# Patient Record
Sex: Female | Born: 1995 | Race: White | Hispanic: No | Marital: Single | Smoking: Former smoker
Health system: Southern US, Community
[De-identification: ages and names within clinical notes are randomized; demographics above are authoritative.]

## PROBLEM LIST (undated history)

## (undated) ENCOUNTER — Emergency Department (HOSPITAL_COMMUNITY): Admission: EM | Payer: Self-pay | Source: Home / Self Care

## (undated) DIAGNOSIS — E063 Autoimmune thyroiditis: Secondary | ICD-10-CM

## (undated) DIAGNOSIS — R7303 Prediabetes: Secondary | ICD-10-CM

## (undated) DIAGNOSIS — R1013 Epigastric pain: Secondary | ICD-10-CM

## (undated) DIAGNOSIS — O24419 Gestational diabetes mellitus in pregnancy, unspecified control: Secondary | ICD-10-CM

## (undated) DIAGNOSIS — R232 Flushing: Secondary | ICD-10-CM

## (undated) DIAGNOSIS — E049 Nontoxic goiter, unspecified: Secondary | ICD-10-CM

## (undated) DIAGNOSIS — J45909 Unspecified asthma, uncomplicated: Secondary | ICD-10-CM

## (undated) DIAGNOSIS — E039 Hypothyroidism, unspecified: Secondary | ICD-10-CM

## (undated) DIAGNOSIS — L906 Striae atrophicae: Secondary | ICD-10-CM

## (undated) HISTORY — DX: Gestational diabetes mellitus in pregnancy, unspecified control: O24.419

## (undated) HISTORY — PX: NO PAST SURGERIES: SHX2092

## (undated) HISTORY — DX: Epigastric pain: R10.13

## (undated) HISTORY — DX: Flushing: R23.2

## (undated) HISTORY — DX: Hypothyroidism, unspecified: E03.9

## (undated) HISTORY — DX: Striae atrophicae: L90.6

## (undated) HISTORY — DX: Unspecified asthma, uncomplicated: J45.909

## (undated) HISTORY — DX: Prediabetes: R73.03

## (undated) HISTORY — DX: Autoimmune thyroiditis: E06.3

## (undated) HISTORY — DX: Nontoxic goiter, unspecified: E04.9

---
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
## 2017-06-08 ENCOUNTER — Emergency Department (HOSPITAL_COMMUNITY)
Admission: EM | Admit: 2017-06-08 | Discharge: 2017-06-08 | Disposition: A | Discharge status: Home / Self Care | Payer: Self-pay | Attending: Emergency Medicine | Admitting: Emergency Medicine

## 2017-06-08 ENCOUNTER — Encounter (HOSPITAL_COMMUNITY): Payer: Self-pay

## 2017-06-08 ENCOUNTER — Other Ambulatory Visit: Payer: Self-pay

## 2017-06-08 DIAGNOSIS — F17210 Nicotine dependence, cigarettes, uncomplicated: Secondary | ICD-10-CM | POA: Insufficient documentation

## 2017-06-08 DIAGNOSIS — R69 Illness, unspecified: Secondary | ICD-10-CM

## 2017-06-08 DIAGNOSIS — R110 Nausea: Secondary | ICD-10-CM | POA: Insufficient documentation

## 2017-06-08 DIAGNOSIS — R070 Pain in throat: Secondary | ICD-10-CM | POA: Insufficient documentation

## 2017-06-08 DIAGNOSIS — Z79899 Other long term (current) drug therapy: Secondary | ICD-10-CM | POA: Insufficient documentation

## 2017-06-08 DIAGNOSIS — R509 Fever, unspecified: Secondary | ICD-10-CM | POA: Insufficient documentation

## 2017-06-08 DIAGNOSIS — R0789 Other chest pain: Secondary | ICD-10-CM | POA: Insufficient documentation

## 2017-06-08 DIAGNOSIS — J111 Influenza due to unidentified influenza virus with other respiratory manifestations: Secondary | ICD-10-CM | POA: Insufficient documentation

## 2017-06-08 NOTE — Discharge Instructions (Signed)
Take Tylenol or Advil as directed for temperature higher than 100.4 or for aches.Make sure that you drink at least six 8 ounce glasses of water each day in order to stay well-hydrated.  [facility] tomorrow to get a primary care physician  and to arrange to be seen if not continue to improve or if you still have fever in 2 or 3 days.  Ask your new primary care physician to help you to stop smoking

## 2017-06-08 NOTE — ED Provider Notes (Signed)
[facility] EMERGENCY DEPARTMENT Provider Note   CSN: [id number] Arrival date & time: 06/08/17  1436     History   Chief Complaint Chief Complaint  Patient presents with  . Cough    HPI Gloria Cox is a 22 y.o. female.  HPI Complains of sore throat cough nausea and fever  onset 3 days ago.  She feels improved today over yesterday.  She complains of chest pain when she coughs.  She denies shortness of breath.  She is not nauseated presently.  She  treated herself with  approximately an hour prior to coming here.  Maximum temperature approximately 101 degrees.  No other associated symptoms.  No vomiting.  Chest pain is worse with coughing.  No chest pain when she does not cough. Past Medical History:  Diagnosis Date  . Dyspepsia   . Goiter   . Plethora   . Pre-diabetes   . Striae   . Thyroiditis, autoimmune     Patient Active Problem List   Diagnosis Date Noted  . Transaminitis 12/17/2015  . Cholelithiasis 12/17/2015  . RUQ abdominal pain 12/17/2015  . Cholelithiases 12/17/2015  . 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 Migraine without aura and without status migrainosus, not intractable 02/14/2015  . Tension headache 02/14/2015  . Presence of subdermal contraceptive implant 11/28/2014  . Adjustment disorder with mixed anxiety and depressed mood 07/18/2014  . Juvenile mammary hypertrophy 04/24/2014  . Macromastia 02/20/2014  . Hyperlipemia 05/09/2013  . Dysthymia 08/19/2011  . Morbid obesity (HCC) 04/20/2011  . Pre-diabetes 07/07/2010    Past Surgical History:  Procedure Laterality Date  . CHOLECYSTECTOMY N/A 12/18/2015   Procedure: LAPAROSCOPIC CHOLECYSTECTOMY;  Surgeon: Emelia Loron, MD;  Location: [facility];  Service: General;  Laterality: N/A;  . NO PAST SURGERIES    . WISDOM TOOTH EXTRACTION  2014    OB History    No data available       Home Medications    Prior to Admission medications   Medication Sig Start Date End Date  Taking? Authorizing Provider  etonogestrel (NEXPLANON) 68 MG IMPL implant 1 each by Subdermal route once. Placed 04/2014    [provider]  ibuprofen (ADVIL,MOTRIN) 800 MG tablet Take 1 tablet (800 mg total) by mouth every 8 (eight) hours as needed. 12/19/16   Tilden Fossa, MD  budesonide (PULMICORT) 180 MCG/ACT inhaler Inhale 2 puffs into the lungs as needed.  06/14/11  [provider]  loratadine (CLARITIN) 10 MG tablet Take 10 mg by mouth daily.  06/14/11  [provider]    Family History Family History  Problem Relation Age of Onset  . Obesity Mother   . Obesity Sister   . Obesity Brother   . Obesity Maternal Grandmother   . Heart Problems Maternal Grandfather 24    Social History Social History   Tobacco Use  . Smoking status: Current Some Day Smoker    Packs/day: 0.10    Types: Cigarettes  . Smokeless tobacco: Never Used  Substance Use Topics  . Alcohol use: No  . Drug use: No     Allergies   Patient has no known allergies.   Review of Systems Review of Systems  Constitutional: Positive for fever.  Respiratory: Positive for cough.   Cardiovascular: Positive for chest pain.  Gastrointestinal: Positive for nausea.  Genitourinary:       Menses irregular  Musculoskeletal: Positive for myalgias.  All other systems reviewed and are negative.    Physical Exam  Updated Vital Signs BP 123/68 (BP Location: Right Arm)   Pulse 75   Temp 98.6 F (37 C) (Oral)   Resp 16   Ht 5\' 3"  (1.6 m)   Wt 88.5 kg (195 lb)   LMP 05/18/2017   SpO2 97%   BMI 34.54 kg/m   Physical Exam  Constitutional: She appears well-developed and well-nourished. No distress.  HENT:  Head: Normocephalic and atraumatic.  Right Ear: External ear normal.  Left Ear: External ear normal.  Oropharynx minimally reddened.  Mucous membranes moist  Eyes: Conjunctivae are normal. Pupils are equal, round, and reactive to light.  Neck: Neck supple. No tracheal deviation  present. No thyromegaly present.  Cardiovascular: Normal rate and regular rhythm.  No murmur heard. Pulmonary/Chest: Effort normal and breath sounds normal.  Abdominal: Soft. Bowel sounds are normal. She exhibits no distension. There is no tenderness.  Obese  Musculoskeletal: Normal range of motion. She exhibits no edema or tenderness.  Neurological: She is alert. Coordination normal.  Skin: Skin is warm and dry. No rash noted.  Psychiatric: She has a normal mood and affect.  Nursing note and vitals reviewed.    ED Treatments / Results  Labs (all labs ordered are listed, but only abnormal results are displayed) Labs Reviewed - No data to display  EKG  EKG Interpretation None       Radiology No results found.  Procedures Procedures (including critical care time)  Medications Ordered in ED Medications - No data to display   Initial Impression / Assessment and Plan / ED Course  I have reviewed the triage vital signs and the nursing notes.  Pertinent labs & imaging results that were available during my care of the patient were reviewed by me and considered in my medical decision making (see chart for details).     Patient with influenza-like illness clinically.  Further diagnostic testing not indicated.  Plan Tylenol or Advil for aches or for temperature.  I counseled patient for 5 minutes on smoking cessation.  [facility]erral [facility]  Final Clinical Impressions(s) / ED Diagnoses  Diagnosis #1 influenza-like illness #2 tobacco abuse Final diagnoses:  None    ED Discharge Orders    None       Doug SouJacubowitz, Itai Barbian, MD 06/08/17 [phone]

## 2017-06-08 NOTE — ED Triage Notes (Signed)
Patient c/o cough, fever to 100, nausea x 4days - improving today.  Patient states she vomited yesterday but denies vomiting today.  Patient states "I feel better today but I needed a note for work".

## 2017-08-16 ENCOUNTER — Emergency Department (HOSPITAL_COMMUNITY)
Admission: EM | Admit: 2017-08-16 | Discharge: 2017-08-16 | Disposition: A | Discharge status: Home / Self Care | Payer: Self-pay | Attending: Emergency Medicine | Admitting: Emergency Medicine

## 2017-08-16 ENCOUNTER — Encounter (HOSPITAL_COMMUNITY): Payer: Self-pay

## 2017-08-16 DIAGNOSIS — F17210 Nicotine dependence, cigarettes, uncomplicated: Secondary | ICD-10-CM | POA: Insufficient documentation

## 2017-08-16 DIAGNOSIS — A549 Gonococcal infection, unspecified: Secondary | ICD-10-CM | POA: Insufficient documentation

## 2017-08-16 DIAGNOSIS — Z79899 Other long term (current) drug therapy: Secondary | ICD-10-CM | POA: Insufficient documentation

## 2017-08-16 DIAGNOSIS — N390 Urinary tract infection, site not specified: Secondary | ICD-10-CM | POA: Insufficient documentation

## 2017-08-16 MED ORDER — DOXYCYCLINE HYCLATE 100 MG PO CAPS: 100.0000 mg | ORAL_CAPSULE | Freq: Two times a day (BID) | ORAL | 0 refills | Status: DC

## 2017-08-16 MED ORDER — CEPHALEXIN 500 MG PO CAPS: 500.0000 mg | ORAL_CAPSULE | Freq: Three times a day (TID) | ORAL | 0 refills | Status: DC

## 2017-08-16 NOTE — ED Triage Notes (Signed)
PT states she was seen at [facility] last week and dx with BV, gonorrhea, and received phone call yesterday and was told she also had a bad UTI. IT states she is still having bad back and lower abdomen pain. Denies n/v.

## 2017-08-16 NOTE — Discharge Instructions (Addendum)
Take both antibiotics as prescribed until all gone.  Please follow-up after he finished antibiotics if continue to have pain.

## 2017-08-16 NOTE — ED Provider Notes (Signed)
[facility] EMERGENCY DEPARTMENT Provider Note   CSN: [id number] Arrival date & time: 08/16/17  1404     History   Chief Complaint Chief Complaint  Patient presents with  . Urinary Tract Infection    HPI Gloria Cox is a 22 y.o. female.  HPI Gloria Cox is a 22 y.o. female presents to ED with complaint of pelvic pain and back pain.  Patient states she has had pelvic pain for several weeks.  She went to [facility] urgent care last week, had pelvic exam and urinalysis done which she states that she was called back and told she tested positive for gonorrhea and had a urinary tract infection.  She received a shot of Rocephin, but did not fill her prescription for doxycycline and antibiotic she was given for UTI.  She states she still having suprapubic pain that radiates into the back.  She denies any fever or chills.  No nausea or vomiting.  Denies any urinary symptoms.  Denies any vaginal symptoms.  Denies any other complaints.  Past Medical History:  Diagnosis Date  . Dyspepsia   . Goiter   . Plethora   . Pre-diabetes   . Striae   . Thyroiditis, autoimmune     Patient Active Problem List   Diagnosis Date Noted  . Transaminitis 12/17/2015  . Cholelithiasis 12/17/2015  . RUQ abdominal pain 12/17/2015  . Cholelithiases 12/17/2015  . 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 Migraine without aura and without status migrainosus, not intractable 02/14/2015  . Tension headache 02/14/2015  . Presence of subdermal contraceptive implant 11/28/2014  . Adjustment disorder with mixed anxiety and depressed mood 07/18/2014  . Juvenile mammary hypertrophy 04/24/2014  . Macromastia 02/20/2014  . Hyperlipemia 05/09/2013  . Dysthymia 08/19/2011  . Morbid obesity (HCC) 04/20/2011  . Pre-diabetes 07/07/2010    Past Surgical History:  Procedure Laterality Date  . CHOLECYSTECTOMY N/A 12/18/2015   Procedure: LAPAROSCOPIC CHOLECYSTECTOMY;  Surgeon: Emelia Loron, MD;  Location: [facility]S;  Service: General;  Laterality: N/A;  . NO PAST SURGERIES    . WISDOM TOOTH EXTRACTION  2014     OB History   None      Home Medications    Prior to Admission medications   Medication Sig Start Date End Date Taking? Authorizing Provider  etonogestrel (NEXPLANON) 68 MG IMPL implant 1 each by Subdermal route once. Placed 04/2014    [provider]  ibuprofen (ADVIL,MOTRIN) 800 MG tablet Take 1 tablet (800 mg total) by mouth every 8 (eight) hours as needed. 12/19/16   Tilden Fossa, MD  budesonide (PULMICORT) 180 MCG/ACT inhaler Inhale 2 puffs into the lungs as needed.  06/14/11  [provider]  loratadine (CLARITIN) 10 MG tablet Take 10 mg by mouth daily.  06/14/11  [provider]    Family History Family History  Problem Relation Age of Onset  . Obesity Mother   . Obesity Sister   . Obesity Brother   . Obesity Maternal Grandmother   . Heart Problems Maternal Grandfather 29    Social History Social History   Tobacco Use  . Smoking status: Current Some Day Smoker    Packs/day: 0.10    Types: Cigarettes  . Smokeless tobacco: Never Used  Substance Use Topics  . Alcohol use: No  . Drug use: No     Allergies   Patient has no known allergies.   Review of Systems Review of Systems  Constitutional: Negative for chills  and fever.  Respiratory: Negative for cough, chest tightness and shortness of breath.   Cardiovascular: Negative for chest pain, palpitations and leg swelling.  Gastrointestinal: Positive for abdominal pain. Negative for diarrhea, nausea and vomiting.  Genitourinary: Positive for flank pain. Negative for dysuria, pelvic pain, vaginal bleeding, vaginal discharge and vaginal pain.  Musculoskeletal: Negative for arthralgias, myalgias, neck pain and neck stiffness.  Skin: Negative for rash.  Neurological: Negative for dizziness, weakness and headaches.  All other systems reviewed and are  negative.    Physical Exam Updated Vital Signs BP 123/78 (BP Location: Right Arm)   Pulse 69   Temp 98.6 F (37 C) (Oral)   Resp 16   SpO2 100%   Physical Exam  Constitutional: She appears well-developed and well-nourished. No distress.  Eyes: Conjunctivae are normal.  Neck: Neck supple.  Cardiovascular: Normal rate.  Pulmonary/Chest: Effort normal and breath sounds normal.  Abdominal: She exhibits no distension. There is no tenderness. There is no guarding.  No CVA tenderness  Neurological: She is alert.  Skin: Skin is warm and dry.  Nursing note and vitals reviewed.    ED Treatments / Results  Labs (all labs ordered are listed, but only abnormal results are displayed) Labs Reviewed - No data to display  EKG None  Radiology No results found.  Procedures Procedures (including critical care time)  Medications Ordered in ED Medications - No data to display   Initial Impression / Assessment and Plan / ED Course  I have reviewed the triage vital signs and the nursing notes.  Pertinent labs & imaging results that were available during my care of the patient were reviewed by me and considered in my medical decision making (see chart for details).     Patient continues to have suprapubic pain, recently diagnosed with gonorrhea and UTI.  I reviewed the cultures, grew E. coli, sensitive to all antibiotics.  Also did not take her medication as was prescribed for her gonorrhea.  She is in no acute distress, afebrile, nontoxic-appearing.  Vital signs are normal.  Abdomen is nontender.  No CVA tenderness, I am not concerned about pyelonephritis at this time.  I will prescribe her again doxycycline, and give her a coupon that she can afford, will also prescribe Keflex for her UTI.  We will have her follow-up after she finishes antibiotics.  Return precautions discussed.  Vitals:   08/16/17 1542  BP: 123/78  Pulse: 69  Resp: 16  Temp: 98.6 F (37 C)  TempSrc: Oral   SpO2: 100%    Final Clinical Impressions(s) / ED Diagnoses   Final diagnoses:  Lower urinary tract infectious disease  Gonorrhea    ED Discharge Orders    None       Iona Coach 08/16/17 2037    Marily Memos, MD 08/16/17 2138

## 2017-10-11 ENCOUNTER — Emergency Department (HOSPITAL_COMMUNITY): Payer: Self-pay

## 2017-10-11 ENCOUNTER — Encounter (HOSPITAL_COMMUNITY): Payer: Self-pay | Admitting: Emergency Medicine

## 2017-10-11 ENCOUNTER — Emergency Department (HOSPITAL_COMMUNITY)
Admission: EM | Admit: 2017-10-11 | Discharge: 2017-10-11 | Disposition: A | Discharge status: Home / Self Care | Payer: Self-pay | Attending: Emergency Medicine | Admitting: Emergency Medicine

## 2017-10-11 DIAGNOSIS — R112 Nausea with vomiting, unspecified: Secondary | ICD-10-CM | POA: Insufficient documentation

## 2017-10-11 DIAGNOSIS — F17210 Nicotine dependence, cigarettes, uncomplicated: Secondary | ICD-10-CM | POA: Insufficient documentation

## 2017-10-11 DIAGNOSIS — R197 Diarrhea, unspecified: Secondary | ICD-10-CM | POA: Insufficient documentation

## 2017-10-11 DIAGNOSIS — Z79899 Other long term (current) drug therapy: Secondary | ICD-10-CM | POA: Insufficient documentation

## 2017-10-11 DIAGNOSIS — I880 Nonspecific mesenteric lymphadenitis: Secondary | ICD-10-CM | POA: Insufficient documentation

## 2017-10-11 LAB — URINALYSIS, ROUTINE W REFLEX MICROSCOPIC
BILIRUBIN URINE: NEGATIVE
GLUCOSE, UA: NEGATIVE mg/dL
HGB URINE DIPSTICK: NEGATIVE
KETONES UR: NEGATIVE mg/dL
LEUKOCYTES UA: NEGATIVE
Nitrite: NEGATIVE
PH: 5 (ref 5.0–8.0)
PROTEIN: NEGATIVE mg/dL
Specific Gravity, Urine: 1.024 (ref 1.005–1.030)

## 2017-10-11 LAB — WET PREP, GENITAL
Clue Cells Wet Prep HPF POC: NONE SEEN
Sperm: NONE SEEN
Trich, Wet Prep: NONE SEEN
YEAST WET PREP: NONE SEEN

## 2017-10-11 LAB — I-STAT BETA HCG BLOOD, ED (MC, WL, AP ONLY): I-stat hCG, quantitative: <5 m[IU]/mL (ref <5)

## 2017-10-11 LAB — COMPREHENSIVE METABOLIC PANEL
ALBUMIN: 3.9 g/dL (ref 3.5–5.0)
ALT: 15 U/L (ref 0–44)
AST: 19 U/L (ref 15–41)
Alkaline Phosphatase: 66 U/L (ref 38–126)
Anion gap: 10 (ref 5–15)
BILIRUBIN TOTAL: 0.3 mg/dL (ref 0.3–1.2)
BUN: 8 mg/dL (ref 6–20)
CHLORIDE: 106 mmol/L (ref 98–111)
CO2: 24 mmol/L (ref 22–32)
CREATININE: 0.57 mg/dL (ref 0.44–1.00)
Calcium: 9 mg/dL (ref 8.9–10.3)
GFR calc Af Amer: >60 mL/min (ref >60)
GLUCOSE: 104 mg/dL — AB (ref 70–99)
Potassium: 3.8 mmol/L (ref 3.5–5.1)
Sodium: 140 mmol/L (ref 135–145)
Total Protein: 7.5 g/dL (ref 6.5–8.1)

## 2017-10-11 LAB — CBC
HEMATOCRIT: 41.9 % (ref 36.0–46.0)
Hemoglobin: 14.1 g/dL (ref 12.0–15.0)
MCH: 29.7 pg (ref 26.0–34.0)
MCHC: 33.7 g/dL (ref 30.0–36.0)
MCV: 88.2 fL (ref 78.0–100.0)
PLATELETS: 172 10*3/uL (ref 150–400)
RBC: 4.75 MIL/uL (ref 3.87–5.11)
RDW: 12.3 % (ref 11.5–15.5)
WBC: 6.4 10*3/uL (ref 4.0–10.5)

## 2017-10-11 LAB — LIPASE, BLOOD: LIPASE: 21 U/L (ref 11–51)

## 2017-10-11 LAB — GROUP A STREP BY PCR: GROUP A STREP BY PCR: NOT DETECTED

## 2017-10-11 MED ORDER — FAMOTIDINE IN NACL 20-0.9 MG/50ML-% IV SOLN
20.0000 mg | Freq: Once | INTRAVENOUS | Status: AC
  Administered 2017-10-11: 20 mg via INTRAVENOUS
  Filled 2017-10-11: qty 50

## 2017-10-11 MED ORDER — OXYCODONE-ACETAMINOPHEN 5-325 MG PO TABS: 1.0000 | ORAL_TABLET | Freq: Four times a day (QID) | ORAL | 0 refills | Status: DC | PRN

## 2017-10-11 MED ORDER — MORPHINE SULFATE (PF) 4 MG/ML IV SOLN
4.0000 mg | Freq: Once | INTRAVENOUS | Status: AC
  Administered 2017-10-11: 4 mg via INTRAVENOUS
  Filled 2017-10-11: qty 1

## 2017-10-11 MED ORDER — ONDANSETRON HCL 4 MG/2ML IJ SOLN
4.0000 mg | Freq: Once | INTRAMUSCULAR | Status: AC
  Administered 2017-10-11: 4 mg via INTRAVENOUS
  Filled 2017-10-11: qty 2

## 2017-10-11 MED ORDER — DICYCLOMINE HCL 10 MG PO CAPS
20.0000 mg | ORAL_CAPSULE | Freq: Once | ORAL | Status: AC
  Administered 2017-10-11: 20 mg via ORAL
  Filled 2017-10-11: qty 2

## 2017-10-11 MED ORDER — IOPAMIDOL (ISOVUE-300) INJECTION 61%
INTRAVENOUS | Status: AC
  Filled 2017-10-11: qty 100

## 2017-10-11 MED ORDER — SODIUM CHLORIDE 0.9 % IV BOLUS
1000.0000 mL | Freq: Once | INTRAVENOUS | Status: AC
  Administered 2017-10-11: 1000 mL via INTRAVENOUS

## 2017-10-11 MED ORDER — ONDANSETRON 4 MG PO TBDP: 4.0000 mg | ORAL_TABLET | Freq: Three times a day (TID) | ORAL | 0 refills | Status: DC | PRN

## 2017-10-11 MED ORDER — NAPROXEN 500 MG PO TABS
500.0000 mg | ORAL_TABLET | Freq: Two times a day (BID) | ORAL | 0 refills | Status: DC
Start: 2017-10-11 — End: 2018-03-09

## 2017-10-11 MED ORDER — IOPAMIDOL (ISOVUE-300) INJECTION 61%
100.0000 mL | Freq: Once | INTRAVENOUS | Status: AC | PRN
  Administered 2017-10-11: 100 mL via INTRAVENOUS

## 2017-10-11 MED ORDER — GI COCKTAIL ~~LOC~~
30.0000 mL | Freq: Once | ORAL | Status: AC
  Administered 2017-10-11: 30 mL via ORAL
  Filled 2017-10-11: qty 30

## 2017-10-11 NOTE — ED Notes (Signed)
Urine culture sent to lab with UA sample 

## 2017-10-11 NOTE — Discharge Instructions (Signed)
You are seen in the emergency department today for abdominal pain and found to have mesenteric adenitis, please see the attached handout regarding this diagnosis, we suspect this is from the viral GI illness.  We are Sending you home with supportive care.  Please take naproxen every 12 hours for pain. Naproxen is a nonsteroidal anti-inflammatory medication that will help with pain and swelling. Be sure to take this medication as prescribed with food, 1 pill every 12 hours,  It should be taken with food, as it can cause stomach upset, and more seriously, stomach bleeding. Do not take other nonsteroidal anti-inflammatory medications with this such as Advil, Motrin, or Aleve.   We also send you home with a few tablets of Percocet, please only utilize these every 6 hours as needed for severe pain.  The redness medicine has Tylenol in it, therefore you will need to avoid exceeding the maximum dose of Tylenol should you decide to supplement with this.  Do not drive or operate heavy machinery when taking this medicine.  Do not mix this medicine with alcohol.  Keep this out of reach of small children.  We are also sending you home with a prescription for Zofran, take this every 8 hours as needed for nausea and vomiting.  We have prescribed you new medication(s) today. Discuss the medications prescribed today with your pharmacist as they can have adverse effects and interactions with your other medicines including over the counter and prescribed medications. Seek medical evaluation if you start to experience new or abnormal symptoms after taking one of these medicines, seek care immediately if you start to experience difficulty breathing, feeling of your throat closing, facial swelling, or rash as these could be indications of a more serious allergic reaction   Follow-up with your primary care provider within 3 days for reevaluation.  Return to the ER anytime for new or worsening symptoms such as worsening pain,  inability to keep fluids down, fever, blood in your stool, blood in vomit, or any other concerns.

## 2017-10-11 NOTE — ED Provider Notes (Signed)
[facility]-EMERGENCY DEPT Provider Note   CSN: [id number] Arrival date & time: 10/11/17  1219     History   Chief Complaint Chief Complaint  Patient presents with  . Emesis  . Diarrhea  . Abdominal Pain    HPI Gloria Cox is a 22 y.o. female with a hx of tobacco abuse, autoimmune thyroiditis, hyperlipidemia, obesity, and cholecystectomy who presents to the ED with complaints of N/V/D and abdominal pain for the past 4 days. Patient states she initially developed nausea then subsequently vomiting, diarrhea, and abdominal pain. Emesis and diarrhea are each non bloody, non mucousy. Reports approximately 4-5 episodes of vomiting daily and too numerous to count episodes of diarrhea daily. Her abdominal pain is described as generalized, crampy, worse with emesis and diarrhea, no specific alleviating factors. She has not been able to keep down food/fluids. She has also had some congestion, R ear pain, and sore throat as well. 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  She states she has also had some odorous vaginal discharge for about 1 week- she states she was treated for gonorrhea about 1 month ago with improvement in sxs, but now having some odorous discharge again, other than this tx no other recent abx. Reports she has irregular periods with nexplanon in place, currently having vaginal bleeding. She is sexually active. Denies chest pain, dyspnea, or dysuria. No recent tic bites or rashes.   HPI  Past Medical History:  Diagnosis Date  . Dyspepsia   . Goiter   . Plethora   . Pre-diabetes   . Striae   . Thyroiditis, autoimmune     Patient Active Problem List   Diagnosis Date Noted  . Transaminitis 12/17/2015  . Cholelithiasis 12/17/2015  . RUQ abdominal pain 12/17/2015  . Cholelithiases 12/17/2015  . 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 Migraine without aura and  without status migrainosus, not intractable 02/14/2015  . Tension headache 02/14/2015  . Presence of subdermal contraceptive implant 11/28/2014  . Adjustment disorder with mixed anxiety and depressed mood 07/18/2014  . Juvenile mammary hypertrophy 04/24/2014  . Macromastia 02/20/2014  . Hyperlipemia 05/09/2013  . Dysthymia 08/19/2011  . Morbid obesity (HCC) 04/20/2011  . Pre-diabetes 07/07/2010    Past Surgical History:  Procedure Laterality Date  . CHOLECYSTECTOMY N/A 12/18/2015   Procedure: LAPAROSCOPIC CHOLECYSTECTOMY;  Surgeon: Emelia Loron, MD;  Location: [facility];  Service: General;  Laterality: N/A;  . NO PAST SURGERIES    . WISDOM TOOTH EXTRACTION  2014     OB History   None      Home Medications    Prior to Admission medications   Medication Sig Start Date End Date Taking? Authorizing Provider  cephALEXin (KEFLEX) 500 MG capsule Take 1 capsule (500 mg total) by mouth 3 (three) times daily. 08/16/17   Kirichenko, Lemont Fillers, PA-C  doxycycline (VIBRAMYCIN) 100 MG capsule Take 1 capsule (100 mg total) by mouth 2 (two) times daily. 08/16/17   Kirichenko, Tatyana, PA-C  etonogestrel (NEXPLANON) 68 MG IMPL implant 1 each by Subdermal route once. Placed 04/2014    [provider]  ibuprofen (ADVIL,MOTRIN) 800 MG tablet Take 1 tablet (800 mg total) by mouth every 8 (eight) hours as needed. 12/19/16   Tilden Fossa, MD  budesonide (PULMICORT) 180 MCG/ACT inhaler Inhale 2 puffs into the lungs as needed.  06/14/11  [provider]  loratadine (CLARITIN) 10 MG tablet  Take 10 mg by mouth daily.  06/14/11  [provider]    Family History Family History  Problem Relation Age of Onset  . Obesity Mother   . Obesity Sister   . Obesity Brother   . Obesity Maternal Grandmother   . Heart Problems Maternal Grandfather 99    Social History Social History   Tobacco Use  . Smoking status: Current Some Day Smoker    Packs/day: 0.10    Types: Cigarettes    . Smokeless tobacco: Never Used  Substance Use Topics  . Alcohol use: No  . Drug use: No     Allergies   Patient has no known allergies.   Review of Systems Review of Systems  Constitutional: Positive for chills and fever.  HENT: Positive for congestion, ear pain and sore throat.   Respiratory: Negative for shortness of breath.   Cardiovascular: Negative for chest pain.  Gastrointestinal: Positive for abdominal pain, diarrhea, nausea and vomiting. Negative for blood in stool.  Genitourinary: Positive for vaginal bleeding and vaginal discharge. Negative for dysuria and flank pain.  Skin: Negative for rash.  All other systems reviewed and are negative.    Physical Exam Updated Vital Signs BP 123/77 (BP Location: Left Arm)   Pulse 82   Temp 98.2 F (36.8 C) (Oral)   Resp 16   Ht 5\' 3"  (1.6 m)   Wt 88.5 kg (195 lb)   LMP 09/26/2017   SpO2 100%   BMI 34.54 kg/m   Physical Exam  Constitutional: She appears well-developed and well-nourished.  Non-toxic appearance. No distress.  HENT:  Head: Normocephalic and atraumatic.  Right Ear: Tympanic membrane and ear canal normal. Tympanic membrane is not perforated, not erythematous, not retracted and not bulging.  Left Ear: Tympanic membrane and ear canal normal. Tympanic membrane is not perforated, not erythematous, not retracted and not bulging.  Nose: Mucosal edema (congestion) present.  Mouth/Throat: Uvula is midline. Posterior oropharyngeal erythema present. No oropharyngeal exudate.  Patient is tolerating her own secretions without difficulty. No trismus. No drooling. No hot potato voice. Submandibular compartment is soft.   Eyes: Pupils are equal, round, and reactive to light. Conjunctivae are normal. Right eye exhibits no discharge. Left eye exhibits no discharge.  Neck: Normal range of motion. Neck supple.  Cardiovascular: Normal rate and regular rhythm.  No murmur heard. Pulmonary/Chest: Effort normal and breath sounds  normal. No respiratory distress. She has no wheezes. She has no rales.  Abdominal: Soft. Bowel sounds are normal. She exhibits no distension. There is generalized tenderness (mild, non focal). There is no rigidity, no rebound, no guarding and no CVA tenderness.  Genitourinary: Pelvic exam was performed with patient supine. There is no lesion on the right labia. There is no lesion on the left labia. Cervix exhibits discharge (Mild white). Cervix exhibits no motion tenderness and no friability. Right adnexum displays no mass, no tenderness and no fullness. Left adnexum displays no mass, no tenderness and no fullness. There is bleeding (mild amount from cervical os) in the vagina.  Genitourinary Comments: EDT Fleet Contras present as chaperone.   Lymphadenopathy:    She has cervical adenopathy (mild anterior).  Neurological: She is alert.  Clear speech.   Skin: Skin is warm and dry. No rash noted.  Psychiatric: She has a normal mood and affect. Her behavior is normal.  Nursing note and vitals reviewed.   ED Treatments / Results  Labs Results for orders placed or performed during the hospital encounter of 10/11/17  Wet prep, genital  Result Value Ref Range   Yeast Wet Prep HPF POC NONE SEEN NONE SEEN   Trich, Wet Prep NONE SEEN NONE SEEN   Clue Cells Wet Prep HPF POC NONE SEEN NONE SEEN   WBC, Wet Prep HPF POC FEW (A) NONE SEEN   Sperm NONE SEEN   Group A Strep by PCR  Result Value Ref Range   Group A Strep by PCR NOT DETECTED NOT DETECTED  Lipase, blood  Result Value Ref Range   Lipase 21 11 - 51 U/L  Comprehensive metabolic panel  Result Value Ref Range   Sodium 140 135 - 145 mmol/L   Potassium 3.8 3.5 - 5.1 mmol/L   Chloride 106 98 - 111 mmol/L   CO2 24 22 - 32 mmol/L   Glucose, Bld 104 (H) 70 - 99 mg/dL   BUN 8 6 - 20 mg/dL   Creatinine, Ser 1.61 0.44 - 1.00 mg/dL   Calcium 9.0 8.9 - 09.6 mg/dL   Total Protein 7.5 6.5 - 8.1 g/dL   Albumin 3.9 3.5 - 5.0 g/dL   AST 19 15 - 41 U/L    ALT 15 0 - 44 U/L   Alkaline Phosphatase 66 38 - 126 U/L   Total Bilirubin 0.3 0.3 - 1.2 mg/dL   GFR calc non Af Amer >60 >60 mL/min   GFR calc Af Amer >60 >60 mL/min   Anion gap 10 5 - 15  CBC  Result Value Ref Range   WBC 6.4 4.0 - 10.5 K/uL   RBC 4.75 3.87 - 5.11 MIL/uL   Hemoglobin 14.1 12.0 - 15.0 g/dL   HCT 04.5 40.9 - 81.1 %   MCV 88.2 78.0 - 100.0 fL   MCH 29.7 26.0 - 34.0 pg   MCHC 33.7 30.0 - 36.0 g/dL   RDW 91.4 78.2 - 95.6 %   Platelets 172 150 - 400 K/uL  Urinalysis, Routine w reflex microscopic  Result Value Ref Range   Color, Urine YELLOW YELLOW   APPearance CLEAR CLEAR   Specific Gravity, Urine 1.024 1.005 - 1.030   pH 5.0 5.0 - 8.0   Glucose, UA NEGATIVE NEGATIVE mg/dL   Hgb urine dipstick NEGATIVE NEGATIVE   Bilirubin Urine NEGATIVE NEGATIVE   Ketones, ur NEGATIVE NEGATIVE mg/dL   Protein, ur NEGATIVE NEGATIVE mg/dL   Nitrite NEGATIVE NEGATIVE   Leukocytes, UA NEGATIVE NEGATIVE  I-Stat beta hCG blood, ED  Result Value Ref Range   I-stat hCG, quantitative <5.0 <5 mIU/mL   Comment 3            EKG None  Radiology Ct Abdomen Pelvis W Contrast  Result Date: 10/11/2017 CLINICAL DATA:  Abdominal pain with nausea and vomiting starting on Wednesday. EXAM: CT ABDOMEN AND PELVIS WITH CONTRAST TECHNIQUE: Multidetector CT imaging of the abdomen and pelvis was performed using the standard protocol following bolus administration of intravenous contrast. CONTRAST:  ISOVUE-300 IOPAMIDOL (ISOVUE-300) INJECTION 61% COMPARISON:  Multiple exams, including 12/17/2015 MRI FINDINGS: Lower chest: Unremarkable Hepatobiliary: Cholecystectomy. Common bile duct 5 mm in diameter, previously 4 mm diameter, slight prominence likely a physiologic response to cholecystectomy. Pancreas: Unremarkable Spleen: Unremarkable Adrenals/Urinary Tract: Unremarkable Stomach/Bowel: Unremarkable.  Normal appendix. Vascular/Lymphatic: Mildly prominent central mesenteric lymph nodes including a 9  mm mesenteric node on image 41/2. No overt pathologic adenopathy. Reproductive: Unremarkable Other: No supplemental non-categorized findings. Musculoskeletal: Unremarkable IMPRESSION: 1. Mild prominence of central mesenteric lymph nodes raise the possibility of mesenteric adenitis or reactive lymph nodes.  The appendix appears normal. Electronically Signed   By: Gaylyn RongWalter  Liebkemann M.D.   On: 10/11/2017 20:07    Procedures Procedures (including critical care time)  Medications Ordered in ED Medications  iopamidol (ISOVUE-300) 61 % injection (has no administration in time range)  sodium chloride 0.9 % bolus 1,000 mL (0 mLs Intravenous Stopped 10/11/17 1810)  ondansetron (ZOFRAN) injection 4 mg (4 mg Intravenous Given 10/11/17 1702)  famotidine (PEPCID) IVPB 20 mg premix (0 mg Intravenous Stopped 10/11/17 1720)  dicyclomine (BENTYL) capsule 20 mg (20 mg Oral Given 10/11/17 1703)  gi cocktail (Maalox,Lidocaine,Donnatal) (30 mLs Oral Given 10/11/17 1803)  morphine 4 MG/ML injection 4 mg (4 mg Intravenous Given 10/11/17 1850)  iopamidol (ISOVUE-300) 61 % injection 100 mL (100 mLs Intravenous Contrast Given 10/11/17 1942)     Initial Impression / Assessment and Plan / ED Course  I have reviewed the triage vital signs and the nursing notes.  Pertinent labs & imaging results that were available during my care of the patient were reviewed by me and considered in my medical decision making (see chart for details).   Patient presents to the emergency department with complaints of nausea, vomiting, and diarrhea with associated abdominal pain.  Patient nontoxic-appearing, no apparent distress, initial tachycardia in triage normalized on my exam, vitals otherwise WNL.  Patient has mild diffuse abdominal tenderness to palpation, nonfocal, no peritoneal signs. Pelvic exam performed- non tender, minimal discharge, doubt PID or TBO abscess.  Will initiate work-up with labs and administer IV fluids, Zofran, Pepcid, and  Bentyl.   Patient's labs have been reviewed and are grossly unremarkable.  No leukocytosis.  No anemia.  No significant electrolyte abnormalities.  Lipase, renal function, and LFTs are all WNL.  Urine is without infection.  Pregnancy test negative.  17:45: RE-EVAL: Patient's nausea has improved, minimal change in her abdominal discomfort, will trial GI cocktail.  18:45:RE-EVAL: Patient tearful, remains uncomfortable, given lack of improvement with multiple medications will obtain CT abdomen/pelvis for further evaluation.  Morphine ordered.   CT abdomen pelvis reveals mild prominence of central mesenteric lymph nodes raise the possibility of mesenteric adenitis or reactive lymph nodes. The appendix appears normal.  No evidence of bowel obstruction or perforation.  On reevaluation patient is resting comfortably, no peritoneal signs.  Given CT with possible mesenteric adenitis, suspect possibly reactive from viral GI illness, will treat supportively with zofran, naproxen, and percocet for severe pain. [place] Controlled Substance reporting System queried. Diet recommendations provided. I discussed results, treatment plan, need for PCP follow-up, and return precautions with the patient and her mother at bedside. Provided opportunity for questions, patient confirmed understanding and is in agreement with plan.   Findings and plan of care discussed with supervising physician Dr. Erma HeritageIsaacs, in agreement.   Vitals:   10/11/17 2030 10/11/17 2115  BP: 110/69 111/70  Pulse: 66 66  Resp:  16  Temp:  98.3 F (36.8 C)  SpO2: 99% 100%   Final Clinical Impressions(s) / ED Diagnoses   Final diagnoses:  Mesenteric adenitis  Nausea vomiting and diarrhea    ED Discharge Orders        Ordered    oxyCODONE-acetaminophen (PERCOCET/ROXICET) 5-325 MG tablet  Every 6 hours PRN     10/11/17 2055    ondansetron (ZOFRAN ODT) 4 MG disintegrating tablet  Every 8 hours PRN     10/11/17 2055    naproxen  (NAPROSYN) 500 MG tablet  2 times daily     10/11/17 2055  Desmond Lope 10/11/17 2309    Shaune Pollack, MD 10/12/17 [phone]

## 2017-10-11 NOTE — ED Triage Notes (Signed)
Pt c/o abd pain with n/v/d since last Wed-Thursday.

## 2017-10-12 LAB — RPR: RPR: NONREACTIVE

## 2017-10-12 LAB — GC/CHLAMYDIA PROBE AMP (~~LOC~~) NOT AT ARMC
CHLAMYDIA, DNA PROBE: NEGATIVE
NEISSERIA GONORRHEA: NEGATIVE

## 2017-10-12 LAB — HIV ANTIBODY (ROUTINE TESTING W REFLEX): HIV Screen 4th Generation wRfx: NONREACTIVE

## 2018-03-09 ENCOUNTER — Encounter (HOSPITAL_COMMUNITY): Payer: Self-pay

## 2018-03-09 ENCOUNTER — Emergency Department (HOSPITAL_COMMUNITY)
Admission: EM | Admit: 2018-03-09 | Discharge: 2018-03-09 | Disposition: A | Discharge status: Home / Self Care | Payer: Self-pay | Attending: Emergency Medicine | Admitting: Emergency Medicine

## 2018-03-09 ENCOUNTER — Other Ambulatory Visit: Payer: Self-pay

## 2018-03-09 DIAGNOSIS — Z9049 Acquired absence of other specified parts of digestive tract: Secondary | ICD-10-CM | POA: Insufficient documentation

## 2018-03-09 DIAGNOSIS — F4323 Adjustment disorder with mixed anxiety and depressed mood: Secondary | ICD-10-CM | POA: Insufficient documentation

## 2018-03-09 DIAGNOSIS — Z87891 Personal history of nicotine dependence: Secondary | ICD-10-CM | POA: Insufficient documentation

## 2018-03-09 DIAGNOSIS — R102 Pelvic and perineal pain: Secondary | ICD-10-CM | POA: Insufficient documentation

## 2018-03-09 DIAGNOSIS — Z79899 Other long term (current) drug therapy: Secondary | ICD-10-CM | POA: Insufficient documentation

## 2018-03-09 LAB — COMPREHENSIVE METABOLIC PANEL
ALT: 13 U/L (ref 0–44)
ANION GAP: 8 (ref 5–15)
AST: 15 U/L (ref 15–41)
Albumin: 4.3 g/dL (ref 3.5–5.0)
Alkaline Phosphatase: 67 U/L (ref 38–126)
BILIRUBIN TOTAL: 0.6 mg/dL (ref 0.3–1.2)
BUN: 11 mg/dL (ref 6–20)
CALCIUM: 9.1 mg/dL (ref 8.9–10.3)
CHLORIDE: 108 mmol/L (ref 98–111)
CO2: 24 mmol/L (ref 22–32)
Creatinine, Ser: 0.54 mg/dL (ref 0.44–1.00)
GFR calc non Af Amer: >60 mL/min (ref >60)
Glucose, Bld: 102 mg/dL — ABNORMAL HIGH (ref 70–99)
POTASSIUM: 4.3 mmol/L (ref 3.5–5.1)
Sodium: 140 mmol/L (ref 135–145)
TOTAL PROTEIN: 7.5 g/dL (ref 6.5–8.1)

## 2018-03-09 LAB — CBC
HEMATOCRIT: 42 % (ref 36.0–46.0)
Hemoglobin: 13.8 g/dL (ref 12.0–15.0)
MCH: 29.9 pg (ref 26.0–34.0)
MCHC: 32.9 g/dL (ref 30.0–36.0)
MCV: 91.1 fL (ref 80.0–100.0)
Platelets: 197 10*3/uL (ref 150–400)
RBC: 4.61 MIL/uL (ref 3.87–5.11)
RDW: 12.3 % (ref 11.5–15.5)
WBC: 8.2 10*3/uL (ref 4.0–10.5)
nRBC: 0 % (ref 0.0–0.2)

## 2018-03-09 LAB — URINALYSIS, ROUTINE W REFLEX MICROSCOPIC
Bacteria, UA: NONE SEEN
Bilirubin Urine: NEGATIVE
GLUCOSE, UA: NEGATIVE mg/dL
Ketones, ur: NEGATIVE mg/dL
Leukocytes, UA: NEGATIVE
Nitrite: NEGATIVE
PH: 7 (ref 5.0–8.0)
Protein, ur: NEGATIVE mg/dL
Specific Gravity, Urine: 1.016 (ref 1.005–1.030)

## 2018-03-09 LAB — WET PREP, GENITAL
CLUE CELLS WET PREP: NONE SEEN
Sperm: NONE SEEN
Trich, Wet Prep: NONE SEEN
WBC, Wet Prep HPF POC: NONE SEEN
Yeast Wet Prep HPF POC: NONE SEEN

## 2018-03-09 LAB — LIPASE, BLOOD: LIPASE: 24 U/L (ref 11–51)

## 2018-03-09 LAB — PREGNANCY, URINE: Preg Test, Ur: NEGATIVE

## 2018-03-09 MED ORDER — IBUPROFEN 600 MG PO TABS: 600.0000 mg | ORAL_TABLET | Freq: Four times a day (QID) | ORAL | 0 refills | Status: DC | PRN

## 2018-03-09 NOTE — ED Provider Notes (Signed)
[facility]-EMERGENCY DEPT Provider Note   CSN: [id number] Arrival date & time: 03/09/18  7829     History   Chief Complaint Chief Complaint  Patient presents with  . Abdominal Pain    HPI Gloria Cox is a 22 y.o. female.  The history is provided by the patient. No language interpreter was used.  Abdominal Pain       22 year old female with history of prediabetes, dyspepsia, autoimmune thyroiditis presenting for evaluation of abdominal pain.  Patient report for the past 3 to 4 days she has had pain to her lower abdomen.  She described pain as a throbbing achy pain, waxing waning without any significant fever, chills, nausea vomiting diarrhea dysuria vaginal.  Vaginal discharge.  She does notice some vaginal spotting and states that she is late on her menstruation.  She does have a Nexplanon.  She reports several days prior she did have some pain to her right lower quadrant after sexual intercourse.  Pain has since subsided.  Past Medical History:  Diagnosis Date  . Dyspepsia   . Goiter   . Plethora   . Pre-diabetes   . Striae   . Thyroiditis, autoimmune     Patient Active Problem List   Diagnosis Date Noted  . Transaminitis 12/17/2015  . Cholelithiasis 12/17/2015  . RUQ abdominal pain 12/17/2015  . Cholelithiases 12/17/2015  . 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 Migraine without aura and without status migrainosus, not intractable 02/14/2015  . Tension headache 02/14/2015  . Presence of subdermal contraceptive implant 11/28/2014  . Adjustment disorder with mixed anxiety and depressed mood 07/18/2014  . Juvenile mammary hypertrophy 04/24/2014  . Macromastia 02/20/2014  . Hyperlipemia 05/09/2013  . Dysthymia 08/19/2011  . Morbid obesity (HCC) 04/20/2011  . Pre-diabetes 07/07/2010    Past Surgical History:  Procedure Laterality Date  . CHOLECYSTECTOMY N/A 12/18/2015   Procedure: LAPAROSCOPIC CHOLECYSTECTOMY;  Surgeon:  Emelia Loron, MD;  Location: [facility];  Service: General;  Laterality: N/A;  . NO PAST SURGERIES    . WISDOM TOOTH EXTRACTION  2014     OB History   None      Home Medications    Prior to Admission medications   Medication Sig Start Date End Date Taking? Authorizing Provider  acetaminophen (TYLENOL) 500 MG tablet Take 500 mg by mouth daily as needed (pain).    [provider]  etonogestrel (NEXPLANON) 68 MG IMPL implant 1 each by Subdermal route once. Placed 04/2014    [provider]  naproxen (NAPROSYN) 500 MG tablet Take 1 tablet (500 mg total) by mouth 2 (two) times daily. 10/11/17   Petrucelli, Samantha R, PA-C  ondansetron (ZOFRAN ODT) 4 MG disintegrating tablet Take 1 tablet (4 mg total) by mouth every 8 (eight) hours as needed for nausea or vomiting. 10/11/17   Petrucelli, Samantha R, PA-C  oxyCODONE-acetaminophen (PERCOCET/ROXICET) 5-325 MG tablet Take 1 tablet by mouth every 6 (six) hours as needed for severe pain. 10/11/17   Petrucelli, Samantha R, PA-C  budesonide (PULMICORT) 180 MCG/ACT inhaler Inhale 2 puffs into the lungs as needed.  06/14/11  [provider]  loratadine (CLARITIN) 10 MG tablet Take 10 mg by mouth daily.  06/14/11  [provider]    Family History Family History  Problem Relation Age of Onset  . Obesity Mother   . Obesity Sister   . Obesity Brother   . Obesity Maternal Grandmother   . Heart Problems Maternal Grandfather 18  Social History Social History   Tobacco Use  . Smoking status: Former Smoker    Packs/day: 0.10    Types: Cigarettes    Last attempt to quit: 03/09/2018  . Smokeless tobacco: Never Used  Substance Use Topics  . Alcohol use: Yes    Comment: socailly  . Drug use: No     Allergies   Patient has no known allergies.   Review of Systems Review of Systems  Gastrointestinal: Positive for abdominal pain.  All other systems reviewed and are negative.    Physical Exam Updated  Vital Signs BP 131/87 (BP Location: Left Arm)   Pulse (!) 112   Temp 97.8 F (36.6 C) (Oral)   Resp 16   Ht 5\' 3"  (1.6 m)   Wt 95.3 kg   LMP 01/24/2018   SpO2 100%   BMI 37.20 kg/m   Physical Exam  Constitutional: She appears well-developed and well-nourished. No distress.  HENT:  Head: Atraumatic.  Eyes: Conjunctivae are normal.  Neck: Neck supple.  Cardiovascular: Normal rate and regular rhythm.  Pulmonary/Chest: Effort normal and breath sounds normal.  Abdominal: Bowel sounds are normal. She exhibits no distension. There is tenderness (Tenderness to right lower quadrant without guarding or rebound tenderness.).  Genitourinary:  Genitourinary Comments: Chaperone present during exam.  No inguinal adenopathy or inguinal hernia noted.  Normal external genitalia.  No pain with speculum insertion.  Small amount of blood noted in vaginal vault.  Closed cervical os free of lesion or rash.  On bimanual examination, no adnexal tenderness or cervical motion tenderness.  Neurological: She is alert.  Skin: No rash noted.  Psychiatric: She has a normal mood and affect.  Nursing note and vitals reviewed.    ED Treatments / Results  Labs (all labs ordered are listed, but only abnormal results are displayed) Labs Reviewed  COMPREHENSIVE METABOLIC PANEL - Abnormal; Notable for the following components:      Result Value   Glucose, Bld 102 (*)    All other components within normal limits  URINALYSIS, ROUTINE W REFLEX MICROSCOPIC - Abnormal; Notable for the following components:   Hgb urine dipstick LARGE (*)    All other components within normal limits  WET PREP, GENITAL  LIPASE, BLOOD  CBC  PREGNANCY, URINE  RPR  HIV ANTIBODY (ROUTINE TESTING W REFLEX)  GC/CHLAMYDIA PROBE AMP ([place]) NOT AT [facility]    EKG None  Radiology No results found.  Procedures Procedures (including critical care time)  Medications Ordered in ED Medications - No data to display   Initial  Impression / Assessment and Plan / ED Course  I have reviewed the triage vital signs and the nursing notes.  Pertinent labs & imaging results that were available during my care of the patient were reviewed by me and considered in my medical decision making (see chart for details).     BP 117/76   Pulse 65   Temp 97.8 F (36.6 C) (Oral)   Resp 18   Ht 5\' 3"  (1.6 m)   Wt 95.3 kg   LMP 01/24/2018   SpO2 99%   BMI 37.20 kg/m    Final Clinical Impressions(s) / ED Diagnoses   Final diagnoses:  Pelvic pain in female    ED Discharge Orders         Ordered    ibuprofen (ADVIL,MOTRIN) 600 MG tablet  Every 6 hours PRN     03/09/18 1345         1:39 PM Wet  prep without any concerning finding.  Urinalysis with large hemoglobin and urine dipsticks but no evidence of infection.  This is likely a contaminant from her vaginal bleeding.  Her pregnancy test is negative.  Labs otherwise reassuring, normal WBC, normal H&H, normal electrolytes panel.  At this time I have low suspicion for acute medical pathology such as appendicitis, ovarian torsion, tubo-ovarian abscess, PID.  At this time, pt is stable for discharge.  NSAIDs as needed for pain.  Suspect ovarian cyst causing sxs but pt understand to return if condition worsen.    Fayrene Helperran, Karlo Goeden, PA-C 03/09/18 1346    Tilden Fossaees, Elizabeth, MD 03/10/18 [phone]

## 2018-03-09 NOTE — ED Triage Notes (Addendum)
Pt states that she has been having right sided abdominal pain to her back. Pt states that she has had N/V/D. Pt states her last cycle was 10/28. Pt also admits to unprotected sex.

## 2018-03-10 LAB — GC/CHLAMYDIA PROBE AMP (~~LOC~~) NOT AT ARMC
Chlamydia: NEGATIVE
Neisseria Gonorrhea: NEGATIVE

## 2018-03-10 LAB — RPR: RPR Ser Ql: NONREACTIVE

## 2018-03-10 LAB — HIV ANTIBODY (ROUTINE TESTING W REFLEX): HIV Screen 4th Generation wRfx: NONREACTIVE

## 2018-04-19 ENCOUNTER — Encounter (HOSPITAL_COMMUNITY): Payer: Self-pay

## 2018-04-19 ENCOUNTER — Other Ambulatory Visit: Payer: Self-pay

## 2018-04-19 ENCOUNTER — Emergency Department (HOSPITAL_COMMUNITY)
Admission: EM | Admit: 2018-04-19 | Discharge: 2018-04-19 | Disposition: A | Discharge status: Home / Self Care | Payer: Self-pay | Attending: Emergency Medicine | Admitting: Emergency Medicine

## 2018-04-19 DIAGNOSIS — Z87891 Personal history of nicotine dependence: Secondary | ICD-10-CM | POA: Insufficient documentation

## 2018-04-19 DIAGNOSIS — Z79899 Other long term (current) drug therapy: Secondary | ICD-10-CM | POA: Insufficient documentation

## 2018-04-19 DIAGNOSIS — H6691 Otitis media, unspecified, right ear: Secondary | ICD-10-CM | POA: Insufficient documentation

## 2018-04-19 MED ORDER — AMOXICILLIN 500 MG PO CAPS: 500.0000 mg | ORAL_CAPSULE | Freq: Three times a day (TID) | ORAL | 0 refills | Status: DC

## 2018-04-19 MED ORDER — AMOXICILLIN 500 MG PO CAPS
500.0000 mg | ORAL_CAPSULE | Freq: Once | ORAL | Status: AC
  Administered 2018-04-19: 500 mg via ORAL
  Filled 2018-04-19: qty 1

## 2018-04-19 NOTE — ED Provider Notes (Signed)
[facility]-EMERGENCY DEPT Provider Note   CSN: [id number] Arrival date & time: 04/19/18  [id number]     History   Chief Complaint Chief Complaint  Patient presents with  . Otalgia    R    HPI Gloria Cox is a 23 y.o. female.  The history is provided by the patient and medical records.  Otalgia     23 y.o. F with hx of pre-diabetes, thyroiditis, presenting to the ED for right ear pain.  States last week she had a cold with a lot of nasal congestion but that seems better now.  She woke up last evening around 11pm after only 2 hours of sleep with severe right ear pain.  Has been unable to fall back asleep since then.  Reports dull, throbbing pain in her right ear and a "whooshing" sound like the ocean in her ear as well.  States whenever she swallows or yawns her ears will pop.  She denies hearing loss.  No fever/chills.  No sore throat or other URI symptoms currently.  Past Medical History:  Diagnosis Date  . Dyspepsia   . Goiter   . Plethora   . Pre-diabetes   . Striae   . Thyroiditis, autoimmune     Patient Active Problem List   Diagnosis Date Noted  . Transaminitis 12/17/2015  . Cholelithiasis 12/17/2015  . RUQ abdominal pain 12/17/2015  . Cholelithiases 12/17/2015  . 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 Migraine without aura and without status migrainosus, not intractable 02/14/2015  . Tension headache 02/14/2015  . Presence of subdermal contraceptive implant 11/28/2014  . Adjustment disorder with mixed anxiety and depressed mood 07/18/2014  . Juvenile mammary hypertrophy 04/24/2014  . Macromastia 02/20/2014  . Hyperlipemia 05/09/2013  . Dysthymia 08/19/2011  . Morbid obesity (HCC) 04/20/2011  . Pre-diabetes 07/07/2010    Past Surgical History:  Procedure Laterality Date  . CHOLECYSTECTOMY N/A 12/18/2015   Procedure: LAPAROSCOPIC CHOLECYSTECTOMY;  Surgeon: Emelia LoronMatthew Wakefield, MD;  Location: [facility];  Service: General;   Laterality: N/A;  . NO PAST SURGERIES    . WISDOM TOOTH EXTRACTION  2014     OB History   No obstetric history on file.      Home Medications    Prior to Admission medications   Medication Sig Start Date End Date Taking? Authorizing Provider  etonogestrel (NEXPLANON) 68 MG IMPL implant 1 each by Subdermal route once. Placed 04/2014    [provider]  ibuprofen (ADVIL,MOTRIN) 600 MG tablet Take 1 tablet (600 mg total) by mouth every 6 (six) hours as needed. 03/09/18   Fayrene Helperran, [place], PA-C  budesonide (PULMICORT) 180 MCG/ACT inhaler Inhale 2 puffs into the lungs as needed.  06/14/11  [provider]  loratadine (CLARITIN) 10 MG tablet Take 10 mg by mouth daily.  06/14/11  [provider]    Family History Family History  Problem Relation Age of Onset  . Obesity Mother   . Obesity Sister   . Obesity Brother   . Obesity Maternal Grandmother   . Heart Problems Maternal Grandfather [id number]    Social History Social History   Tobacco Use  . Smoking status: Former Smoker    Packs/day: 0.10    Types: Cigarettes    Last attempt to quit: 03/09/2018    Years since quitting: 0.1  . Smokeless tobacco: Never Used  Substance Use Topics  . Alcohol use: Yes    Comment: socailly  . Drug use: No  Allergies   Patient has no known allergies.   Review of Systems Review of Systems  HENT: Positive for ear pain.   All other systems reviewed and are negative.    Physical Exam Updated Vital Signs BP (!) 143/106   Pulse 73   Temp 97.7 F (36.5 C) (Oral)   Resp 15   SpO2 98%   Physical Exam Vitals signs and nursing note reviewed.  Constitutional:      Appearance: She is well-developed.  HENT:     Head: Normocephalic and atraumatic.     Right Ear: Tenderness present. A middle ear effusion is present. Tympanic membrane is erythematous.     Left Ear: Tympanic membrane and ear canal normal.     Ears:     Comments: Right EAC and TM erythematous, there is  an effusion noted; left ear normal Eyes:     Conjunctiva/sclera: Conjunctivae normal.     Pupils: Pupils are equal, round, and reactive to light.  Neck:     Musculoskeletal: Normal range of motion.  Cardiovascular:     Rate and Rhythm: Normal rate and regular rhythm.     Heart sounds: Normal heart sounds.  Pulmonary:     Effort: Pulmonary effort is normal.     Breath sounds: Normal breath sounds.  Abdominal:     General: Bowel sounds are normal.     Palpations: Abdomen is soft.  Musculoskeletal: Normal range of motion.  Skin:    General: Skin is warm and dry.  Neurological:     Mental Status: She is alert and oriented to person, place, and time.      ED Treatments / Results  Labs (all labs ordered are listed, but only abnormal results are displayed) Labs Reviewed - No data to display  EKG None  Radiology No results found.  Procedures Procedures (including critical care time)  Medications Ordered in ED Medications  amoxicillin (AMOXIL) capsule 500 mg (has no administration in time range)     Initial Impression / Assessment and Plan / ED Course  I have reviewed the triage vital signs and the nursing notes.  Pertinent labs & imaging results that were available during my care of the patient were reviewed by me and considered in my medical decision making (see chart for details).  23 year old female here with right ear pain since around 11 PM last evening.  Had cold symptoms last week but that seems to have improved.  She is afebrile and nontoxic.  On exam her right EAC and TM are erythematous with middle ear effusion noted.  Left ear normal.  Will star ton course of amoxicillin for OM.  Can try over the counter decongestant as well to help.  Close follow-up with PCP.  Return here for any new/acute changes.  Final Clinical Impressions(s) / ED Diagnoses   Final diagnoses:  Acute ear infection, right    ED Discharge Orders         Ordered    amoxicillin (AMOXIL)  500 MG capsule  3 times daily     04/19/18 0515           Garlon HatchetSanders, Joshlyn Beadle M, PA-C 04/19/18 0517    Ward, Layla MawKristen N, DO 04/19/18 [id number]

## 2018-04-19 NOTE — ED Triage Notes (Signed)
Pt reports R side otalgia since 11p last night. Pt has taken naproxen and benadryl without relief. States that she heard a "pop" on the way over here.

## 2018-04-19 NOTE — Discharge Instructions (Signed)
Take the prescribed medication as directed.  Can try over the counter decongestant to try and help as well. Follow-up with your primary care doctor. Return to the ED for new or worsening symptoms.

## 2018-11-02 ENCOUNTER — Other Ambulatory Visit: Payer: Self-pay

## 2018-11-02 DIAGNOSIS — Z20822 Contact with and (suspected) exposure to covid-19: Secondary | ICD-10-CM

## 2018-11-04 ENCOUNTER — Telehealth: Payer: Self-pay | Admitting: General Practice

## 2018-11-04 LAB — NOVEL CORONAVIRUS, NAA: SARS-CoV-2, NAA: NOT DETECTED

## 2018-11-04 NOTE — Telephone Encounter (Signed)
Pt aware covid lab test negative, not detected °

## 2019-01-10 ENCOUNTER — Other Ambulatory Visit: Payer: Self-pay

## 2019-01-10 DIAGNOSIS — Z20822 Contact with and (suspected) exposure to covid-19: Secondary | ICD-10-CM

## 2019-01-12 LAB — NOVEL CORONAVIRUS, NAA: SARS-CoV-2, NAA: NOT DETECTED

## 2019-08-10 ENCOUNTER — Encounter (HOSPITAL_COMMUNITY): Payer: Self-pay | Admitting: Emergency Medicine

## 2019-08-10 ENCOUNTER — Other Ambulatory Visit: Payer: Self-pay

## 2019-08-10 ENCOUNTER — Emergency Department (HOSPITAL_COMMUNITY)
Admission: EM | Admit: 2019-08-10 | Discharge: 2019-08-10 | Disposition: A | Discharge status: Home / Self Care | Payer: Self-pay | Attending: Emergency Medicine | Admitting: Emergency Medicine

## 2019-08-10 DIAGNOSIS — R509 Fever, unspecified: Secondary | ICD-10-CM | POA: Insufficient documentation

## 2019-08-10 DIAGNOSIS — H9201 Otalgia, right ear: Secondary | ICD-10-CM | POA: Insufficient documentation

## 2019-08-10 DIAGNOSIS — J029 Acute pharyngitis, unspecified: Secondary | ICD-10-CM | POA: Insufficient documentation

## 2019-08-10 LAB — GROUP A STREP BY PCR: Group A Strep by PCR: NOT DETECTED

## 2019-08-10 MED ORDER — IBUPROFEN 600 MG PO TABS: 600.0000 mg | ORAL_TABLET | Freq: Four times a day (QID) | ORAL | 0 refills | Status: DC | PRN

## 2019-08-10 MED ORDER — CHLORHEXIDINE GLUCONATE 0.12 % MT SOLN: 10.0000 mL | Freq: Four times a day (QID) | OROMUCOSAL | 0 refills | Status: DC

## 2019-08-10 NOTE — ED Triage Notes (Signed)
Pt c/o right ear pain for couple days. Sore throat as well. reports now has white patches in back of throat and painful to talk and swallow.

## 2019-08-10 NOTE — ED Notes (Signed)
Pt c/o of right side otalgia, upon assessment minor erythemas of the right ear canal is noted at base of right ear drum. Pt is also c/o throat soreness and difficulty swallowing. Upon assessment oral mucosa is pink and moist with good dentition the pharynx appears mildly erythematous with bilateral tonsillar swelling and moderate exudate.

## 2019-08-10 NOTE — Discharge Instructions (Addendum)
You have been diagnosed with viral pharyngitis, no strep infection. Use supportive therapies, including using a cool-mist vaporizer/humidifer/steam from hot showers, limit talking, OTC throat lozenges and mouthwashes qd, gargling w/ warm saltwater, advancement of fluids as tolerated, nasal saline sprays, rest, OTC acetaminophen or ibuprofen as needed for pain control, frequent handwashing, and boiling/disposing of contaminated toothbrushes. Return if you have any concerns.

## 2019-08-10 NOTE — ED Provider Notes (Signed)
[facility]-EMERGENCY DEPT Provider Note   CSN: [id number] Arrival date & time: 08/10/19  1421     History Chief Complaint  Patient presents with  . Sore Throat  . Otalgia    Gloria Cox is a 24 y.o. female.  The history is provided by the patient. No language interpreter was used.     24 year old female with history of prediabetes, autoimmune thyroiditis, presenting for evaluation of sore throat.  Patient report for the past 2 days she has had subjective fever, extreme sore throat, difficulty swallowing, right ear pain, and occasional cough with congestion.  Sore throat is moderate to severe, worse with talking and with swallowing.  She is able to swallow her saliva.  She denies any hearing changes, shortness of breath, productive cough, loss of taste or smell, or recent sick exposure.  She has been using over-the-counter medication including allergy medication without relief.  She has not had a Covid test in the past 14 days.  She has not had any Covid vaccine.  Past Medical History:  Diagnosis Date  . Dyspepsia   . Goiter   . Plethora   . Pre-diabetes   . Striae   . Thyroiditis, autoimmune     Patient Active Problem List   Diagnosis Date Noted  . Transaminitis 12/17/2015  . Cholelithiasis 12/17/2015  . RUQ abdominal pain 12/17/2015  . Cholelithiases 12/17/2015  . 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 Migraine without aura and without status migrainosus, not intractable 02/14/2015  . Tension headache 02/14/2015  . Presence of subdermal contraceptive implant 11/28/2014  . Adjustment disorder with mixed anxiety and depressed mood 07/18/2014  . Juvenile mammary hypertrophy 04/24/2014  . Macromastia 02/20/2014  . Hyperlipemia 05/09/2013  . Dysthymia 08/19/2011  . Morbid obesity (HCC) 04/20/2011  . Pre-diabetes 07/07/2010    Past Surgical History:  Procedure Laterality Date  . CHOLECYSTECTOMY N/A 12/18/2015   Procedure:  LAPAROSCOPIC CHOLECYSTECTOMY;  Surgeon: Emelia Loron, MD;  Location: [facility];  Service: General;  Laterality: N/A;  . NO PAST SURGERIES    . WISDOM TOOTH EXTRACTION  2014     OB History   No obstetric history on file.     Family History  Problem Relation Age of Onset  . Obesity Mother   . Obesity Sister   . Obesity Brother   . Obesity Maternal Grandmother   . Heart Problems Maternal Grandfather 64    Social History   Tobacco Use  . Smoking status: Former Smoker    Packs/day: 0.10    Types: Cigarettes    Quit date: 03/09/2018    Years since quitting: 1.4  . Smokeless tobacco: Never Used  Substance Use Topics  . Alcohol use: Yes    Comment: socailly  . Drug use: No    Home Medications Prior to Admission medications   Medication Sig Start Date End Date Taking? Authorizing Provider  naproxen sodium (ALEVE) 220 MG tablet Take 440 mg by mouth 2 (two) times daily as needed (headache/pain).   Yes [provider]  amoxicillin (AMOXIL) 500 MG capsule Take 1 capsule (500 mg total) by mouth 3 (three) times daily. Patient not taking: Reported on 08/10/2019 04/19/18   Garlon Hatchet, PA-C  ibuprofen (ADVIL,MOTRIN) 600 MG tablet Take 1 tablet (600 mg total) by mouth every 6 (six) hours as needed. Patient not taking: Reported on 08/10/2019 03/09/18   Fayrene Helper, PA-C  budesonide (PULMICORT) 180 MCG/ACT inhaler Inhale 2 puffs into the lungs as  needed.  06/14/11  [provider]  loratadine (CLARITIN) 10 MG tablet Take 10 mg by mouth daily.  06/14/11  [provider]    Allergies    Patient has no known allergies.  Review of Systems   Review of Systems  All other systems reviewed and are negative.   Physical Exam Updated Vital Signs BP 126/74   Pulse 100   Temp 98.4 F (36.9 C) (Oral)   Resp 18   LMP 07/18/2019   SpO2 99%   Physical Exam Vitals and nursing note reviewed.  Constitutional:      General: She is not in acute distress.     Appearance: She is well-developed.  HENT:     Head: Atraumatic.     Right Ear: Ear canal normal. Tympanic membrane is erythematous.     Left Ear: Ear canal normal. Tympanic membrane is not erythematous.     Nose: No congestion.     Mouth/Throat:     Mouth: Mucous membranes are moist.     Pharynx: Uvula midline. Oropharyngeal exudate and posterior oropharyngeal erythema present. No uvula swelling.     Tonsils: Tonsillar exudate present. No tonsillar abscesses. 2+ on the right. 2+ on the left.  Eyes:     Conjunctiva/sclera: Conjunctivae normal.  Cardiovascular:     Rate and Rhythm: Normal rate and regular rhythm.     Heart sounds: Normal heart sounds.  Pulmonary:     Effort: Pulmonary effort is normal.     Breath sounds: No wheezing.  Abdominal:     Palpations: Abdomen is soft.     Tenderness: There is no abdominal tenderness.  Musculoskeletal:     Cervical back: Neck supple.  Skin:    General: Skin is warm.     Findings: No rash.  Neurological:     Mental Status: She is alert and oriented to person, place, and time.  Psychiatric:        Mood and Affect: Mood normal.     ED Results / Procedures / Treatments   Labs (all labs ordered are listed, but only abnormal results are displayed) Labs Reviewed  GROUP A STREP BY PCR    EKG None  Radiology No results found.  Procedures Procedures (including critical care time)  Medications Ordered in ED Medications - No data to display  ED Course  I have reviewed the triage vital signs and the nursing notes.  Pertinent labs & imaging results that were available during my care of the patient were reviewed by me and considered in my medical decision making (see chart for details).    MDM Rules/Calculators/A&P                      BP 126/74   Pulse 100   Temp 98.4 F (36.9 C) (Oral)   Resp 18   LMP 07/18/2019   SpO2 99%   Final Clinical Impression(s) / ED Diagnoses Final diagnoses:  Acute viral pharyngitis    Rx  / DC Orders ED Discharge Orders         Ordered    ibuprofen (ADVIL) 600 MG tablet  Every 6 hours PRN     08/10/19 1722    chlorhexidine (PERIDEX) 0.12 % solution  4 times daily     08/10/19 1722         Patient presenting with sore throat consistent with viral pharyngitis.  2 out of 4 Centor criteria.  The patient did not have trismus, hot potato  voice, uvula deviation, unilateral tonsillar swelling, toxic appearance, drooling or pain with movement of the trachea to suggest peritonsillar abscess or epiglottitis.  No evidence of bacterial infections including peritonsillar abscess, retropharyngeal abscess, epiglottitis.  Rapid strep was obtained and was negative decreasing the likelihood of bacterial pharyngitis (A rapid strep test has a 95% sensitivity and a specificity of 98%). Patient advised to continue ibuprofen and Tylenol at home.  Impression: Viral Pharyngitis Sore Throat     Domenic Moras, PA-C 08/10/19 1724    Charlesetta Shanks, MD 08/10/19 2252

## 2020-04-05 ENCOUNTER — Emergency Department (HOSPITAL_COMMUNITY)
Admission: EM | Admit: 2020-04-05 | Discharge: 2020-04-05 | Disposition: A | Discharge status: Home / Self Care | Payer: Self-pay | Attending: Emergency Medicine | Admitting: Emergency Medicine

## 2020-04-05 ENCOUNTER — Encounter (HOSPITAL_COMMUNITY): Payer: Self-pay

## 2020-04-05 ENCOUNTER — Emergency Department (HOSPITAL_COMMUNITY): Payer: Self-pay

## 2020-04-05 ENCOUNTER — Other Ambulatory Visit: Payer: Self-pay

## 2020-04-05 DIAGNOSIS — Z5321 Procedure and treatment not carried out due to patient leaving prior to being seen by health care provider: Secondary | ICD-10-CM | POA: Insufficient documentation

## 2020-04-05 DIAGNOSIS — W540XXA Bitten by dog, initial encounter: Secondary | ICD-10-CM | POA: Insufficient documentation

## 2020-04-05 DIAGNOSIS — S51052A Open bite, left elbow, initial encounter: Secondary | ICD-10-CM | POA: Insufficient documentation

## 2020-04-05 DIAGNOSIS — Z23 Encounter for immunization: Secondary | ICD-10-CM | POA: Insufficient documentation

## 2020-04-05 DIAGNOSIS — Z0489 Encounter for examination and observation for other specified reasons: Secondary | ICD-10-CM | POA: Insufficient documentation

## 2020-04-05 DIAGNOSIS — Z87891 Personal history of nicotine dependence: Secondary | ICD-10-CM | POA: Insufficient documentation

## 2020-04-05 MED ORDER — KETOROLAC TROMETHAMINE 60 MG/2ML IM SOLN
60.0000 mg | Freq: Once | INTRAMUSCULAR | Status: AC
  Administered 2020-04-05: 60 mg via INTRAMUSCULAR
  Filled 2020-04-05: qty 2

## 2020-04-05 MED ORDER — TETANUS-DIPHTH-ACELL PERTUSSIS 5-2.5-18.5 LF-MCG/0.5 IM SUSY
0.5000 mL | PREFILLED_SYRINGE | Freq: Once | INTRAMUSCULAR | Status: AC
  Administered 2020-04-05: 0.5 mL via INTRAMUSCULAR
  Filled 2020-04-05: qty 0.5

## 2020-04-05 MED ORDER — AMOXICILLIN-POT CLAVULANATE 875-125 MG PO TABS: 1.0000 | ORAL_TABLET | Freq: Two times a day (BID) | ORAL | 0 refills | Status: AC

## 2020-04-05 MED ORDER — LIDOCAINE-EPINEPHRINE 2 %-1:100000 IJ SOLN
20.0000 mL | Freq: Once | INTRAMUSCULAR | Status: AC
  Administered 2020-04-05: 20 mL via INTRADERMAL
  Filled 2020-04-05: qty 1

## 2020-04-05 MED ORDER — IBUPROFEN 800 MG PO TABS: 800.0000 mg | ORAL_TABLET | Freq: Three times a day (TID) | ORAL | 0 refills | Status: DC
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
## 2020-04-05 NOTE — ED Notes (Signed)
Unable to locate in lobby. Called x5

## 2020-04-05 NOTE — ED Notes (Signed)
Called pt x2 for vitals, no response. °

## 2020-04-05 NOTE — Discharge Instructions (Addendum)
Keep area clean by washing with soap and water daily. Do not submerge in water or scrub stitches Apply a bandage at least once daily, change more often if it is dirty Watch for signs of infection (redness, drainage, worsening pain inability to move elbow, fevers), return to the ER for the symptoms Take Tylenol or Ibuprofen for pain as needed Have stitches removed in 7-10 days

## 2020-04-13 ENCOUNTER — Encounter (HOSPITAL_COMMUNITY): Payer: Self-pay | Admitting: Emergency Medicine

## 2020-04-13 ENCOUNTER — Other Ambulatory Visit: Payer: Self-pay

## 2020-04-13 ENCOUNTER — Emergency Department (HOSPITAL_COMMUNITY)
Admission: EM | Admit: 2020-04-13 | Discharge: 2020-04-13 | Disposition: A | Discharge status: Home / Self Care | Payer: Self-pay | Attending: Emergency Medicine | Admitting: Emergency Medicine

## 2020-04-13 DIAGNOSIS — S51012D Laceration without foreign body of left elbow, subsequent encounter: Secondary | ICD-10-CM | POA: Insufficient documentation

## 2020-04-13 DIAGNOSIS — Z87891 Personal history of nicotine dependence: Secondary | ICD-10-CM | POA: Insufficient documentation

## 2020-04-13 DIAGNOSIS — W540XXA Bitten by dog, initial encounter: Secondary | ICD-10-CM | POA: Insufficient documentation

## 2020-04-13 DIAGNOSIS — Z4802 Encounter for removal of sutures: Secondary | ICD-10-CM | POA: Insufficient documentation

## 2020-04-13 NOTE — Discharge Instructions (Addendum)
-  Finish your antibiotic   -Watch for signs of infection which include fever, chills, surrounding redness or red streaking around the wound, pus draining from the wound.  If you experience any other symptoms she return to the emergency department for evaluation.

## 2020-04-13 NOTE — ED Provider Notes (Addendum)
[facility]-EMERGENCY DEPT Provider Note   CSN: [id number] Arrival date & time: 04/13/20  1045     History Chief Complaint  Patient presents with  . Suture / Staple Removal    Gloria Cox is a 25 y.o. female with past medical history significant for prediabetes, autoimmune thyroiditis.   HPI Patient presents to emergency room today with chief complaint of suture removal.  Patient had a gaping laceration to left elbow x8 days ago after a dog bite that was closed with sutures. She was discharged home with prescription for Augmentin which she has been compliant with.  She has been washing the wound daily with soap and water.  She denies fever, chills, purulent drainage, decreased sensation or weakness. Denies any associated pain.    Past Medical History:  Diagnosis Date  . Dyspepsia   . Goiter   . Plethora   . Pre-diabetes   . Striae   . Thyroiditis, autoimmune     Patient Active Problem List   Diagnosis Date Noted  . Transaminitis 12/17/2015  . Cholelithiasis 12/17/2015  . RUQ abdominal pain 12/17/2015  . Cholelithiases 12/17/2015  . 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 Migraine without aura and without status migrainosus, not intractable 02/14/2015  . Tension headache 02/14/2015  . Presence of subdermal contraceptive implant 11/28/2014  . Adjustment disorder with mixed anxiety and depressed mood 07/18/2014  . Juvenile mammary hypertrophy 04/24/2014  . Macromastia 02/20/2014  . Hyperlipemia 05/09/2013  . Dysthymia 08/19/2011  . Morbid obesity ([facility]) 04/20/2011  . Pre-diabetes 07/07/2010    Past Surgical History:  Procedure Laterality Date  . CHOLECYSTECTOMY N/A 12/18/2015   Procedure: LAPAROSCOPIC CHOLECYSTECTOMY;  Surgeon: Emelia Loron, MD;  Location: [facility] OR[facility];  Service: General;  Laterality: N/A;  . NO PAST SURGERIES    . WISDOM TOOTH EXTRACTION  2014     OB History   No obstetric history on file.     Family  History  Problem Relation Age of Onset  . Obesity Mother   . Obesity Sister   . Obesity Brother   . Obesity Maternal Grandmother   . Heart Problems Maternal Grandfather 64    Social History   Tobacco Use  . Smoking status: Former Smoker    Packs/day: 0.10    Types: Cigarettes    Quit date: 03/09/2018    Years since quitting: 2.0  . Smokeless tobacco: Never Used  Vaping Use  . Vaping Use: Every day  . Substances: Nicotine  Substance Use Topics  . Alcohol use: Yes    Comment: socailly  . Drug use: No    Home Medications Prior to Admission medications   Medication Sig Start Date End Date Taking? Authorizing Provider  chlorhexidine (PERIDEX) 0.12 % solution Use as directed 10 mLs in the mouth or throat 4 (four) times daily. 08/10/19   Fayrene Helper, PA-C  ibuprofen (ADVIL) 800 MG tablet Take 1 tablet (800 mg total) by mouth 3 (three) times daily. 04/05/20   Mare Ferrari, PA-C  naproxen sodium (ALEVE) 220 MG tablet Take 440 mg by mouth 2 (two) times daily as needed (headache/pain).    [provider]  budesonide (PULMICORT) 180 MCG/ACT inhaler Inhale 2 puffs into the lungs as needed.  06/14/11  [provider]  loratadine (CLARITIN) 10 MG tablet Take 10 mg by mouth daily.  06/14/11  [provider]    Allergies    Patient has no known allergies.  Review of Systems  Review of Systems All other systems are reviewed and are negative for acute change except as noted in the HPI.  Physical Exam Updated Vital Signs BP 130/84 (BP Location: Left Arm)   Pulse 90   Temp 97.9 F (36.6 C) (Oral)   Resp 16   LMP 04/01/2020   SpO2 97%   Physical Exam Vitals and nursing note reviewed.  Constitutional:      Appearance: She is well-developed. She is not ill-appearing or toxic-appearing.  HENT:     Head: Normocephalic and atraumatic.     Nose: Nose normal.  Eyes:     General: No scleral icterus.       Right eye: No discharge.        Left eye: No  discharge.     Conjunctiva/sclera: Conjunctivae normal.  Neck:     Vascular: No JVD.  Cardiovascular:     Rate and Rhythm: Normal rate and regular rhythm.     Pulses: Normal pulses.          Dorsalis pedis pulses are 2+ on the right side and 2+ on the left side.     Heart sounds: Normal heart sounds.  Pulmonary:     Effort: Pulmonary effort is normal.     Breath sounds: Normal breath sounds.  Abdominal:     General: There is no distension.  Musculoskeletal:        General: Normal range of motion.     Cervical back: Normal range of motion.     Comments: Full range of motion of left upper extremity.  Compartments are soft.  Left upper extremity is neurovascularly intact.  Skin:    General: Skin is warm and dry.     Comments: 4 cm L shaped healing incision on left elbow between lateral epicondyle and olecranon process.  No signs of infection.  Neurological:     Mental Status: She is oriented to person, place, and time.     GCS: GCS eye subscore is 4. GCS verbal subscore is 5. GCS motor subscore is 6.     Comments: Sensation intact to left upper extremity.  Strong and equal grip strength in bilateral upper extremities.  Psychiatric:        Behavior: Behavior normal.     ED Results / Procedures / Treatments   Labs (all labs ordered are listed, but only abnormal results are displayed) Labs Reviewed - No data to display  EKG None  Radiology No results found.  Procedures .Suture Removal  Date/Time: 04/13/2020 12:31 PM Performed by: Shanon Ace, PA-C Authorized by: Shanon Ace, PA-C   Consent:    Consent obtained:  Verbal   Consent given by:  Patient   Risks, benefits, and alternatives were discussed: yes     Risks discussed:  Bleeding, pain and wound separation   Alternatives discussed:  No treatment Universal protocol:    Patient identity confirmed:  Verbally with patient and arm band Location:    Location:  Upper extremity   Upper extremity  location:  Elbow   Elbow location:  L elbow Procedure details:    Wound appearance:  No signs of infection   Number of sutures removed:  5 Post-procedure details:    Post-removal:  Dressing applied   Procedure completion:  Tolerated well, no immediate complications   (including critical care time)  Medications Ordered in ED Medications - No data to display  ED Course  I have reviewed the triage vital signs and the nursing notes.  Pertinent labs & imaging results that were available during my care of the patient were reviewed by me and considered in my medical decision making (see chart for details).    MDM Rules/Calculators/A&P                          History provided by patient with additional history obtained from chart review.    Pt to ER for suture removal and wound check as above. Procedure tolerated well. Vitals normal, no signs of infection. Scar minimization & return precautions given at dc.    Portions of this note were generated with Scientist, clinical (histocompatibility and immunogenetics). Dictation errors may occur despite best attempts at proofreading.   Final Clinical Impression(s) / ED Diagnoses Final diagnoses:  Visit for suture removal    Rx / DC Orders ED Discharge Orders    None       Shanon Ace, PA-C 04/13/20 1215    Shanon Ace, PA-C 04/13/20 1232    Milagros Loll, MD 04/14/20 [phone]

## 2020-04-13 NOTE — ED Triage Notes (Signed)
Patient reports sutures placed to left elbow on 1/7. Here for suture removal.

## 2021-01-08 ENCOUNTER — Encounter (HOSPITAL_COMMUNITY): Payer: Self-pay

## 2021-01-08 ENCOUNTER — Ambulatory Visit (HOSPITAL_COMMUNITY)
Admission: EM | Admit: 2021-01-08 | Discharge: 2021-01-08 | Disposition: A | Discharge status: Home / Self Care | Payer: Medicaid Other | Attending: Internal Medicine | Admitting: Internal Medicine

## 2021-01-08 ENCOUNTER — Other Ambulatory Visit: Payer: Self-pay

## 2021-01-08 DIAGNOSIS — N898 Other specified noninflammatory disorders of vagina: Secondary | ICD-10-CM | POA: Insufficient documentation

## 2021-01-08 DIAGNOSIS — Z3201 Encounter for pregnancy test, result positive: Secondary | ICD-10-CM

## 2021-01-08 DIAGNOSIS — Z3490 Encounter for supervision of normal pregnancy, unspecified, unspecified trimester: Secondary | ICD-10-CM | POA: Insufficient documentation

## 2021-01-08 LAB — POCT URINALYSIS DIPSTICK, ED / UC
Bilirubin Urine: NEGATIVE
Glucose, UA: NEGATIVE mg/dL
Hgb urine dipstick: NEGATIVE
Ketones, ur: NEGATIVE mg/dL
Leukocytes,Ua: NEGATIVE
Nitrite: NEGATIVE
Protein, ur: NEGATIVE mg/dL
Specific Gravity, Urine: 1.015 (ref 1.005–1.030)
Urobilinogen, UA: 0.2 mg/dL (ref 0.0–1.0)
pH: 7 (ref 5.0–8.0)

## 2021-01-08 LAB — POC URINE PREG, ED: Preg Test, Ur: POSITIVE — AB

## 2021-01-08 NOTE — ED Triage Notes (Signed)
Pt presents for pregnancy test. States she had 3 positive pregnancy test at home.

## 2021-01-08 NOTE — ED Provider Notes (Signed)
[facility]    CSN: [id number] Arrival date & time: 01/08/21  1158      History   Chief Complaint Chief Complaint  Patient presents with   Possible Pregnancy    HPI Gloria Cox is a 25 y.o. female.   Amenorrhea No LMP recorded (within months). Thinks her last period was in July, but not exact Reports miscarriage at beginning of 2022 and nexplanon removal at end of 2021 Had three positive tests at home in the last 5 days No pelvic pain or vaginal bleeding Has had some burning in the vaginal area and with urination No back pain Has an OB, hasn't started any prenatals   Past Medical History:  Diagnosis Date   Dyspepsia    Goiter    [place]    Pre-diabetes    Pre-diabetes    Striae    Thyroiditis, autoimmune     Patient Active Problem List   Diagnosis Date Noted   Transaminitis 12/17/2015   Cholelithiasis 12/17/2015   RUQ abdominal pain 12/17/2015   Cholelithiases 12/17/2015   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   Migraine without aura and without status migrainosus, not intractable 02/14/2015   Tension headache 02/14/2015   Presence of subdermal contraceptive implant 11/28/2014   Adjustment disorder with mixed anxiety and depressed mood 07/18/2014   Juvenile mammary hypertrophy 04/24/2014   Macromastia 02/20/2014   Hyperlipemia 05/09/2013   Dysthymia 08/19/2011   Morbid obesity (HCC) 04/20/2011   Pre-diabetes 07/07/2010    Past Surgical History:  Procedure Laterality Date   CHOLECYSTECTOMY N/A 12/18/2015   Procedure: LAPAROSCOPIC CHOLECYSTECTOMY;  Surgeon: Emelia Loron, MD;  Location: [facility];  Service: General;  Laterality: N/A;   NO PAST SURGERIES     WISDOM TOOTH EXTRACTION  2014    OB History   No obstetric history on file.      Home Medications    Prior to Admission medications   Medication Sig Start Date End Date Taking? Authorizing Provider  chlorhexidine (PERIDEX) 0.12 % solution Use as directed 10  mLs in the mouth or throat 4 (four) times daily. 08/10/19   Fayrene Helper, PA-C  ibuprofen (ADVIL) 800 MG tablet Take 1 tablet (800 mg total) by mouth 3 (three) times daily. 04/05/20   Mare Ferrari, PA-C  naproxen sodium (ALEVE) 220 MG tablet Take 440 mg by mouth 2 (two) times daily as needed (headache/pain).    [provider]  budesonide (PULMICORT) 180 MCG/ACT inhaler Inhale 2 puffs into the lungs as needed.  06/14/11  [provider]  loratadine (CLARITIN) 10 MG tablet Take 10 mg by mouth daily.  06/14/11  [provider]    Family History Family History  Problem Relation Age of Onset   Obesity Mother    Obesity Sister    Obesity Brother    Obesity Maternal Grandmother    Heart Problems Maternal Grandfather 62    Social History Social History   Tobacco Use   Smoking status: Former    Packs/day: 0.10    Types: Cigarettes    Quit date: 03/09/2018    Years since quitting: 2.8   Smokeless tobacco: Never  Vaping Use   Vaping Use: Every day   Substances: Nicotine  Substance Use Topics   Alcohol use: Not Currently    Comment: socailly   Drug use: No     Allergies   Patient has no known allergies.   Review of Systems Review of Systems  All other systems reviewed  and are negative. Per HPI  Physical Exam Triage Vital Signs ED Triage Vitals  Enc Vitals Group     BP 01/08/21 1322 (!) 145/78     Pulse Rate 01/08/21 1322 (!) 53     Resp 01/08/21 1322 16     Temp 01/08/21 1322 98 F (36.7 C)     Temp Source 01/08/21 1322 Oral     SpO2 01/08/21 1322 98 %     Weight --      Height --      Head Circumference --      Peak Flow --      Pain Score 01/08/21 1325 0     Pain Loc --      Pain Edu? --      Excl. in GC? --    No data found.  Updated Vital Signs BP (!) 145/78 (BP Location: Right Arm)   Pulse (!) 53   Temp 98 F (36.7 C) (Oral)   Resp 16   LMP  (Within Months) Comment: 1-2 months  SpO2 98%   Visual Acuity Right Eye  Distance:   Left Eye Distance:   Bilateral Distance:    Right Eye Near:   Left Eye Near:    Bilateral Near:     Physical Exam Constitutional:      General: She is not in acute distress.    Appearance: Normal appearance. She is not ill-appearing or toxic-appearing.  Cardiovascular:     Rate and Rhythm: Normal rate.  Pulmonary:     Effort: Pulmonary effort is normal. No respiratory distress.  Abdominal:     Palpations: Abdomen is soft.     Tenderness: There is no abdominal tenderness. There is no right CVA tenderness, left CVA tenderness or guarding.  Skin:    General: Skin is warm and dry.  Neurological:     Mental Status: She is alert and oriented to person, place, and time.     UC Treatments / Results  Labs (all labs ordered are listed, but only abnormal results are displayed) Labs Reviewed  POC URINE PREG, ED - Abnormal; Notable for the following components:      Result Value   Preg Test, Ur POSITIVE (*)    All other components within normal limits  URINE CULTURE  POCT URINALYSIS DIPSTICK, ED / UC  CERVICOVAGINAL ANCILLARY ONLY    EKG   Radiology No results found.  Procedures Procedures (including critical care time)  Medications Ordered in UC Medications - No data to display  Initial Impression / Assessment and Plan / UC Course  I have reviewed the triage vital signs and the nursing notes.  Pertinent labs & imaging results that were available during my care of the patient were reviewed by me and considered in my medical decision making (see chart for details).     Patient is a 25 year old who presents with amenorrhea and 3 positive pregnancy test at home, pregnancy test positive today.  Unknown dating, recommend follow-up with OB.  UA negative.  Urine culture and vaginal swab also obtained given her complaints of vaginal discomfort.  Advised to obtain a prenatal and follow-up with an OB provider.  Advised that until she has a confirmed intrauterine  pregnancy that if she has pelvic pain or vaginal bleeding she should present to the emergency room right away.  Advised to avoid NSAIDs.   Final Clinical Impressions(s) / UC Diagnoses   Final diagnoses:  Pregnancy, unspecified gestational age  Vaginal irritation  Discharge Instructions      Your pregnancy test was positive, congratulations.  You should follow-up with your OB for further care.  Make sure you obtain a prenatal vitamin.  Do not take ibuprofen, only Tylenol as needed for pain.  Go to the emergency room right away if you have pelvic pain and vaginal bleeding.  We will contact you tomorrow about your vaginal swab if it shows something that needs to be treated.     ED Prescriptions   None    PDMP not reviewed this encounter.   Unknown Jim, DO 01/08/21 1347

## 2021-01-08 NOTE — Discharge Instructions (Addendum)
Your pregnancy test was positive, congratulations.  You should follow-up with your OB for further care.  Make sure you obtain a prenatal vitamin.  Do not take ibuprofen, only Tylenol as needed for pain.  Go to the emergency room right away if you have pelvic pain and vaginal bleeding.  We will contact you tomorrow about your vaginal swab if it shows something that needs to be treated.

## 2021-01-09 ENCOUNTER — Telehealth (HOSPITAL_COMMUNITY): Payer: Self-pay | Admitting: Emergency Medicine

## 2021-01-09 LAB — CERVICOVAGINAL ANCILLARY ONLY
Bacterial Vaginitis (gardnerella): POSITIVE — AB
Candida Glabrata: NEGATIVE
Candida Vaginitis: POSITIVE — AB
Chlamydia: NEGATIVE
Comment: NEGATIVE
Comment: NEGATIVE
Comment: NEGATIVE
Comment: NEGATIVE
Comment: NEGATIVE
Comment: NORMAL
Neisseria Gonorrhea: NEGATIVE
Trichomonas: NEGATIVE

## 2021-01-09 MED ORDER — CLOTRIMAZOLE 1 % VA CREA: 1.0000 | TOPICAL_CREAM | Freq: Every day | VAGINAL | 0 refills | Status: DC

## 2021-01-09 MED ORDER — METRONIDAZOLE 0.75 % VA GEL: 1.0000 | Freq: Every day | VAGINAL | 0 refills | Status: AC

## 2021-01-10 LAB — URINE CULTURE: Culture: >=100000 — AB

## 2021-01-12 ENCOUNTER — Emergency Department (HOSPITAL_COMMUNITY)
Admission: EM | Admit: 2021-01-12 | Discharge: 2021-01-13 | Disposition: A | Discharge status: Home / Self Care | Payer: Medicaid Other | Attending: Emergency Medicine | Admitting: Emergency Medicine

## 2021-01-12 ENCOUNTER — Other Ambulatory Visit: Payer: Self-pay

## 2021-01-12 ENCOUNTER — Encounter (HOSPITAL_COMMUNITY): Payer: Self-pay

## 2021-01-12 DIAGNOSIS — R21 Rash and other nonspecific skin eruption: Secondary | ICD-10-CM | POA: Diagnosis present

## 2021-01-12 DIAGNOSIS — Z87891 Personal history of nicotine dependence: Secondary | ICD-10-CM | POA: Insufficient documentation

## 2021-01-12 DIAGNOSIS — L509 Urticaria, unspecified: Secondary | ICD-10-CM | POA: Diagnosis not present

## 2021-01-12 NOTE — ED Triage Notes (Signed)
Pt states she is pregnant, unsure how far along. Pt states she ate [employer] 2 days ago and then started having hives on right leg. Pt now has hives on bilateral arms, chest, legs. Pt states it is very irritating and hot. Benadryl gave relief. Pt c/o "very minor" abd pain.

## 2021-01-12 NOTE — ED Provider Notes (Signed)
Emergency Medicine Provider Triage Evaluation Note  Gloria Cox , a 25 y.o. female  was evaluated in triage.  Pt complains of urticaria to extremities.  This has been going on for multiple days.  It started after patient ate some [employer].  Has eaten [employer] in the past.  No allergies noted.  Patient found out she was pregnant recently however does not know how far along.  Review of Systems  Positive: Itching Negative: Chest pain or shortness of breath  Physical Exam  BP 122/73 (BP Location: Left Arm)   Pulse 69   Temp 98.5 F (36.9 C) (Oral)   Resp 18   Ht 5\' 2"  (1.575 m)   Wt 76.2 kg   SpO2 97%   BMI 30.73 kg/m  Gen:   Awake, no distress   Resp:  Normal effort  MSK:   Moves extremities without difficulty  Other:  Hives noted all throughout patient's upper extremities  Medical Decision Making  Medically screening exam initiated at 6:38 PM.  Appropriate orders placed.  Gloria Cox was informed that the remainder of the evaluation will be completed by another provider, this initial triage assessment does not replace that evaluation, and the importance of remaining in the ED until their evaluation is complete.     [name] 01/12/21 1840    01/14/21, MD 01/12/21 2229

## 2021-01-13 MED ORDER — DIPHENHYDRAMINE HCL 25 MG PO CAPS
50.0000 mg | ORAL_CAPSULE | Freq: Once | ORAL | Status: AC
  Administered 2021-01-13: 50 mg via ORAL
  Filled 2021-01-13: qty 2

## 2021-01-13 MED ORDER — DIPHENHYDRAMINE HCL 25 MG PO TABS: 25.0000 mg | ORAL_TABLET | Freq: Four times a day (QID) | ORAL | 0 refills | Status: DC

## 2021-01-13 NOTE — ED Notes (Signed)
Unable to obtain signature at time of discharge due to no signature pad available in hall bed.

## 2021-01-13 NOTE — ED Provider Notes (Signed)
[facility]-EMERGENCY DEPT Provider Note   CSN: [id number] Arrival date & time: 01/12/21  1751     History Chief Complaint  Patient presents with   Urticaria    Gloria Cox is a 25 y.o. female.  25 year old female who presents emerged from today with a diffuse pruritic rash.  Patient states that started little bit after eating [employer] days ago.  He states he gets better with Benadryl but then it comes back.  No loss of consciousness, diarrhea, dyspnea, lightheadedness or other associated symptoms.  States she does have emesis but this is been ongoing with her pregnancy and has not changed with this new issue.  Has not changed any detergents recently.  No new sheets or any other obvious inciting factors.   Urticaria      Past Medical History:  Diagnosis Date   Dyspepsia    Goiter    [place]    Pre-diabetes    Pre-diabetes    Striae    Thyroiditis, autoimmune     Patient Active Problem List   Diagnosis Date Noted   Transaminitis 12/17/2015   Cholelithiasis 12/17/2015   RUQ abdominal pain 12/17/2015   Cholelithiases 12/17/2015   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   Migraine without aura and without status migrainosus, not intractable 02/14/2015   Tension headache 02/14/2015   Presence of subdermal contraceptive implant 11/28/2014   Adjustment disorder with mixed anxiety and depressed mood 07/18/2014   Juvenile mammary hypertrophy 04/24/2014   Macromastia 02/20/2014   Hyperlipemia 05/09/2013   Dysthymia 08/19/2011   Morbid obesity (HCC) 04/20/2011   Pre-diabetes 07/07/2010    Past Surgical History:  Procedure Laterality Date   CHOLECYSTECTOMY N/A 12/18/2015   Procedure: LAPAROSCOPIC CHOLECYSTECTOMY;  Surgeon: Emelia Loron, MD;  Location: [facility];  Service: General;  Laterality: N/A;   NO PAST SURGERIES     WISDOM TOOTH EXTRACTION  2014     OB History     Gravida  1   Para      Term      Preterm      AB       Living         SAB      IAB      Ectopic      Multiple      Live Births              Family History  Problem Relation Age of Onset   Obesity Mother    Obesity Sister    Obesity Brother    Obesity Maternal Grandmother    Heart Problems Maternal Grandfather 29    Social History   Tobacco Use   Smoking status: Former    Packs/day: 0.10    Types: Cigarettes    Quit date: 03/09/2018    Years since quitting: 2.8   Smokeless tobacco: Never  Vaping Use   Vaping Use: Every day   Substances: Nicotine  Substance Use Topics   Alcohol use: Not Currently    Comment: socailly   Drug use: No    Home Medications Prior to Admission medications   Medication Sig Start Date End Date Taking? Authorizing Provider  diphenhydrAMINE (BENADRYL) 25 MG tablet Take 1 tablet (25 mg total) by mouth every 6 (six) hours for 5 days. 01/13/21 01/18/21 Yes Conna Terada, Barbara Cower, MD  chlorhexidine (PERIDEX) 0.12 % solution Use as directed 10 mLs in the mouth or throat 4 (four) times daily. 08/10/19   Fayrene Helper, PA-C  clotrimazole (GYNE-LOTRIMIN) 1 % vaginal cream Place 1 Applicatorful vaginally at bedtime. 01/09/21   Merrilee Jansky, MD  ibuprofen (ADVIL) 800 MG tablet Take 1 tablet (800 mg total) by mouth 3 (three) times daily. 04/05/20   Mare Ferrari, PA-C  metroNIDAZOLE (METROGEL VAGINAL) 0.75 % vaginal gel Place 1 Applicatorful vaginally at bedtime for 5 days. 01/09/21 01/14/21  Merrilee Jansky, MD  naproxen sodium (ALEVE) 220 MG tablet Take 440 mg by mouth 2 (two) times daily as needed (headache/pain).    [provider]  budesonide (PULMICORT) 180 MCG/ACT inhaler Inhale 2 puffs into the lungs as needed.  06/14/11  [provider]  loratadine (CLARITIN) 10 MG tablet Take 10 mg by mouth daily.  06/14/11  [provider]    Allergies    Patient has no known allergies.  Review of Systems   Review of Systems  All other systems reviewed and are  negative.  Physical Exam Updated Vital Signs BP 121/64   Pulse 64   Temp 98 F (36.7 C) (Oral)   Resp 16   Ht 5\' 2"  (1.575 m)   Wt 76.2 kg   LMP 10/12/2020   SpO2 99%   BMI 30.73 kg/m   Physical Exam Vitals and nursing note reviewed.  Constitutional:      Appearance: She is well-developed.  HENT:     Head: Normocephalic and atraumatic.     Nose: Nose normal. No congestion or rhinorrhea.  Eyes:     Pupils: Pupils are equal, round, and reactive to light.  Cardiovascular:     Rate and Rhythm: Normal rate and regular rhythm.  Pulmonary:     Effort: No respiratory distress.     Breath sounds: No stridor.  Abdominal:     General: There is no distension.  Musculoskeletal:     Cervical back: Normal range of motion.  Skin:    General: Skin is warm and dry.     Findings: Rash (urticaria to bilateral upper and lower extremities) present.  Neurological:     General: No focal deficit present.     Mental Status: She is alert.    ED Results / Procedures / Treatments   Labs (all labs ordered are listed, but only abnormal results are displayed) Labs Reviewed - No data to display  EKG None  Radiology No results found.  Procedures Procedures   Medications Ordered in ED Medications  diphenhydrAMINE (BENADRYL) capsule 50 mg (has no administration in time range)    ED Course  I have reviewed the triage vital signs and the nursing notes.  Pertinent labs & imaging results that were available during my care of the patient were reviewed by me and considered in my medical decision making (see chart for details).    MDM Rules/Calculators/A&P                         Rash c/w likely urticaria. Will treat for same. Hold on prednisone with pregnancy. Has Ob appointment coming soon.   Final Clinical Impression(s) / ED Diagnoses Final diagnoses:  Urticaria    Rx / DC Orders ED Discharge Orders          Ordered    diphenhydrAMINE (BENADRYL) 25 MG tablet  Every 6 hours         01/13/21 0010             Mandel Seiden, 01/15/21, MD 01/13/21 01/15/21

## 2021-02-12 ENCOUNTER — Other Ambulatory Visit (HOSPITAL_COMMUNITY)
Admission: RE | Admit: 2021-02-12 | Discharge: 2021-02-12 | Disposition: A | Discharge status: Home / Self Care | Payer: Medicaid Other | Source: Ambulatory Visit | Attending: Women's Health | Admitting: Women's Health

## 2021-02-12 ENCOUNTER — Ambulatory Visit (INDEPENDENT_AMBULATORY_CARE_PROVIDER_SITE_OTHER): Payer: Medicaid Other | Admitting: Women's Health

## 2021-02-12 ENCOUNTER — Other Ambulatory Visit: Payer: Self-pay

## 2021-02-12 ENCOUNTER — Encounter: Payer: Self-pay | Admitting: Women's Health

## 2021-02-12 DIAGNOSIS — O99331 Smoking (tobacco) complicating pregnancy, first trimester: Secondary | ICD-10-CM

## 2021-02-12 DIAGNOSIS — Z3A17 17 weeks gestation of pregnancy: Secondary | ICD-10-CM | POA: Diagnosis not present

## 2021-02-12 DIAGNOSIS — Z3401 Encounter for supervision of normal first pregnancy, first trimester: Secondary | ICD-10-CM | POA: Insufficient documentation

## 2021-02-12 DIAGNOSIS — Z3403 Encounter for supervision of normal first pregnancy, third trimester: Secondary | ICD-10-CM | POA: Insufficient documentation

## 2021-02-12 DIAGNOSIS — Z8709 Personal history of other diseases of the respiratory system: Secondary | ICD-10-CM

## 2021-02-12 DIAGNOSIS — O99311 Alcohol use complicating pregnancy, first trimester: Secondary | ICD-10-CM

## 2021-02-12 DIAGNOSIS — R7303 Prediabetes: Secondary | ICD-10-CM

## 2021-02-12 DIAGNOSIS — O99210 Obesity complicating pregnancy, unspecified trimester: Secondary | ICD-10-CM

## 2021-02-12 DIAGNOSIS — J45909 Unspecified asthma, uncomplicated: Secondary | ICD-10-CM

## 2021-02-12 MED ORDER — ALBUTEROL SULFATE HFA 108 (90 BASE) MCG/ACT IN AERS: 2.0000 | INHALATION_SPRAY | Freq: Four times a day (QID) | RESPIRATORY_TRACT | 2 refills | Status: DC | PRN

## 2021-02-12 NOTE — Progress Notes (Signed)
History:   Gloria Cox is a 25 y.o. G1P0 at 27w4dby LMP being seen today for her first obstetrical visit.  Her obstetrical history is significant for obesity and asthma, alcohol and tobacco use in pregnancy until finding out she was pregnant . Patient is considering breast feeding. Pregnancy history fully reviewed.  Allergies: NKDA Current Medications: PNV PMH: hx pre-diabetes, migraines, asthma as child, no exacerbations since childhood, no history of hospitalizations. No HTN, DM. PSH: gallbladder removed 2019, wisdom teeth removed 2015 OB Hx: first pregnancy Social Hx: pt does not smoke, drink, or use drugs. Pt reports drinking and vaping until finding out she was pregnant at unknown number of weeks. Family Hx: none Pt declines flu vaccine.  Patient reports no complaints.      HISTORY: OB History  Gravida Para Term Preterm AB Living  1 0 0 0 0 0  SAB IAB Ectopic Multiple Live Births  0 0 0 0 0    # Outcome Date GA Lbr Len/2nd Weight Sex Delivery Anes PTL Lv  1 Current             Last pap smear was done age [age]  Past Medical History:  Diagnosis Date   Dyspepsia    Goiter    P[place]th[place]    Pre-diabetes    Pre-diabetes    Striae    Thyroiditis, autoimmune    Past Surgical History:  Procedure Laterality Date   CHOLECYSTECTOMY N/A 12/18/2015   Procedure: LAPAROSCOPIC CHOLECYSTECTOMY;  Surgeon: MRolm Bookbinder MD;  Location: [facility];  Service: General;  Laterality: N/A;   NO PAST SURGERIES     WISDOM TOOTH EXTRACTION  2014   Family History  Problem Relation Age of Onset   Obesity Mother    Obesity Sister    Obesity Brother    Obesity Maternal Grandmother    Heart Problems Maternal Grandfather 69   Social History   Tobacco Use   Smoking status: Former    Packs/day: 0.10    Types: Cigarettes    Quit date: 03/09/2018    Years since quitting: 2.9   Smokeless tobacco: Never  Vaping Use   Vaping Use: Every day   Substances: Nicotine  Substance Use  Topics   Alcohol use: Not Currently    Comment: socailly   Drug use: No   No Known Allergies Current Outpatient Medications on File Prior to Visit  Medication Sig Dispense Refill   chlorhexidine (PERIDEX) 0.12 % solution Use as directed 10 mLs in the mouth or throat 4 (four) times daily. (Patient not taking: Reported on 02/12/2021) 120 mL 0   clotrimazole (GYNE-LOTRIMIN) 1 % vaginal cream Place 1 Applicatorful vaginally at bedtime. (Patient not taking: Reported on 02/12/2021) 45 g 0   diphenhydrAMINE (BENADRYL) 25 MG tablet Take 1 tablet (25 mg total) by mouth every 6 (six) hours for 5 days. 20 tablet 0   ibuprofen (ADVIL) 800 MG tablet Take 1 tablet (800 mg total) by mouth 3 (three) times daily. (Patient not taking: Reported on 02/12/2021) 21 tablet 0   naproxen sodium (ALEVE) 220 MG tablet Take 440 mg by mouth 2 (two) times daily as needed (headache/pain). (Patient not taking: Reported on 02/12/2021)     [DISCONTINUED] budesonide (PULMICORT) 180 MCG/ACT inhaler Inhale 2 puffs into the lungs as needed.     [DISCONTINUED] loratadine (CLARITIN) 10 MG tablet Take 10 mg by mouth daily.     No current facility-administered medications on file prior to visit.    Review  of Systems Pertinent items noted in HPI and remainder of comprehensive ROS otherwise negative.  Physical Exam:   Vitals:   02/12/21 1537  BP: 126/78  Pulse: 81  Weight: 182 lb (82.6 kg)   Fetal Heart Rate (bpm): 156  Uterine size:   not c/w dates, bedside US shows approximately 10w fetus, will schedule dating [place].  General: well-developed, well-nourished female in no acute distress  Breasts:  normal appearance, no masses or tenderness bilaterally, exam done in the presence of a chaperone.   Skin: normal coloration and turgor, no rashes  Neurologic: oriented, normal, negative, normal mood  Extremities: normal strength, tone, and muscle mass, ROM of all joints is normal  HEENT PERRLA, extraocular movement intact and sclera  clear, anicteric  Neck supple and no masses  Cardiovascular: regular rate and rhythm  Respiratory:  no respiratory distress, normal breath sounds  Abdomen: soft, non-tender; bowel sounds normal; no masses,  no organomegaly  Pelvic: normal external genitalia, no lesions, normal vaginal mucosa, normal vaginal discharge, normal cervix, pap smear done. Exam done in the presence of a chaperone.     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 Encounter for supervision of normal first pregnancy in first trimester - Cervicovaginal ancillary only( [facility]) - Culture, OB Urine - Cytology - PAP( [facility]) - [place] MFM OB DETAIL +14 WK; Future  2. Morbid obesity (HCC) - HgB A1c - Comp Met ([facility]) - Protein / creatinine ratio, urine  3. Pre-diabetes - HgB A1c  4. [redacted] weeks gestation of pregnancy -likely 10 weeks, 17 weeks by unsure LMP  5. Uncomplicated asthma, unspecified asthma severity, unspecified whether persistent - albuterol (VENTOLIN HFA) 108 (90 Base) MCG/ACT inhaler; Inhale 2 puffs into the lungs every 6 (six) hours as needed for wheezing or shortness of breath.  Dispense: 8 g; Refill: 2  6. Obesity in pregnancy -start ASA after confirming dates  7. History of asthma -no exacerbations since childhood -RX for  albuterol sent in event of exacerbation  8. Alcohol use affecting pregnancy in first trimester -stopped when she found out she was pregnant  68. Tobacco use affecting pregnancy in first trimester, antepartum -stopped when she found out she was pregnant  Initial labs drawn. Continue prenatal vitamins. Problem list reviewed and updated. Genetic Screening discussed, NIPS: ordered. Ultrasound discussed; fetal anatomic survey: ordered. Anticipatory guidance about prenatal visits given including labs, ultrasounds, and testing. Discussed usage of Babyscripts and virtual visits as additional source of managing and completing prenatal visits in midst of coronavirus and pandemic.   Encouraged to complete MyChart Registration for her ability to review results, send requests, and have questions addressed.  The nature of [facility] with multiple MDs and Advanced Practice Providers was explained to patient; also emphasized that residents, students are part of our team. Discussed  with patient that this is a teaching facility and that during their pregnancy there may be female physicians and other healthcare providers involved in their care. This includes, but is not limited to, prenatal visits and ultrasound examinations, as well as, the labor and delivery process and postpartum care. Also discussed with patient that they do have the right to transfer their care to another practice in the event that they do not agree or wish to see female providers under any situation. Informed patient that we will make every attempt to have a female provider care for them, though this cannot be guaranteed, such as in an emergent situation. Also, reminded patient that when they are scheduling their appointments, to request a female provider each time and we will try to accommodate their request. Routine obstetric precautions reviewed. Encouraged to seek out care at office or emergency  room [facility] preferred) for urgent and/or emergent concerns. Return in about 4 weeks (around 03/12/2021) for in-preson LOB/APP OK, needs dating scan with [facility] ASAP.     Clarisa Fling, NP  4:08 PM 02/16/2021

## 2021-02-12 NOTE — Patient Instructions (Signed)
Maternity Assessment Unit (MAU)  The Maternity Assessment Unit (MAU) is located at the [facility] at [facility]. The address is: [address] [place], [place], [place] [address]. Please see map below for additional directions.    The Maternity Assessment Unit is designed to help you during your pregnancy, and for up to 6 weeks after delivery, with any pregnancy- or postpartum-related emergencies, if you think you are in labor, or if your water has broken. For example, if you experience nausea and vomiting, vaginal bleeding, severe abdominal or pelvic pain, elevated blood pressure or other problems related to your pregnancy or postpartum time, please come to the Maternity Assessment Unit for assistance.                        Safe Medications in Pregnancy    Acne: Benzoyl Peroxide Salicylic Acid  Backache/Headache: Tylenol: 2 regular strength every 4 hours OR              2 Extra strength every 6 hours  Colds/Coughs/Allergies: Benadryl (alcohol free) 25 mg every 6 hours as needed Breath right strips Claritin Cepacol throat lozenges Chloraseptic throat spray Cold-Eeze- up to three times per day Cough drops, alcohol free Flonase (by prescription only) Guaifenesin Mucinex Robitussin DM (plain only, alcohol free) Saline nasal spray/drops Sudafed (pseudoephedrine) & Actifed ** use only after [redacted] weeks gestation and if you do not have high blood pressure Tylenol Vicks Vaporub Zinc lozenges Zyrtec   Constipation: Colace Ducolax suppositories Fleet enema Glycerin suppositories Metamucil Milk of magnesia Miralax Senokot Smooth move tea  Diarrhea: Kaopectate Imodium A-D  *NO pepto Bismol  Hemorrhoids: Anusol Anusol HC Preparation H Tucks  Indigestion: Tums Maalox Mylanta Zantac  Pepcid  Insomnia: Benadryl (alcohol free) 25mg  every 6 hours as needed Tylenol PM Unisom, no Gelcaps  Leg  Cramps: Tums MagGel  Nausea/Vomiting:  Bonine Dramamine Emetrol Ginger extract Sea bands Meclizine  Nausea medication to take during pregnancy:  Unisom (doxylamine succinate 25 mg tablets) Take one tablet daily at bedtime. If symptoms are not adequately controlled, the dose can be increased to a maximum recommended dose of two tablets daily (1/2 tablet in the morning, 1/2 tablet mid-afternoon and one at bedtime). Vitamin B6 100mg  tablets. Take one tablet twice a day (up to 200 mg per day).  Skin Rashes: Aveeno products Benadryl cream or 25mg  every 6 hours as needed Calamine Lotion 1% cortisone cream  Yeast infection: Gyne-lotrimin 7 Monistat 7   **If taking multiple medications, please check labels to avoid duplicating the same active ingredients **take medication as directed on the label ** Do not exceed 4000 mg of tylenol in 24 hours **Do not take medications that contain aspirin or ibuprofen         For prevention of migraines in pregnancy: -Magnesium, 400mg  by mouth, once daily -Vitamin B2, 400mg  by mouth, once daily  For treatment of migraines in pregnancy: -start with 1000mg  Tylenol (do not exceed 4000mg  of Tylenol in 24hrs), with some caffeine

## 2021-02-13 LAB — PROTEIN / CREATININE RATIO, URINE
Creatinine, Urine: 43.7 mg/dL
Protein, Ur: 4 mg/dL
Protein/Creat Ratio: 92 mg/g creat (ref 0–200)

## 2021-02-14 LAB — COMPREHENSIVE METABOLIC PANEL
ALT: 21 IU/L (ref 0–32)
AST: 17 IU/L (ref 0–40)
Albumin/Globulin Ratio: 1.7 (ref 1.2–2.2)
Albumin: 4.6 g/dL (ref 3.9–5.0)
Alkaline Phosphatase: 63 IU/L (ref 44–121)
BUN/Creatinine Ratio: 11 (ref 9–23)
BUN: 5 mg/dL — ABNORMAL LOW (ref 6–20)
Bilirubin Total: 0.3 mg/dL (ref 0.0–1.2)
CO2: 20 mmol/L (ref 20–29)
Calcium: 9.4 mg/dL (ref 8.7–10.2)
Chloride: 101 mmol/L (ref 96–106)
Creatinine, Ser: 0.45 mg/dL — ABNORMAL LOW (ref 0.57–1.00)
Globulin, Total: 2.7 g/dL (ref 1.5–4.5)
Glucose: 82 mg/dL (ref 70–99)
Potassium: 3.7 mmol/L (ref 3.5–5.2)
Sodium: 137 mmol/L (ref 134–144)
Total Protein: 7.3 g/dL (ref 6.0–8.5)
eGFR: 139 mL/min/{1.73_m2} (ref >59)

## 2021-02-14 LAB — URINE CULTURE, OB REFLEX: Organism ID, Bacteria: NO GROWTH

## 2021-02-14 LAB — CERVICOVAGINAL ANCILLARY ONLY
Bacterial Vaginitis (gardnerella): NEGATIVE
Candida Glabrata: NEGATIVE
Candida Vaginitis: NEGATIVE
Chlamydia: NEGATIVE
Comment: NEGATIVE
Comment: NEGATIVE
Comment: NEGATIVE
Comment: NEGATIVE
Comment: NEGATIVE
Comment: NORMAL
Neisseria Gonorrhea: NEGATIVE
Trichomonas: NEGATIVE

## 2021-02-14 LAB — HEMOGLOBIN A1C
Est. average glucose Bld gHb Est-mCnc: 100 mg/dL
Hgb A1c MFr Bld: 5.1 % (ref 4.8–5.6)

## 2021-02-14 LAB — CULTURE, OB URINE

## 2021-02-16 DIAGNOSIS — O99311 Alcohol use complicating pregnancy, first trimester: Secondary | ICD-10-CM | POA: Insufficient documentation

## 2021-02-16 DIAGNOSIS — O99331 Smoking (tobacco) complicating pregnancy, first trimester: Secondary | ICD-10-CM | POA: Insufficient documentation

## 2021-02-16 DIAGNOSIS — Z8709 Personal history of other diseases of the respiratory system: Secondary | ICD-10-CM | POA: Insufficient documentation

## 2021-02-16 DIAGNOSIS — O99210 Obesity complicating pregnancy, unspecified trimester: Secondary | ICD-10-CM | POA: Insufficient documentation

## 2021-02-16 HISTORY — DX: Personal history of other diseases of the respiratory system: Z87.09

## 2021-02-18 LAB — CYTOLOGY - PAP: Diagnosis: NEGATIVE

## 2021-02-19 ENCOUNTER — Other Ambulatory Visit: Payer: Self-pay

## 2021-02-19 ENCOUNTER — Other Ambulatory Visit: Payer: Self-pay | Admitting: *Deleted

## 2021-02-19 ENCOUNTER — Ambulatory Visit (INDEPENDENT_AMBULATORY_CARE_PROVIDER_SITE_OTHER): Payer: Medicaid Other

## 2021-02-19 VITALS — BP 115/70 | HR 77 | Ht 63.0 in | Wt 188.5 lb

## 2021-02-19 DIAGNOSIS — Z3401 Encounter for supervision of normal first pregnancy, first trimester: Secondary | ICD-10-CM

## 2021-02-19 DIAGNOSIS — Z3A12 12 weeks gestation of pregnancy: Secondary | ICD-10-CM

## 2021-02-19 DIAGNOSIS — Z3481 Encounter for supervision of other normal pregnancy, first trimester: Secondary | ICD-10-CM | POA: Diagnosis not present

## 2021-02-19 DIAGNOSIS — Z3491 Encounter for supervision of normal pregnancy, unspecified, first trimester: Secondary | ICD-10-CM

## 2021-02-19 DIAGNOSIS — O99311 Alcohol use complicating pregnancy, first trimester: Secondary | ICD-10-CM

## 2021-02-19 DIAGNOSIS — O99210 Obesity complicating pregnancy, unspecified trimester: Secondary | ICD-10-CM

## 2021-02-19 DIAGNOSIS — O99331 Smoking (tobacco) complicating pregnancy, first trimester: Secondary | ICD-10-CM

## 2021-02-19 NOTE — Progress Notes (Signed)
Patient was assessed and managed by nursing staff during this encounter. I have reviewed the chart and agree with the documentation and plan. I have also made any necessary editorial changes.  Catalina Antigua, MD 02/19/2021 10:39 AM

## 2021-02-19 NOTE — Progress Notes (Signed)
DATING AND VIABILITY SONOGRAM   Gloria Cox is a 25 y.o. year old G1P0 with EDD 09/03/21 which would correlate to [redacted]w[redacted]d weeks gestation.  She has irregular menstrual cycles.   She is here today for a confirmatory initial sonogram.    GESTATION: SINGLETON pregnancy  FETAL ACTIVITY:          Heart rate         172          The fetus is active.  GESTATIONAL AGE AND  BIOMETRICS:  Gestational criteria: Estimated Date of Delivery: 09/03/21 by early ultrasound now at [redacted]w[redacted]d.  Previous Scans:1  GESTATIONAL SAC                    CROWN RUMP LENGTH           56.1 mm         12 weeks                                                                               AVERAGE EGA(BY THIS SCAN):  12 weeks  WORKING EDD( early ultrasound ):  09/03/21     TECHNICIAN COMMENTS:  Single live Iup at [redacted]w[redacted]d by CRL. FHR 172   A copy of this report including all images has been saved and backed up to a second source for retrieval if needed. All measures and details of the anatomical scan, placentation, fluid volume and pelvic anatomy are contained in that report.  Gloria Cox J [name] 02/19/2021 9:40 AM

## 2021-02-19 NOTE — Progress Notes (Signed)
Change in u/s order per MFM. Pt does not need detail, was changed to complete.

## 2021-02-19 NOTE — Addendum Note (Signed)
Addended by: Hamilton Capri on: 02/19/2021 09:55 AM   Modules accepted: Orders

## 2021-03-04 ENCOUNTER — Telehealth: Payer: Self-pay

## 2021-03-04 NOTE — Telephone Encounter (Signed)
Pt called and advised she will need panorama redrawn. Pt agrees to complete this at her next visit.

## 2021-03-07 ENCOUNTER — Other Ambulatory Visit: Payer: Medicaid Other

## 2021-03-07 ENCOUNTER — Ambulatory Visit: Payer: Medicaid Other

## 2021-03-10 ENCOUNTER — Encounter: Payer: Self-pay | Admitting: Obstetrics and Gynecology

## 2021-03-12 ENCOUNTER — Other Ambulatory Visit: Payer: Self-pay

## 2021-03-12 ENCOUNTER — Ambulatory Visit (INDEPENDENT_AMBULATORY_CARE_PROVIDER_SITE_OTHER): Payer: Medicaid Other | Admitting: Obstetrics and Gynecology

## 2021-03-12 VITALS — BP 112/70 | HR 59 | Wt 191.4 lb

## 2021-03-12 DIAGNOSIS — Z3401 Encounter for supervision of normal first pregnancy, first trimester: Secondary | ICD-10-CM

## 2021-03-12 DIAGNOSIS — O99210 Obesity complicating pregnancy, unspecified trimester: Secondary | ICD-10-CM

## 2021-03-12 DIAGNOSIS — Z23 Encounter for immunization: Secondary | ICD-10-CM | POA: Diagnosis not present

## 2021-03-12 MED ORDER — ASPIRIN EC 81 MG PO TBEC: 81.0000 mg | DELAYED_RELEASE_TABLET | Freq: Every day | ORAL | 11 refills | Status: DC

## 2021-03-12 NOTE — Addendum Note (Signed)
Addended by: Leola Brazil on: 03/12/2021 04:50 PM   Modules accepted: Orders

## 2021-03-12 NOTE — Progress Notes (Signed)
° °  PRENATAL VISIT NOTE  Subjective:  Gloria Cox is a 25 y.o. G1P0 at [redacted]w[redacted]d being seen today for ongoing prenatal care.  She is currently monitored for the following issues for this low-risk pregnancy and has Dysthymia; Hyperlipemia; Macromastia; Adjustment disorder with mixed anxiety and depressed mood; Migraine without aura and without status migrainosus, not intractable; Juvenile mammary hypertrophy; HSV-1 (herpes simplex virus 1) infection; Encounter for supervision of normal first pregnancy in first trimester; Obesity in pregnancy; History of asthma; Alcohol use affecting pregnancy in first trimester; and Tobacco use affecting pregnancy in first trimester, antepartum on their problem list.  Patient reports  hip pressure, but not pain .  Contractions: Not present. Vag. Bleeding: None.   . Denies leaking of fluid.   The following portions of the patient's history were reviewed and updated as appropriate: allergies, current medications, past family history, past medical history, past social history, past surgical history and problem list.   Objective:   Vitals:   03/12/21 1544  BP: 112/70  Pulse: (!) 59  Weight: 191 lb 6.4 oz (86.8 kg)    Fetal Status: Fetal Heart Rate (bpm): 154         General:  Alert, oriented and cooperative. Patient is in no acute distress.  Skin: Skin is warm and dry. No rash noted.   Cardiovascular: Normal heart rate noted  Respiratory: Normal respiratory effort, no problems with respiration noted  Abdomen: Soft, gravid, appropriate for gestational age.  Pain/Pressure: Absent     Pelvic: Cervical exam deferred        Extremities: Normal range of motion.  Edema: None  Mental Status: Normal mood and affect. Normal behavior. Normal judgment and thought content.   Assessment and Plan:  Pregnancy: G1P0 at [redacted]w[redacted]d 1. Encounter for supervision of normal first pregnancy in first trimester - Offered and recommended flu shot - pt accepts - Discussed birth control  - pt thinks condoms but may eventually want something hormonal. - Genetic Screening - Prenatal labs drawn as well - MSAFP next time - Anatomy scan scheduled for 1/11.   2. Obesity in pregnancy - and prime, started ldASA  Preterm labor symptoms and general obstetric precautions including but not limited to vaginal bleeding, contractions, leaking of fluid and fetal movement were reviewed in detail with the patient. Please refer to After Visit Summary for other counseling recommendations.   Return for OB VISIT, MD or APP.  Future Appointments  Date Time Provider Department Center  04/09/2021  2:15 PM [facility] US2 [facility]    Milas Hock, MD

## 2021-03-13 ENCOUNTER — Encounter: Payer: Self-pay | Admitting: Obstetrics and Gynecology

## 2021-03-13 LAB — OBSTETRIC PANEL, INCLUDING HIV
Antibody Screen: NEGATIVE
Basophils Absolute: 0 10*3/uL (ref 0.0–0.2)
Basos: 0 %
EOS (ABSOLUTE): 0.3 10*3/uL (ref 0.0–0.4)
Eos: 3 %
HIV Screen 4th Generation wRfx: NONREACTIVE
Hematocrit: 38.7 % (ref 34.0–46.6)
Hemoglobin: 13.1 g/dL (ref 11.1–15.9)
Hepatitis B Surface Ag: NEGATIVE
Immature Grans (Abs): 0.1 10*3/uL (ref 0.0–0.1)
Immature Granulocytes: 1 %
Lymphocytes Absolute: 3.5 10*3/uL — ABNORMAL HIGH (ref 0.7–3.1)
Lymphs: 34 %
MCH: 31.6 pg (ref 26.6–33.0)
MCHC: 33.9 g/dL (ref 31.5–35.7)
MCV: 94 fL (ref 79–97)
Monocytes Absolute: 0.7 10*3/uL (ref 0.1–0.9)
Monocytes: 6 %
Neutrophils Absolute: 5.8 10*3/uL (ref 1.4–7.0)
Neutrophils: 56 %
Platelets: 203 10*3/uL (ref 150–450)
RBC: 4.14 x10E6/uL (ref 3.77–5.28)
RDW: 12.1 % (ref 11.7–15.4)
RPR Ser Ql: NONREACTIVE
Rh Factor: POSITIVE
Rubella Antibodies, IGG: 4.5 index (ref >0.99)
WBC: 10.4 10*3/uL (ref 3.4–10.8)

## 2021-03-13 LAB — HEPATITIS C ANTIBODY: Hep C Virus Ab: <0.1 s/co ratio (ref 0.0–0.9)

## 2021-03-14 ENCOUNTER — Other Ambulatory Visit: Payer: Self-pay

## 2021-03-14 DIAGNOSIS — Z3401 Encounter for supervision of normal first pregnancy, first trimester: Secondary | ICD-10-CM

## 2021-03-14 MED ORDER — BLOOD PRESSURE KIT DEVI: 1.0000 | 0 refills | Status: DC | PRN

## 2021-04-03 ENCOUNTER — Other Ambulatory Visit: Payer: Self-pay | Admitting: *Deleted

## 2021-04-03 DIAGNOSIS — Z3480 Encounter for supervision of other normal pregnancy, unspecified trimester: Secondary | ICD-10-CM

## 2021-04-03 NOTE — Progress Notes (Signed)
U/s order changed per MFM

## 2021-04-09 ENCOUNTER — Other Ambulatory Visit: Payer: Self-pay

## 2021-04-09 ENCOUNTER — Ambulatory Visit: Payer: Medicaid Other | Admitting: *Deleted

## 2021-04-09 ENCOUNTER — Ambulatory Visit: Payer: Medicaid Other | Attending: Obstetrics and Gynecology

## 2021-04-09 VITALS — BP 107/58 | HR 68

## 2021-04-09 DIAGNOSIS — Z363 Encounter for antenatal screening for malformations: Secondary | ICD-10-CM | POA: Insufficient documentation

## 2021-04-09 DIAGNOSIS — O99311 Alcohol use complicating pregnancy, first trimester: Secondary | ICD-10-CM | POA: Insufficient documentation

## 2021-04-09 DIAGNOSIS — O99212 Obesity complicating pregnancy, second trimester: Secondary | ICD-10-CM | POA: Diagnosis not present

## 2021-04-09 DIAGNOSIS — O99331 Smoking (tobacco) complicating pregnancy, first trimester: Secondary | ICD-10-CM | POA: Insufficient documentation

## 2021-04-09 DIAGNOSIS — O99210 Obesity complicating pregnancy, unspecified trimester: Secondary | ICD-10-CM | POA: Insufficient documentation

## 2021-04-09 DIAGNOSIS — Z3A19 19 weeks gestation of pregnancy: Secondary | ICD-10-CM | POA: Insufficient documentation

## 2021-04-09 DIAGNOSIS — Z3480 Encounter for supervision of other normal pregnancy, unspecified trimester: Secondary | ICD-10-CM

## 2021-04-09 DIAGNOSIS — O99332 Smoking (tobacco) complicating pregnancy, second trimester: Secondary | ICD-10-CM | POA: Insufficient documentation

## 2021-04-10 ENCOUNTER — Ambulatory Visit (INDEPENDENT_AMBULATORY_CARE_PROVIDER_SITE_OTHER): Payer: Medicaid Other

## 2021-04-10 ENCOUNTER — Other Ambulatory Visit: Payer: Self-pay | Admitting: *Deleted

## 2021-04-10 VITALS — BP 114/73 | HR 74 | Wt 198.6 lb

## 2021-04-10 DIAGNOSIS — O99212 Obesity complicating pregnancy, second trimester: Secondary | ICD-10-CM

## 2021-04-10 DIAGNOSIS — Z362 Encounter for other antenatal screening follow-up: Secondary | ICD-10-CM

## 2021-04-10 DIAGNOSIS — Z3402 Encounter for supervision of normal first pregnancy, second trimester: Secondary | ICD-10-CM

## 2021-04-10 DIAGNOSIS — Z3A19 19 weeks gestation of pregnancy: Secondary | ICD-10-CM

## 2021-04-10 LAB — POCT URINALYSIS DIPSTICK
Bilirubin, UA: NEGATIVE
Blood, UA: NEGATIVE
Glucose, UA: NEGATIVE
Ketones, UA: NEGATIVE
Leukocytes, UA: NEGATIVE
Nitrite, UA: NEGATIVE
Protein, UA: NEGATIVE
Spec Grav, UA: 1.01 (ref 1.010–1.025)
Urobilinogen, UA: 0.2 E.U./dL
pH, UA: 6 (ref 5.0–8.0)

## 2021-04-10 NOTE — Progress Notes (Signed)
° °  PRENATAL VISIT NOTE  Subjective:  Gloria Cox is a 26 y.o. G1P0 at [redacted]w[redacted]d being seen today for ongoing prenatal care.  She is currently monitored for the following issues for this low-risk pregnancy and has Dysthymia; Hyperlipemia; Macromastia; Adjustment disorder with mixed anxiety and depressed mood; Migraine without aura and without status migrainosus, not intractable; Juvenile mammary hypertrophy; HSV-1 (herpes simplex virus 1) infection; Encounter for supervision of normal first pregnancy in first trimester; Obesity in pregnancy; History of asthma; Alcohol use affecting pregnancy in first trimester; and Tobacco use affecting pregnancy in first trimester, antepartum on their problem list.  Patient reports possible UTI. Denies burning or pain with urination, but just "feels different". No frequency or urgency.  Contractions: Not present. Vag. Bleeding: None.  Movement: Present. Denies leaking of fluid.   The following portions of the patient's history were reviewed and updated as appropriate: allergies, current medications, past family history, past medical history, past social history, past surgical history and problem list.   Objective:   Vitals:   04/10/21 1000  BP: 114/73  Pulse: 74  Weight: 198 lb 9.6 oz (90.1 kg)    Fetal Status: Fetal Heart Rate (bpm): 145   Movement: Present     General:  Alert, oriented and cooperative. Patient is in no acute distress.  Skin: Skin is warm and dry. No rash noted.   Cardiovascular: Normal heart rate noted  Respiratory: Normal respiratory effort, no problems with respiration noted  Abdomen: Soft, gravid, appropriate for gestational age.  Pain/Pressure: Absent     Pelvic: Cervical exam deferred        Extremities: Normal range of motion.     Mental Status: Normal mood and affect. Normal behavior. Normal judgment and thought content.   Assessment and Plan:  Pregnancy: G1P0 at 102w1d 1. Encounter for supervision of normal first pregnancy  in second trimester - Routine OB. Doing well. - Reviewed ultrasound with patient as she has concerns about yesterday's [place]. Reassurance provided given LR NIPS and intracardiac echogenic focus noted on [place]. Has f/u US in 4 weeks - Encouraged more water intake and less soda. Will send urine for culture - Anticipatory guidance for upcoming appointments provided  - POCT urinalysis dipstick - Culture, OB Urine  2. [redacted] weeks gestation of pregnancy   3. Obesity affecting pregnancy in second trimester - bASA  Preterm labor symptoms and general obstetric precautions including but not limited to vaginal bleeding, contractions, leaking of fluid and fetal movement were reviewed in detail with the patient. Please refer to After Visit Summary for other counseling recommendations.   Return in about 4 weeks (around 05/08/2021).  Future Appointments  Date Time Provider Department Center  05/08/2021  9:55 AM Milas Hock, MD [facility] None  06/03/2021  2:45 PM [facility] NURSE [facility]  06/03/2021  3:00 PM [facility] US1 [facility]     Brand Males, CNM 04/10/21 10:59 AM

## 2021-04-10 NOTE — Progress Notes (Signed)
Pt feels she may UTI x 1 week - will leave sample today.

## 2021-04-12 LAB — URINE CULTURE, OB REFLEX

## 2021-04-12 LAB — CULTURE, OB URINE

## 2021-04-16 ENCOUNTER — Encounter: Payer: Self-pay | Admitting: Obstetrics and Gynecology

## 2021-04-17 ENCOUNTER — Other Ambulatory Visit: Payer: Self-pay

## 2021-04-17 ENCOUNTER — Encounter (HOSPITAL_COMMUNITY): Payer: Self-pay

## 2021-04-17 ENCOUNTER — Ambulatory Visit (HOSPITAL_COMMUNITY)
Admission: EM | Admit: 2021-04-17 | Discharge: 2021-04-17 | Disposition: A | Discharge status: Home / Self Care | Payer: Medicaid Other | Attending: Family Medicine | Admitting: Family Medicine

## 2021-04-17 DIAGNOSIS — O219 Vomiting of pregnancy, unspecified: Secondary | ICD-10-CM

## 2021-04-17 DIAGNOSIS — O99512 Diseases of the respiratory system complicating pregnancy, second trimester: Secondary | ICD-10-CM

## 2021-04-17 DIAGNOSIS — Z3A20 20 weeks gestation of pregnancy: Secondary | ICD-10-CM

## 2021-04-17 DIAGNOSIS — U071 COVID-19: Secondary | ICD-10-CM | POA: Diagnosis not present

## 2021-04-17 MED ORDER — VITAMIN B-6 25 MG PO TABS: ORAL_TABLET | ORAL | 0 refills | Status: DC

## 2021-04-17 NOTE — ED Triage Notes (Signed)
Pt presents with c/o nasuea after eating and cold sweats. States she feels weak and feels her ears are full.   States she took 3 at home covid tests that came back positive.

## 2021-04-17 NOTE — ED Provider Notes (Signed)
[facility]   [id number] 04/17/21 Arrival Time: [place] PLAN:  1. COVID-19 virus infection   2. Nausea and vomiting during pregnancy    No signs of dehydration. Does not wish to take anti-virals for COVID. Discussed typical duration of viral illnesses. Begin as needed: Discharge Medication List as of 04/17/2021  4:52 PM     START taking these medications   Details  vitamin B-6 (PYRIDOXINE) 25 MG tablet Take one table 3 to 4 times per day for nausea., Normal         Follow-up Information     [facility] at [facility].   Specialty: Urgent Care Why: As needed. Contact information: [place] SSN-[id number] [phone]                Reviewed expectations re: course of current medical issues. Questions answered. Outlined signs and symptoms indicating need for more acute intervention. Understanding verbalized. After Visit Summary given.   SUBJECTIVE: History from: patient. Gloria Cox is a 26 y.o. female who reports: COVID testing + x 3. Fatigue, nasal cong, chills; x 2-3 d. Denies: difficulty breathing. Normal PO intake without n/v/d. 20 w pregnant. Reports no complications.  OBJECTIVE:  Vitals:   04/17/21 1602  BP: 115/75  Pulse: 71  Resp: 19  Temp: 98.3 F (36.8 C)  TempSrc: Oral    General appearance: alert; no distress Eyes: PERRLA; EOMI; conjunctiva normal HENT: [place]; AT; with nasal congestion Neck: supple  Lungs: speaks full sentences without difficulty; unlabored; clear Extremities: no edema Skin: warm and dry Neurologic: normal gait Psychological: alert and cooperative; normal mood and affect   No Known Allergies  Past Medical History:  Diagnosis Date   Asthma    Dyspepsia    Goiter    Hypothyroidism    [place]    Pre-diabetes    Pre-diabetes    [place]    Thyroiditis, autoimmune    Social History   Socioeconomic History   Marital status: Single    Spouse  name: Not on file   Number of children: Not on file   Years of education: Not on file   Highest education level: Not on file  Occupational History   Not on file  Tobacco Use   Smoking status: Former    Packs/day: 0.10    Types: Cigarettes    Quit date: 03/09/2018    Years since quitting: 3.1   Smokeless tobacco: Never  Vaping Use   Vaping Use: Former   Start date: 01/07/2021   Substances: Nicotine  Substance and Sexual Activity   Alcohol use: Not Currently    Comment: socailly, not while preg   Drug use: Not Currently    Types: Marijuana    Comment: not in past 7 years   Sexual activity: Yes  Other Topics Concern   Not on file  Social History Narrative   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   Jada enjoys make-up, doing hair, and being on her phone.      Lives with her great grandparents. She has siblings outside of the home.   Social Determinants of Health   Financial Resource Strain: Not on file  Food Insecurity: Not on file  Transportation Needs: Not on file  Physical Activity: Not on file  Stress: Not on file  Social Connections: Not on file  Intimate Partner Violence: Not on file   Family History  Problem Relation Age of Onset   Obesity Mother    Hypertension Father    Stroke Father    Obesity Sister    Obesity Brother    Diabetes Maternal Grandmother    Hypertension Maternal Grandmother    Obesity Maternal Grandmother    Diabetes Maternal Grandfather    Hypertension Maternal Grandfather    Heart Problems Maternal Grandfather 108   Diabetes Paternal Grandmother    Hypertension Paternal Grandmother    Diabetes Paternal Grandfather    Hypertension Paternal Grandfather    Past Surgical History:  Procedure Laterality Date   CHOLECYSTECTOMY N/A 12/18/2015   Procedure: LAPAROSCOPIC CHOLECYSTECTOMY;  Surgeon: Rolm Bookbinder, MD;  Location: [facility];  Service: General;  Laterality: N/A;   WISDOM TOOTH  EXTRACTION  03/30/2012     Vanessa Kick, MD 04/17/21 1730

## 2021-05-04 NOTE — Progress Notes (Signed)
° °  PRENATAL VISIT NOTE  Subjective:  Gloria Cox is a 26 y.o. G1P0 at [redacted]w[redacted]d being seen today for ongoing prenatal care.  She is currently monitored for the following issues for this low-risk pregnancy and has Dysthymia; Hyperlipemia; Macromastia; Migraine without aura and without status migrainosus, not intractable; HSV-1 (herpes simplex virus 1) infection; Encounter for supervision of normal first pregnancy in first trimester; Obesity in pregnancy; History of asthma; Alcohol use affecting pregnancy in first trimester; and Tobacco use affecting pregnancy in first trimester, antepartum on their problem list.  Patient reports no complaints.  Contractions: Not present. Vag. Bleeding: None.  Movement: Present. Denies leaking of fluid.   The following portions of the patient's history were reviewed and updated as appropriate: allergies, current medications, past family history, past medical history, past social history, past surgical history and problem list.   Objective:   Vitals:   05/08/21 0948  BP: 113/81  Pulse: 79  Weight: 199 lb 4.8 oz (90.4 kg)    Fetal Status: Fetal Heart Rate (bpm): 150   Movement: Present     General:  Alert, oriented and cooperative. Patient is in no acute distress.  Skin: Skin is warm and dry. No rash noted.   Cardiovascular: Normal heart rate noted  Respiratory: Normal respiratory effort, no problems with respiration noted  Abdomen: Soft, gravid, appropriate for gestational age.  Pain/Pressure: Present     Pelvic: Cervical exam deferred        Extremities: Normal range of motion.  Edema: Trace  Mental Status: Normal mood and affect. Normal behavior. Normal judgment and thought content.   Assessment and Plan:  Pregnancy: G1P0 at [redacted]w[redacted]d 1. Encounter for supervision of normal first pregnancy in first trimester - Anatomy incomplete and EICF seen. NIPS was LR. She declined amnio. Has f/u on 3/7.  - 28w labs discussed for next time.  - Reviewed postpartum  contraception - she condoms until PP visit and then she will consider. She has done Nexplanon in the past and knows she doesn't want that.   2. Obesity in pregnancy  3. HSV-1 (herpes simplex virus 1) infection - Clarified if genital vs oral - she reports oral.   Preterm labor symptoms and general obstetric precautions including but not limited to vaginal bleeding, contractions, leaking of fluid and fetal movement were reviewed in detail with the patient. Please refer to After Visit Summary for other counseling recommendations.   No follow-ups on file.  Future Appointments  Date Time Provider Department Center  06/03/2021  2:45 PM [facility] NURSE [facility]  06/03/2021  3:00 PM [facility] US1 [facility]    Milas Hock, MD

## 2021-05-08 ENCOUNTER — Other Ambulatory Visit: Payer: Self-pay | Admitting: Obstetrics and Gynecology

## 2021-05-08 ENCOUNTER — Encounter: Payer: Self-pay | Admitting: Obstetrics and Gynecology

## 2021-05-08 ENCOUNTER — Other Ambulatory Visit: Payer: Self-pay

## 2021-05-08 ENCOUNTER — Ambulatory Visit (INDEPENDENT_AMBULATORY_CARE_PROVIDER_SITE_OTHER): Payer: Medicaid Other | Admitting: Obstetrics and Gynecology

## 2021-05-08 VITALS — BP 113/81 | HR 79 | Wt 199.3 lb

## 2021-05-08 DIAGNOSIS — O99210 Obesity complicating pregnancy, unspecified trimester: Secondary | ICD-10-CM

## 2021-05-08 DIAGNOSIS — O99311 Alcohol use complicating pregnancy, first trimester: Secondary | ICD-10-CM

## 2021-05-08 DIAGNOSIS — B009 Herpesviral infection, unspecified: Secondary | ICD-10-CM

## 2021-05-08 DIAGNOSIS — O99331 Smoking (tobacco) complicating pregnancy, first trimester: Secondary | ICD-10-CM

## 2021-05-08 DIAGNOSIS — Z3401 Encounter for supervision of normal first pregnancy, first trimester: Secondary | ICD-10-CM

## 2021-05-08 MED ORDER — ASPIRIN EC 81 MG PO TBEC: 81.0000 mg | DELAYED_RELEASE_TABLET | Freq: Every day | ORAL | 3 refills | Status: DC

## 2021-06-03 ENCOUNTER — Other Ambulatory Visit: Payer: Self-pay

## 2021-06-03 ENCOUNTER — Ambulatory Visit: Payer: Medicaid Other | Admitting: *Deleted

## 2021-06-03 ENCOUNTER — Ambulatory Visit: Payer: Medicaid Other | Attending: Obstetrics

## 2021-06-03 VITALS — BP 118/70 | HR 92

## 2021-06-03 DIAGNOSIS — Z362 Encounter for other antenatal screening follow-up: Secondary | ICD-10-CM | POA: Diagnosis not present

## 2021-06-03 DIAGNOSIS — O99311 Alcohol use complicating pregnancy, first trimester: Secondary | ICD-10-CM | POA: Insufficient documentation

## 2021-06-03 DIAGNOSIS — O99210 Obesity complicating pregnancy, unspecified trimester: Secondary | ICD-10-CM | POA: Insufficient documentation

## 2021-06-03 DIAGNOSIS — O99332 Smoking (tobacco) complicating pregnancy, second trimester: Secondary | ICD-10-CM

## 2021-06-03 DIAGNOSIS — F1099 Alcohol use, unspecified with unspecified alcohol-induced disorder: Secondary | ICD-10-CM

## 2021-06-03 DIAGNOSIS — Z3A26 26 weeks gestation of pregnancy: Secondary | ICD-10-CM

## 2021-06-03 DIAGNOSIS — F17200 Nicotine dependence, unspecified, uncomplicated: Secondary | ICD-10-CM

## 2021-06-03 DIAGNOSIS — O99331 Smoking (tobacco) complicating pregnancy, first trimester: Secondary | ICD-10-CM | POA: Insufficient documentation

## 2021-06-03 DIAGNOSIS — O99312 Alcohol use complicating pregnancy, second trimester: Secondary | ICD-10-CM | POA: Diagnosis not present

## 2021-06-04 ENCOUNTER — Other Ambulatory Visit: Payer: Self-pay | Admitting: *Deleted

## 2021-06-04 DIAGNOSIS — O283 Abnormal ultrasonic finding on antenatal screening of mother: Secondary | ICD-10-CM

## 2021-06-04 DIAGNOSIS — R638 Other symptoms and signs concerning food and fluid intake: Secondary | ICD-10-CM

## 2021-06-05 ENCOUNTER — Ambulatory Visit (INDEPENDENT_AMBULATORY_CARE_PROVIDER_SITE_OTHER): Payer: Medicaid Other | Admitting: Family Medicine

## 2021-06-05 ENCOUNTER — Other Ambulatory Visit: Payer: Self-pay

## 2021-06-05 ENCOUNTER — Other Ambulatory Visit: Payer: Medicaid Other

## 2021-06-05 ENCOUNTER — Encounter: Payer: Self-pay | Admitting: Family Medicine

## 2021-06-05 VITALS — BP 112/72 | HR 73 | Wt 206.2 lb

## 2021-06-05 DIAGNOSIS — O99210 Obesity complicating pregnancy, unspecified trimester: Secondary | ICD-10-CM

## 2021-06-05 DIAGNOSIS — Z3A27 27 weeks gestation of pregnancy: Secondary | ICD-10-CM

## 2021-06-05 DIAGNOSIS — Z23 Encounter for immunization: Secondary | ICD-10-CM

## 2021-06-05 DIAGNOSIS — Z3401 Encounter for supervision of normal first pregnancy, first trimester: Secondary | ICD-10-CM

## 2021-06-05 NOTE — Progress Notes (Signed)
? ? ?  Subjective:  ?Gloria Cox is a 26 y.o. G1P0 at [redacted]w[redacted]d being seen today for ongoing prenatal care.  She is currently monitored for the following issues for this low-risk pregnancy and has Dysthymia; Hyperlipemia; Macromastia; Migraine without aura and without status migrainosus, not intractable; HSV-1 (herpes simplex virus 1) infection; Encounter for supervision of normal first pregnancy in first trimester; Obesity in pregnancy; History of asthma; Alcohol use affecting pregnancy in first trimester; and Tobacco use affecting pregnancy in first trimester, antepartum on their problem list. ? ?Patient reports no complaints with exception of round ligament pain and discomfort when fetal positioning is.  Contractions: Not present. Vag. Bleeding: None.  Movement: Present. Denies leaking of fluid.  ? ?The following portions of the patient's history were reviewed and updated as appropriate: allergies, current medications, past family history, past medical history, past social history, past surgical history and problem list.  ? ?Objective:  ? ?Vitals:  ? 06/05/21 0959  ?BP: 112/72  ?Pulse: 73  ?Weight: 206 lb 3.2 oz (93.5 kg)  ? ? ?Fetal Status: Fetal Heart Rate (bpm): 142 Fundal Height: 28 cm Movement: Present    ? ?General:  Alert, oriented and cooperative. Patient is in no acute distress.  ?Skin: Skin is warm and dry. No rash noted.   ?Cardiovascular: Normal heart rate noted  ?Respiratory: Normal respiratory effort, no problems with respiration noted  ?Abdomen: Soft, gravid, appropriate for gestational age. Pain/Pressure: Present     ?Pelvic:  Cervical exam deferred        ?Extremities: Normal range of motion.  Edema: Trace  ?Mental Status: Normal mood and affect. Normal behavior. Normal judgment and thought content.  ? ? ?Assessment and Plan:  ?Pregnancy: G1P0 at [redacted]w[redacted]d ? ?1. Encounter for supervision of normal first pregnancy in first trimester ?Doing well with normal fetal movement. Encouraged belly  band/pregnancy tape.  ? ?2. [redacted] weeks gestation of pregnancy ?- Glucose Tolerance, 2 Hours w/1 Hour ?- RPR ?- HIV Antibody (routine testing w rflx) ?- CBC ?- Tdap vaccine greater than or equal to 7yo IM ? ?3. Obesity in pregnancy ?Recent appropriate fetal growth on 3/7, f/u US in 6 weeks per MFM.  ? ?Preterm labor symptoms and general obstetric precautions including but not limited to vaginal bleeding, contractions, leaking of fluid and fetal movement were reviewed in detail with the patient. ?Please refer to After Visit Summary for other counseling recommendations.  ? ?Return in about 3 weeks (around 06/26/2021) for LROB. ? ? ?Allayne Stack, DO ?

## 2021-06-05 NOTE — Progress Notes (Signed)
Pt presents for ROB and 2 gtt labs. ?Tdap given LD without difficulty. ? GAD7=5 ?PHQ9= 5 ?

## 2021-06-06 LAB — RPR: RPR Ser Ql: NONREACTIVE

## 2021-06-06 LAB — CBC
Hematocrit: 38.1 % (ref 34.0–46.6)
Hemoglobin: 12.9 g/dL (ref 11.1–15.9)
MCH: 31.5 pg (ref 26.6–33.0)
MCHC: 33.9 g/dL (ref 31.5–35.7)
MCV: 93 fL (ref 79–97)
Platelets: 199 10*3/uL (ref 150–450)
RBC: 4.1 x10E6/uL (ref 3.77–5.28)
RDW: 11.9 % (ref 11.7–15.4)
WBC: 11 10*3/uL — ABNORMAL HIGH (ref 3.4–10.8)

## 2021-06-06 LAB — HIV ANTIBODY (ROUTINE TESTING W REFLEX): HIV Screen 4th Generation wRfx: NONREACTIVE

## 2021-06-06 LAB — GLUCOSE TOLERANCE, 2 HOURS W/ 1HR
Glucose, 1 hour: 148 mg/dL (ref 70–179)
Glucose, 2 hour: 56 mg/dL — ABNORMAL LOW (ref 70–152)
Glucose, Fasting: 87 mg/dL (ref 70–91)

## 2021-06-26 ENCOUNTER — Ambulatory Visit (INDEPENDENT_AMBULATORY_CARE_PROVIDER_SITE_OTHER): Payer: Medicaid Other | Admitting: Obstetrics

## 2021-06-26 ENCOUNTER — Encounter: Payer: Self-pay | Admitting: Obstetrics

## 2021-06-26 VITALS — BP 130/81 | HR 79 | Wt 216.9 lb

## 2021-06-26 DIAGNOSIS — O99311 Alcohol use complicating pregnancy, first trimester: Secondary | ICD-10-CM

## 2021-06-26 DIAGNOSIS — O99210 Obesity complicating pregnancy, unspecified trimester: Secondary | ICD-10-CM

## 2021-06-26 DIAGNOSIS — Z3400 Encounter for supervision of normal first pregnancy, unspecified trimester: Secondary | ICD-10-CM

## 2021-06-26 DIAGNOSIS — J301 Allergic rhinitis due to pollen: Secondary | ICD-10-CM

## 2021-06-26 DIAGNOSIS — O99331 Smoking (tobacco) complicating pregnancy, first trimester: Secondary | ICD-10-CM

## 2021-06-26 MED ORDER — LORATADINE 10 MG PO TABS: 10.0000 mg | ORAL_TABLET | Freq: Every day | ORAL | 11 refills | Status: DC

## 2021-06-26 NOTE — Progress Notes (Signed)
Pt in office for ROB visit. She does not have any concerns today.  ?

## 2021-06-26 NOTE — Progress Notes (Signed)
Subjective:  ?Gloria Cox is a 26 y.o. G1P0 at [redacted]w[redacted]d being seen today for ongoing prenatal care.  She is currently monitored for the following issues for this low-risk pregnancy and has Dysthymia; Hyperlipemia; Macromastia; Migraine without aura and without status migrainosus, not intractable; HSV-1 (herpes simplex virus 1) infection; Encounter for supervision of normal first pregnancy in first trimester; Obesity in pregnancy; History of asthma; Alcohol use affecting pregnancy in first trimester; and Tobacco use affecting pregnancy in first trimester, antepartum on their problem list. ? ?Patient reports  seasonal allergies .  Contractions: Not present. Vag. Bleeding: None.  Movement: Present. Denies leaking of fluid.  ? ?The following portions of the patient's history were reviewed and updated as appropriate: allergies, current medications, past family history, past medical history, past social history, past surgical history and problem list. Problem list updated. ? ?Objective:  ? ?Vitals:  ? 06/26/21 0835  ?BP: 130/81  ?Pulse: 79  ?Weight: 216 lb 14.4 oz (98.4 kg)  ? ? ?Fetal Status: Fetal Heart Rate (bpm): 137   Movement: Present    ? ?General:  Alert, oriented and cooperative. Patient is in no acute distress.  ?Skin: Skin is warm and dry. No rash noted.   ?Cardiovascular: Normal heart rate noted  ?Respiratory: Normal respiratory effort, no problems with respiration noted  ?Abdomen: Soft, gravid, appropriate for gestational age. Pain/Pressure: Present     ?Pelvic:  Cervical exam deferred        ?Extremities: Normal range of motion.  Edema: Trace  ?Mental Status: Normal mood and affect. Normal behavior. Normal judgment and thought content.  ? ?Urinalysis:     ? ?Assessment and Plan:  ?Pregnancy: G1P0 at [redacted]w[redacted]d ? ?1. Supervision of normal first pregnancy, antepartum ? ?2. Alcohol use affecting pregnancy in first trimester ? ?3. Tobacco use affecting pregnancy in first trimester, antepartum ? ?4. Obesity in  pregnancy ? ?5. Seasonal allergic rhinitis due to pollen ?Rx: ?- loratadine (CLARITIN) 10 MG tablet; Take 1 tablet (10 mg total) by mouth daily.  Dispense: 30 tablet; Refill: 11 ? ?Preterm labor symptoms and general obstetric precautions including but not limited to vaginal bleeding, contractions, leaking of fluid and fetal movement were reviewed in detail with the patient. ?Please refer to After Visit Summary for other counseling recommendations.  ? ?Return in about 2 weeks (around 07/10/2021) for ROB. ? ? ?Brock Bad, MD  ?06/26/21  ?

## 2021-07-10 ENCOUNTER — Ambulatory Visit (INDEPENDENT_AMBULATORY_CARE_PROVIDER_SITE_OTHER): Payer: Medicaid Other | Admitting: Obstetrics and Gynecology

## 2021-07-10 VITALS — BP 125/82 | HR 74 | Wt 214.7 lb

## 2021-07-10 DIAGNOSIS — Z3403 Encounter for supervision of normal first pregnancy, third trimester: Secondary | ICD-10-CM

## 2021-07-10 DIAGNOSIS — B009 Herpesviral infection, unspecified: Secondary | ICD-10-CM

## 2021-07-10 DIAGNOSIS — Z8709 Personal history of other diseases of the respiratory system: Secondary | ICD-10-CM

## 2021-07-10 DIAGNOSIS — Z3A32 32 weeks gestation of pregnancy: Secondary | ICD-10-CM

## 2021-07-10 NOTE — Progress Notes (Signed)
? ?  PRENATAL VISIT NOTE ? ?Subjective:  ?Gloria Cox is a 26 y.o. G1P0 at [redacted]w[redacted]d being seen today for ongoing prenatal care.  She is currently monitored for the following issues for this low-risk pregnancy and has Dysthymia; Hyperlipemia; Macromastia; Migraine without aura and without status migrainosus, not intractable; HSV-1 (herpes simplex virus 1) infection; Encounter for supervision of normal first pregnancy in third trimester; Obesity in pregnancy; History of asthma; Alcohol use affecting pregnancy in first trimester; and Tobacco use affecting pregnancy in first trimester, antepartum on their problem list. ? ?Patient doing well with no acute concerns today. She reports no complaints.  Contractions: Not present. Vag. Bleeding: None.  Movement: Present. Denies leaking of fluid.  ? ?The following portions of the patient's history were reviewed and updated as appropriate: allergies, current medications, past family history, past medical history, past social history, past surgical history and problem list. Problem list updated. ? ?Objective:  ? ?Vitals:  ? 07/10/21 1334  ?BP: 125/82  ?Pulse: 74  ?Weight: 214 lb 11.2 oz (97.4 kg)  ? ? ?Fetal Status: Fetal Heart Rate (bpm): 141 Fundal Height: 33 cm Movement: Present    ? ?General:  Alert, oriented and cooperative. Patient is in no acute distress.  ?Skin: Skin is warm and dry. No rash noted.   ?Cardiovascular: Normal heart rate noted  ?Respiratory: Normal respiratory effort, no problems with respiration noted  ?Abdomen: Soft, gravid, appropriate for gestational age.  Pain/Pressure: Present     ?Pelvic: Cervical exam deferred        ?Extremities: Normal range of motion.  Edema: Trace  ?Mental Status:  Normal mood and affect. Normal behavior. Normal judgment and thought content.  ? ?Assessment and Plan:  ?Pregnancy: G1P0 at [redacted]w[redacted]d ? ?1. [redacted] weeks gestation of pregnancy ? ? ?2. HSV-1 (herpes simplex virus 1) infection ?Consider prophylaxis if hx of outbreak, at 36  weeks ? ?3. History of asthma ?No report of worsening symptoms ? ?4. Encounter for supervision of normal first pregnancy in third trimester ?Continue routine care, growth scan on 07/15/21 ? ?Preterm labor symptoms and general obstetric precautions including but not limited to vaginal bleeding, contractions, leaking of fluid and fetal movement were reviewed in detail with the patient. ? ?Please refer to After Visit Summary for other counseling recommendations.  ? ?Return in about 2 weeks (around 07/24/2021) for ROB, in person. ? ? ?Lynnda Shields, MD ?Faculty Attending ?[facility] ?  ?

## 2021-07-15 ENCOUNTER — Ambulatory Visit: Payer: Medicaid Other | Attending: Obstetrics

## 2021-07-15 ENCOUNTER — Ambulatory Visit: Payer: Medicaid Other | Admitting: *Deleted

## 2021-07-15 VITALS — BP 128/82 | HR 107

## 2021-07-15 DIAGNOSIS — O99331 Smoking (tobacco) complicating pregnancy, first trimester: Secondary | ICD-10-CM | POA: Insufficient documentation

## 2021-07-15 DIAGNOSIS — R638 Other symptoms and signs concerning food and fluid intake: Secondary | ICD-10-CM | POA: Insufficient documentation

## 2021-07-15 DIAGNOSIS — O99311 Alcohol use complicating pregnancy, first trimester: Secondary | ICD-10-CM | POA: Insufficient documentation

## 2021-07-15 DIAGNOSIS — O99210 Obesity complicating pregnancy, unspecified trimester: Secondary | ICD-10-CM

## 2021-07-15 DIAGNOSIS — Z3A32 32 weeks gestation of pregnancy: Secondary | ICD-10-CM

## 2021-07-15 DIAGNOSIS — F17200 Nicotine dependence, unspecified, uncomplicated: Secondary | ICD-10-CM

## 2021-07-15 DIAGNOSIS — O99213 Obesity complicating pregnancy, third trimester: Secondary | ICD-10-CM | POA: Diagnosis not present

## 2021-07-15 DIAGNOSIS — O283 Abnormal ultrasonic finding on antenatal screening of mother: Secondary | ICD-10-CM | POA: Insufficient documentation

## 2021-07-15 DIAGNOSIS — F1099 Alcohol use, unspecified with unspecified alcohol-induced disorder: Secondary | ICD-10-CM

## 2021-07-15 DIAGNOSIS — O99313 Alcohol use complicating pregnancy, third trimester: Secondary | ICD-10-CM | POA: Diagnosis not present

## 2021-07-15 DIAGNOSIS — O99330 Smoking (tobacco) complicating pregnancy, unspecified trimester: Secondary | ICD-10-CM

## 2021-07-15 DIAGNOSIS — Z362 Encounter for other antenatal screening follow-up: Secondary | ICD-10-CM

## 2021-07-24 ENCOUNTER — Ambulatory Visit (INDEPENDENT_AMBULATORY_CARE_PROVIDER_SITE_OTHER): Payer: Medicaid Other

## 2021-07-24 VITALS — BP 126/83 | HR 77 | Wt 224.0 lb

## 2021-07-24 DIAGNOSIS — O99210 Obesity complicating pregnancy, unspecified trimester: Secondary | ICD-10-CM

## 2021-07-24 DIAGNOSIS — Z3A34 34 weeks gestation of pregnancy: Secondary | ICD-10-CM

## 2021-07-24 DIAGNOSIS — Z3400 Encounter for supervision of normal first pregnancy, unspecified trimester: Secondary | ICD-10-CM

## 2021-07-24 NOTE — Progress Notes (Signed)
? ?  LOW-RISK PREGNANCY OFFICE VISIT ? ?Patient name: Gloria Cox MRN [id number]  Date of birth: [birth year] ?Chief Complaint:   ?Routine Prenatal Visit ? ?Subjective:   ?Gloria Cox is a 26 y.o. G1P0 female at [redacted]w[redacted]d with an Estimated Date of Delivery: 09/03/21 being seen today for ongoing management of a low-risk pregnancy aeb has Dysthymia; Hyperlipemia; Macromastia; Migraine without aura and without status migrainosus, not intractable; HSV-1 (herpes simplex virus 1) infection; Encounter for supervision of normal first pregnancy in third trimester; Obesity in pregnancy; History of asthma; Alcohol use affecting pregnancy in first trimester; and Tobacco use affecting pregnancy in first trimester, antepartum on their problem list. ? ?Patient presents today, alone, with no complaints.  Patient endorses fetal movement. Patient reports some irregular abdominal cramping, but denies contractions.  Patient denies vaginal concerns including abnormal discharge, leaking of fluid, and bleeding.  ? ?Patient reports feeling alone as none of her friends have kids and so she is not as social as before.  She states she does not feel depressed.     ? ?Contractions: Not present. Vag. Bleeding: None.  Movement: Present. ? ?Reviewed past medical,surgical, social, obstetrical and family history as well as problem list, medications and allergies. ? ?Objective  ? ?Vitals:  ? 07/24/21 0959  ?BP: 126/83  ?Pulse: 77  ?Weight: 224 lb (101.6 kg)  ?Body mass index is 39.68 kg/m?.  ?Total Weight Gain:64 lb (29 kg) ? ?  ?     Physical Examination:  ? General appearance: Well appearing, and in no distress ? Mental status: Alert, oriented to person, place, and time ? Skin: Warm & dry ? Cardiovascular: Normal heart rate noted ? Respiratory: Normal respiratory effort, no distress ?Abdomen: Soft, gravid, nontender, AGA with Fundal Height: 35 cm ? Pelvic: Cervical exam deferred          ? Extremities: Edema: Trace ? ?Fetal Status: Fetal Heart Rate  (bpm): 141  Movement: Present  ? ?No results found for this or any previous visit (from the past 24 hour(s)).  ?Assessment & Plan:  ?Low-risk pregnancy of a 26 y.o., G1P0 at [redacted]w[redacted]d with an Estimated Date of Delivery: 09/03/21  ? ?1. Supervision of normal first pregnancy, antepartum ?-Anticipatory guidance for upcoming appts. ?-Patient to schedule next appt in 2 weeks for an in-person visit. ?-Educated on GBS bacteria including what it is, why we test, and how and when we treat if needed. ?-Plan to collect at next visit with STD testing per protocol.  ? ?2. [redacted] weeks gestation of pregnancy ?-Doing well.  ?-Discussed joining a mother focused group for further support.  ?-Informed of availability of [facility] if symptoms of depression arise or need for resources.  ? ?3. Obesity in pregnancy ?-BMI 39.68 today. ?-TWG 64 lbs ?-Taking baby aspirin. ? ?  ?Meds: No orders of the defined types were placed in this encounter. ? ?Labs/procedures today:  ?Lab Orders  ?No laboratory test(s) ordered today  ?  ? ?Reviewed: Preterm labor symptoms and general obstetric precautions including but not limited to vaginal bleeding, contractions, leaking of fluid and fetal movement were reviewed in detail with the patient.  All questions were answered. ? ?Follow-up: No follow-ups on file. ? ?No orders of the defined types were placed in this encounter. ? ?Cherre Robins MSN, CNM ?07/24/2021  ? ?

## 2021-08-06 NOTE — Progress Notes (Signed)
? ?  PRENATAL VISIT NOTE ? ?Subjective:  ?Gloria Cox is a 26 y.o. G1P0 at [redacted]w[redacted]d being seen today for ongoing prenatal care.  She is currently monitored for the following issues for this low-risk pregnancy and has Dysthymia; Hyperlipemia; Macromastia; Migraine without aura and without status migrainosus, not intractable; HSV-1 (herpes simplex virus 1) infection; Encounter for supervision of normal first pregnancy in third trimester; Obesity in pregnancy; History of asthma; Alcohol use affecting pregnancy in first trimester; and Tobacco use affecting pregnancy in first trimester, antepartum on their problem list. ? ?Patient reports no complaints.  Contractions: Irritability. Vag. Bleeding: None.  Movement: Present. Denies leaking of fluid.  ? ?The following portions of the patient's history were reviewed and updated as appropriate: allergies, current medications, past family history, past medical history, past social history, past surgical history and problem list.  ? ?Objective:  ? ?Vitals:  ? 08/07/21 1339  ?BP: 125/84  ?Pulse: 73  ?Weight: 228 lb 1.6 oz (103.5 kg)  ? ? ?Fetal Status: Fetal Heart Rate (bpm): 140 Fundal Height: 37 cm Movement: Present    ? ?General:  Alert, oriented and cooperative. Patient is in no acute distress.  ?Skin: Skin is warm and dry. No rash noted.   ?Cardiovascular: Normal heart rate noted  ?Respiratory: Normal respiratory effort, no problems with respiration noted  ?Abdomen: Soft, gravid, appropriate for gestational age.  Pain/Pressure: Absent     ?Pelvic: Cervical exam deferred        ?Extremities: Normal range of motion.  Edema: Trace  ?Mental Status: Normal mood and affect. Normal behavior. Normal judgment and thought content.  ? ?Assessment and Plan:  ?Pregnancy: G1P0 at [redacted]w[redacted]d ? ? ? ?1. Encounter for supervision of normal first pregnancy in third trimester   ?2. [redacted] weeks gestation of pregnancy   ? ?-gbs collected today ?-vertex by leopolds ?-reviewed treatment for gbs ?-patient  would like to try to BF, offered support and encouragement ?-patient would like condoms for birth control, "hormones mess with my body" and she feels she has a hard time getting pregnant so she wants to use condoms ?Term labor symptoms and general obstetric precautions including but not limited to vaginal bleeding, contractions, leaking of fluid and fetal movement were reviewed in detail with the patient. ?Please refer to After Visit Summary for other counseling recommendations.  ? ?1. Encounter for supervision of normal first pregnancy in third trimester   ?2. [redacted] weeks gestation of pregnancy   ? ? ?No future appointments. ? ? ?Starr Lake, CNM ? ?

## 2021-08-07 ENCOUNTER — Other Ambulatory Visit (HOSPITAL_COMMUNITY)
Admission: RE | Admit: 2021-08-07 | Discharge: 2021-08-07 | Disposition: A | Discharge status: Home / Self Care | Payer: Medicaid Other | Source: Ambulatory Visit | Attending: Student | Admitting: Student

## 2021-08-07 ENCOUNTER — Encounter: Payer: Self-pay | Admitting: Student

## 2021-08-07 ENCOUNTER — Ambulatory Visit (INDEPENDENT_AMBULATORY_CARE_PROVIDER_SITE_OTHER): Payer: Medicaid Other | Admitting: Student

## 2021-08-07 VITALS — BP 125/84 | HR 73 | Wt 228.1 lb

## 2021-08-07 DIAGNOSIS — Z3403 Encounter for supervision of normal first pregnancy, third trimester: Secondary | ICD-10-CM

## 2021-08-07 DIAGNOSIS — Z3A36 36 weeks gestation of pregnancy: Secondary | ICD-10-CM

## 2021-08-08 LAB — CERVICOVAGINAL ANCILLARY ONLY
Bacterial Vaginitis (gardnerella): NEGATIVE
Candida Glabrata: NEGATIVE
Candida Vaginitis: NEGATIVE
Chlamydia: NEGATIVE
Comment: NEGATIVE
Comment: NEGATIVE
Comment: NEGATIVE
Comment: NEGATIVE
Comment: NEGATIVE
Comment: NORMAL
Neisseria Gonorrhea: NEGATIVE
Trichomonas: NEGATIVE

## 2021-08-11 LAB — CULTURE, BETA STREP (GROUP B ONLY): Strep Gp B Culture: NEGATIVE

## 2021-08-20 ENCOUNTER — Ambulatory Visit (INDEPENDENT_AMBULATORY_CARE_PROVIDER_SITE_OTHER): Payer: Medicaid Other | Admitting: Student

## 2021-08-20 ENCOUNTER — Encounter: Payer: Self-pay | Admitting: Student

## 2021-08-20 VITALS — BP 134/85 | HR 78 | Wt 236.0 lb

## 2021-08-20 DIAGNOSIS — O99210 Obesity complicating pregnancy, unspecified trimester: Secondary | ICD-10-CM

## 2021-08-20 DIAGNOSIS — B009 Herpesviral infection, unspecified: Secondary | ICD-10-CM

## 2021-08-20 DIAGNOSIS — Z3A38 38 weeks gestation of pregnancy: Secondary | ICD-10-CM

## 2021-08-20 DIAGNOSIS — Z3403 Encounter for supervision of normal first pregnancy, third trimester: Secondary | ICD-10-CM

## 2021-08-20 NOTE — Progress Notes (Signed)
Pt reports fetal movement with some hip pain. 

## 2021-08-20 NOTE — Progress Notes (Signed)
   PRENATAL VISIT NOTE  Subjective:  Gloria Cox is a 26 y.o. G1P0 at [redacted]w[redacted]d being seen today for ongoing prenatal care.  She is currently monitored for the following issues for this high-risk pregnancy and has Dysthymia; Hyperlipemia; Macromastia; Migraine without aura and without status migrainosus, not intractable; HSV-1 (herpes simplex virus 1) infection; Encounter for supervision of normal first pregnancy in third trimester; Obesity in pregnancy; History of asthma; Alcohol use affecting pregnancy in first trimester; and Tobacco use affecting pregnancy in first trimester, antepartum on their problem list.  Patient reports occasional contractions.  Contractions: Irritability. Vag. Bleeding: None.  Movement: Present. Denies leaking of fluid.   The following portions of the patient's history were reviewed and updated as appropriate: allergies, current medications, past family history, past medical history, past social history, past surgical history and problem list.   Objective:   Vitals:   08/20/21 1513  BP: 134/85  Pulse: 78  Weight: 236 lb (107 kg)    Fetal Status: Fetal Heart Rate (bpm): 150   Movement: Present     General:  Alert, oriented and cooperative. Patient is in no acute distress.  Skin: Skin is warm and dry. No rash noted.   Cardiovascular: Normal heart rate noted  Respiratory: Normal respiratory effort, no problems with respiration noted  Abdomen: Soft, gravid, appropriate for gestational age.  Pain/Pressure: Present     Pelvic: Cervical exam deferred        Extremities: Normal range of motion.  Edema: Trace  Mental Status: Normal mood and affect. Normal behavior. Normal judgment and thought content.   Assessment and Plan:  Pregnancy: G1P0 at [redacted]w[redacted]d 1. Encounter for supervision of normal first pregnancy in third trimester - GBS negative discussed  - BH contractions about 3x/day   2. HSV-1 (herpes simplex virus 1) infection  3. Obesity in pregnancy  4. [redacted]  weeks gestation of pregnancy  Term labor symptoms and general obstetric precautions including but not limited to vaginal bleeding, contractions, leaking of fluid and fetal movement were reviewed in detail with the patient. Please refer to After Visit Summary for other counseling recommendations.   Return in about 1 week (around 08/27/2021) for LOB, any provider, In-Person.  Future Appointments  Date Time Provider [place]  08/28/2021  9:35 AM Shelly Bombard, MD [place] None    Kerry Hough, PA-C

## 2021-08-28 ENCOUNTER — Ambulatory Visit (INDEPENDENT_AMBULATORY_CARE_PROVIDER_SITE_OTHER): Payer: Medicaid Other | Admitting: Obstetrics

## 2021-08-28 ENCOUNTER — Encounter (HOSPITAL_COMMUNITY): Payer: Self-pay

## 2021-08-28 ENCOUNTER — Encounter: Payer: Self-pay | Admitting: Obstetrics

## 2021-08-28 VITALS — BP 119/81 | HR 76 | Wt 239.6 lb

## 2021-08-28 DIAGNOSIS — O99331 Smoking (tobacco) complicating pregnancy, first trimester: Secondary | ICD-10-CM

## 2021-08-28 DIAGNOSIS — O99210 Obesity complicating pregnancy, unspecified trimester: Secondary | ICD-10-CM

## 2021-08-28 DIAGNOSIS — Z3403 Encounter for supervision of normal first pregnancy, third trimester: Secondary | ICD-10-CM

## 2021-08-28 DIAGNOSIS — O99311 Alcohol use complicating pregnancy, first trimester: Secondary | ICD-10-CM

## 2021-08-28 NOTE — Progress Notes (Signed)
Pt reports fetal movement with some contractions. 

## 2021-08-28 NOTE — Progress Notes (Signed)
Subjective:  Gloria Cox is a 26 y.o. G1P0 at [redacted]w[redacted]d being seen today for ongoing prenatal care.  She is currently monitored for the following issues for this low-risk pregnancy and has Dysthymia; Hyperlipemia; Macromastia; Migraine without aura and without status migrainosus, not intractable; HSV-1 (herpes simplex virus 1) infection; Encounter for supervision of normal first pregnancy in third trimester; Obesity in pregnancy; History of asthma; Alcohol use affecting pregnancy in first trimester; and Tobacco use affecting pregnancy in first trimester, antepartum on their problem list.  Patient reports no complaints.  Contractions: Irregular. Vag. Bleeding: None.  Movement: Present. Denies leaking of fluid.   The following portions of the patient's history were reviewed and updated as appropriate: allergies, current medications, past family history, past medical history, past social history, past surgical history and problem list. Problem list updated.  Objective:   Vitals:   08/28/21 0943  BP: 119/81  Pulse: 76  Weight: 239 lb 9.6 oz (108.7 kg)    Fetal Status: Fetal Heart Rate (bpm): 140   Movement: Present     General:  Alert, oriented and cooperative. Patient is in no acute distress.  Skin: Skin is warm and dry. No rash noted.   Cardiovascular: Normal heart rate noted  Respiratory: Normal respiratory effort, no problems with respiration noted  Abdomen: Soft, gravid, appropriate for gestational age. Pain/Pressure: Present     Pelvic:  Cervical exam deferred      Informal ultrasound:  Vertex presentation  Extremities: Normal range of motion.  Edema: Trace  Mental Status: Normal mood and affect. Normal behavior. Normal judgment and thought content.   Urinalysis:      Assessment and Plan:  Pregnancy: G1P0 at [redacted]w[redacted]d  1. Encounter for supervision of normal first pregnancy in third trimester  2. Obesity in pregnancy  3. Alcohol use affecting pregnancy in first trimester  4.  Tobacco use affecting pregnancy in first trimester, antepartum    Term labor symptoms and general obstetric precautions including but not limited to vaginal bleeding, contractions, leaking of fluid and fetal movement were reviewed in detail with the patient. Please refer to After Visit Summary for other counseling recommendations.   Return in about 1 week (around 09/04/2021) for ROB.   Brock Bad, MD  08/28/21

## 2021-08-29 ENCOUNTER — Telehealth (HOSPITAL_COMMUNITY): Payer: Self-pay | Admitting: *Deleted

## 2021-08-29 NOTE — Telephone Encounter (Signed)
Preadmission screen  

## 2021-09-01 ENCOUNTER — Telehealth (HOSPITAL_COMMUNITY): Payer: Self-pay | Admitting: *Deleted

## 2021-09-01 NOTE — Telephone Encounter (Signed)
Preadmission screen  

## 2021-09-02 ENCOUNTER — Telehealth (HOSPITAL_COMMUNITY): Payer: Self-pay | Admitting: *Deleted

## 2021-09-02 NOTE — Telephone Encounter (Signed)
Preadmission screen  

## 2021-09-03 ENCOUNTER — Other Ambulatory Visit: Payer: Self-pay | Admitting: Advanced Practice Midwife

## 2021-09-03 ENCOUNTER — Telehealth (HOSPITAL_COMMUNITY): Payer: Self-pay | Admitting: *Deleted

## 2021-09-03 DIAGNOSIS — O480 Post-term pregnancy: Secondary | ICD-10-CM

## 2021-09-03 NOTE — Telephone Encounter (Signed)
Preadmission screen  

## 2021-09-04 ENCOUNTER — Other Ambulatory Visit: Payer: Self-pay | Admitting: Advanced Practice Midwife

## 2021-09-04 ENCOUNTER — Telehealth (HOSPITAL_COMMUNITY): Payer: Self-pay | Admitting: *Deleted

## 2021-09-04 ENCOUNTER — Ambulatory Visit (INDEPENDENT_AMBULATORY_CARE_PROVIDER_SITE_OTHER): Payer: Medicaid Other | Admitting: Advanced Practice Midwife

## 2021-09-04 ENCOUNTER — Encounter (HOSPITAL_COMMUNITY): Payer: Self-pay | Admitting: *Deleted

## 2021-09-04 VITALS — Wt 243.0 lb

## 2021-09-04 DIAGNOSIS — O480 Post-term pregnancy: Secondary | ICD-10-CM

## 2021-09-04 DIAGNOSIS — Z3403 Encounter for supervision of normal first pregnancy, third trimester: Secondary | ICD-10-CM

## 2021-09-04 DIAGNOSIS — O99210 Obesity complicating pregnancy, unspecified trimester: Secondary | ICD-10-CM

## 2021-09-04 NOTE — Progress Notes (Signed)
Opened in error

## 2021-09-04 NOTE — Telephone Encounter (Incomplete)
Preadmission screen  Pt states she is 5 minutes from her OB office for her appt.  Had a gush of fluid this morning with pink tinged fluid.  No bleeding or leakage since.  Feels baby move this morning.  Instructed pt to continue to her MD office and let them no on check in what is happening.  [facility] called by RN

## 2021-09-04 NOTE — Progress Notes (Signed)
PRENATAL VISIT NOTE  Subjective:  Gloria Cox is a 26 y.o. G1P0 at [redacted]w[redacted]d being seen today for ongoing prenatal care.  She is currently monitored for the following issues for this low-risk pregnancy and has Dysthymia; Hyperlipemia; Macromastia; Migraine without aura and without status migrainosus, not intractable; HSV-1 (herpes simplex virus 1) infection; Encounter for supervision of normal first pregnancy in third trimester; Obesity in pregnancy; History of asthma; Alcohol use affecting pregnancy in first trimester; and Tobacco use affecting pregnancy in first trimester, antepartum on their problem list.  Patient reports  pelvic pressure, LOF and concern for SROM, "just ready to be done and I'm not gonna make it to my induction on the 14th" .  Contractions: Irregular. Vag. Bleeding: Bloody Show.  Movement: Present. Denies leaking of fluid.   The following portions of the patient's history were reviewed and updated as appropriate: allergies, current medications, past family history, past medical history, past social history, past surgical history and problem list. Problem list updated.  Objective:   Vitals:   09/04/21 1003  Weight: 243 lb (110.2 kg)    Fetal Status: Fetal Heart Rate (bpm): 120   Movement: Present  Presentation: Vertex  General:  Alert, oriented and cooperative. Patient is in no acute distress.  Skin: Skin is warm and dry. No rash noted.   Cardiovascular: Normal heart rate noted  Respiratory: Normal respiratory effort, no problems with respiration noted  Abdomen: Soft, gravid, appropriate for gestational age.  Pain/Pressure: Present     Pelvic: Cervical exam performed Dilation: 1 Effacement (%): Thick Station: -3  Extremities: Normal range of motion.  Edema: Trace  Mental Status: Normal mood and affect. Normal behavior. Normal judgment and thought content.  Procedure: Patient informed of R/B/A of procedure. NST was performed and was reactive prior to  procedure. NST:  EFM: Baseline: 120 bpm, Variability: Good {> 6 bpm), Accelerations: Reactive, and Decelerations: Absent Toco: irregular, every 6 minutes Procedure done to begin ripening of the cervix prior to admission for induction of labor. Appropriate time out taken. The patient was placed in the lithotomy position and a cervical exam was performed and a finger was used to guide the 33F foley through the internal os of the cervix. Foley Balloon filled with 60cc of normal saline. Plug inserted into end of the foley. Foley placed on tension and taped to medial thigh.  NST:  EFM Baseline: 120 bpm, Variability: Good {> 6 bpm), Accelerations: Reactive, and Decelerations: Absent  Toco: none There were no signs of tachysystole or hypertonus. All equipment was removed and accounted for. The patient tolerated the procedure well.  Assessment and Plan:  Pregnancy: G1P0 at [redacted]w[redacted]d  1. Encounter for supervision of normal first pregnancy in third trimester - Intact membranes: negative pooling on spec exam, negative nitrazine, negative fern - Cervix 1/thick/-3 per my exam - IOL moved from 06/14 to tomorrow per patient request - Discussed outpatient foley bulb, positive impact impact on LOS, patient agreeable to attempting insertion in office  2. Post term pregnancy over 40 weeks   3. Obesity in pregnancy - BMI 43  4. Labor and delivery indication for care or intervention  - Outpatient foley balloon placed per protocol. See above  Term labor symptoms and general obstetric precautions including but not limited to vaginal bleeding, contractions, leaking of fluid and fetal movement were reviewed in detail with the patient. Please refer to After Visit Summary for other counseling recommendations.    Future Appointments  Date Time Provider Department Center  09/05/2021  6:30 AM [facility] SCHED ROOM [facility] None    Calvert Cantor, CNM

## 2021-09-04 NOTE — Progress Notes (Signed)
ROB 40.1 wks "Gush of fluid', UC's today NST Foley Bulb NST See charting

## 2021-09-05 ENCOUNTER — Inpatient Hospital Stay (HOSPITAL_COMMUNITY): Payer: Medicaid Other | Admitting: Anesthesiology

## 2021-09-05 ENCOUNTER — Encounter (HOSPITAL_COMMUNITY): Payer: Self-pay | Admitting: Obstetrics and Gynecology

## 2021-09-05 ENCOUNTER — Inpatient Hospital Stay (HOSPITAL_COMMUNITY): Payer: Medicaid Other

## 2021-09-05 ENCOUNTER — Inpatient Hospital Stay (HOSPITAL_COMMUNITY)
Admission: AD | Admit: 2021-09-05 | Discharge: 2021-09-09 | DRG: 787 | Disposition: A | Discharge status: Home / Self Care | Payer: Medicaid Other | Attending: Obstetrics and Gynecology | Admitting: Obstetrics and Gynecology

## 2021-09-05 DIAGNOSIS — Z3A40 40 weeks gestation of pregnancy: Secondary | ICD-10-CM

## 2021-09-05 DIAGNOSIS — O9832 Other infections with a predominantly sexual mode of transmission complicating childbirth: Secondary | ICD-10-CM | POA: Diagnosis not present

## 2021-09-05 DIAGNOSIS — O9081 Anemia of the puerperium: Secondary | ICD-10-CM | POA: Diagnosis not present

## 2021-09-05 DIAGNOSIS — D62 Acute posthemorrhagic anemia: Secondary | ICD-10-CM | POA: Diagnosis not present

## 2021-09-05 DIAGNOSIS — O134 Gestational [pregnancy-induced] hypertension without significant proteinuria, complicating childbirth: Secondary | ICD-10-CM | POA: Diagnosis present

## 2021-09-05 DIAGNOSIS — Z87891 Personal history of nicotine dependence: Secondary | ICD-10-CM | POA: Diagnosis not present

## 2021-09-05 DIAGNOSIS — O99314 Alcohol use complicating childbirth: Secondary | ICD-10-CM | POA: Diagnosis not present

## 2021-09-05 DIAGNOSIS — O99214 Obesity complicating childbirth: Secondary | ICD-10-CM | POA: Diagnosis present

## 2021-09-05 DIAGNOSIS — O99210 Obesity complicating pregnancy, unspecified trimester: Secondary | ICD-10-CM | POA: Diagnosis present

## 2021-09-05 DIAGNOSIS — O135 Gestational [pregnancy-induced] hypertension without significant proteinuria, complicating the puerperium: Secondary | ICD-10-CM | POA: Diagnosis not present

## 2021-09-05 DIAGNOSIS — B009 Herpesviral infection, unspecified: Secondary | ICD-10-CM | POA: Diagnosis present

## 2021-09-05 DIAGNOSIS — O480 Post-term pregnancy: Secondary | ICD-10-CM | POA: Diagnosis not present

## 2021-09-05 DIAGNOSIS — Z98891 History of uterine scar from previous surgery: Principal | ICD-10-CM

## 2021-09-05 DIAGNOSIS — O139 Gestational [pregnancy-induced] hypertension without significant proteinuria, unspecified trimester: Secondary | ICD-10-CM | POA: Diagnosis present

## 2021-09-05 DIAGNOSIS — Z3403 Encounter for supervision of normal first pregnancy, third trimester: Secondary | ICD-10-CM

## 2021-09-05 LAB — COMPREHENSIVE METABOLIC PANEL
ALT: 14 U/L (ref 0–44)
AST: 19 U/L (ref 15–41)
Albumin: 2.9 g/dL — ABNORMAL LOW (ref 3.5–5.0)
Alkaline Phosphatase: 180 U/L — ABNORMAL HIGH (ref 38–126)
Anion gap: 13 (ref 5–15)
BUN: 7 mg/dL (ref 6–20)
CO2: 18 mmol/L — ABNORMAL LOW (ref 22–32)
Calcium: 9.1 mg/dL (ref 8.9–10.3)
Chloride: 105 mmol/L (ref 98–111)
Creatinine, Ser: 0.64 mg/dL (ref 0.44–1.00)
GFR, Estimated: >60 mL/min (ref >60)
Glucose, Bld: 98 mg/dL (ref 70–99)
Potassium: 3.6 mmol/L (ref 3.5–5.1)
Sodium: 136 mmol/L (ref 135–145)
Total Bilirubin: 0.4 mg/dL (ref 0.3–1.2)
Total Protein: 6.7 g/dL (ref 6.5–8.1)

## 2021-09-05 LAB — CBC
HCT: 38.2 % (ref 36.0–46.0)
Hemoglobin: 13.2 g/dL (ref 12.0–15.0)
MCH: 30.8 pg (ref 26.0–34.0)
MCHC: 34.6 g/dL (ref 30.0–36.0)
MCV: 89 fL (ref 80.0–100.0)
Platelets: 195 10*3/uL (ref 150–400)
RBC: 4.29 MIL/uL (ref 3.87–5.11)
RDW: 13.2 % (ref 11.5–15.5)
WBC: 18 10*3/uL — ABNORMAL HIGH (ref 4.0–10.5)
nRBC: 0 % (ref 0.0–0.2)

## 2021-09-05 LAB — PROTEIN / CREATININE RATIO, URINE
Creatinine, Urine: 35.55 mg/dL
Total Protein, Urine: <6 mg/dL

## 2021-09-05 LAB — TYPE AND SCREEN
ABO/RH(D): O POS
Antibody Screen: NEGATIVE

## 2021-09-05 LAB — RPR: RPR Ser Ql: NONREACTIVE

## 2021-09-05 MED ORDER — EPHEDRINE 5 MG/ML INJ
10.0000 mg | INTRAVENOUS | Status: DC | PRN
Start: 2021-09-05 — End: 2021-09-06

## 2021-09-05 MED ORDER — PHENYLEPHRINE 80 MCG/ML (10ML) SYRINGE FOR IV PUSH (FOR BLOOD PRESSURE SUPPORT)
80.0000 ug | PREFILLED_SYRINGE | INTRAVENOUS | Status: DC | PRN
  Filled 2021-09-05: qty 10

## 2021-09-05 MED ORDER — OXYTOCIN-SODIUM CHLORIDE 30-0.9 UT/500ML-% IV SOLN
1.0000 m[IU]/min | INTRAVENOUS | Status: DC
  Administered 2021-09-05: 2 m[IU]/min via INTRAVENOUS

## 2021-09-05 MED ORDER — OXYTOCIN-SODIUM CHLORIDE 30-0.9 UT/500ML-% IV SOLN: 2.5000 [IU]/h | INTRAVENOUS | Status: DC

## 2021-09-05 MED ORDER — MISOPROSTOL 25 MCG QUARTER TABLET
25.0000 ug | ORAL_TABLET | ORAL | Status: DC | PRN
Start: 2021-09-05 — End: 2021-09-06

## 2021-09-05 MED ORDER — TERBUTALINE SULFATE 1 MG/ML IJ SOLN
0.2500 mg | Freq: Once | INTRAMUSCULAR | Status: DC | PRN
Start: 2021-09-05 — End: 2021-09-06

## 2021-09-05 MED ORDER — OXYCODONE-ACETAMINOPHEN 5-325 MG PO TABS: 2.0000 | ORAL_TABLET | ORAL | Status: DC | PRN

## 2021-09-05 MED ORDER — LACTATED RINGERS IV SOLN
500.0000 mL | INTRAVENOUS | Status: DC | PRN
  Administered 2021-09-05 – 2021-09-06 (×3): 500 mL via INTRAVENOUS
  Administered 2021-09-06: 250 mL via INTRAVENOUS
  Administered 2021-09-06: 500 mL via INTRAVENOUS

## 2021-09-05 MED ORDER — TERBUTALINE SULFATE 1 MG/ML IJ SOLN: 0.2500 mg | Freq: Once | INTRAMUSCULAR | Status: DC | PRN

## 2021-09-05 MED ORDER — ACETAMINOPHEN 325 MG PO TABS: 650.0000 mg | ORAL_TABLET | ORAL | Status: DC | PRN

## 2021-09-05 MED ORDER — FENTANYL CITRATE (PF) 100 MCG/2ML IJ SOLN
100.0000 ug | INTRAMUSCULAR | Status: DC | PRN
  Administered 2021-09-05: 100 ug via INTRAVENOUS
  Filled 2021-09-05: qty 2

## 2021-09-05 MED ORDER — OXYCODONE-ACETAMINOPHEN 5-325 MG PO TABS: 1.0000 | ORAL_TABLET | ORAL | Status: DC | PRN

## 2021-09-05 MED ORDER — LACTATED RINGERS IV SOLN: INTRAVENOUS | Status: DC

## 2021-09-05 MED ORDER — EPHEDRINE 5 MG/ML INJ: 10.0000 mg | INTRAVENOUS | Status: DC | PRN

## 2021-09-05 MED ORDER — DIPHENHYDRAMINE HCL 50 MG/ML IJ SOLN: 12.5000 mg | INTRAMUSCULAR | Status: DC | PRN

## 2021-09-05 MED ORDER — PHENYLEPHRINE 80 MCG/ML (10ML) SYRINGE FOR IV PUSH (FOR BLOOD PRESSURE SUPPORT): 80.0000 ug | PREFILLED_SYRINGE | INTRAVENOUS | Status: DC | PRN

## 2021-09-05 MED ORDER — LIDOCAINE HCL (PF) 1 % IJ SOLN
INTRAMUSCULAR | Status: DC | PRN
  Administered 2021-09-05: 11 mL via EPIDURAL

## 2021-09-05 MED ORDER — LIDOCAINE HCL (PF) 1 % IJ SOLN: 30.0000 mL | INTRAMUSCULAR | Status: DC | PRN

## 2021-09-05 MED ORDER — ONDANSETRON HCL 4 MG/2ML IJ SOLN
4.0000 mg | Freq: Four times a day (QID) | INTRAMUSCULAR | Status: DC | PRN
  Administered 2021-09-05 – 2021-09-06 (×2): 4 mg via INTRAVENOUS
  Filled 2021-09-05 (×2): qty 2

## 2021-09-05 MED ORDER — OXYTOCIN-SODIUM CHLORIDE 30-0.9 UT/500ML-% IV SOLN
1.0000 m[IU]/min | INTRAVENOUS | Status: DC
  Filled 2021-09-05: qty 500

## 2021-09-05 MED ORDER — OXYTOCIN BOLUS FROM INFUSION: 333.0000 mL | Freq: Once | INTRAVENOUS | Status: DC

## 2021-09-05 MED ORDER — FENTANYL-BUPIVACAINE-NACL 0.5-0.125-0.9 MG/250ML-% EP SOLN
12.0000 mL/h | EPIDURAL | Status: DC | PRN
  Administered 2021-09-05 – 2021-09-06 (×3): 12 mL/h via EPIDURAL
  Filled 2021-09-05 (×3): qty 250

## 2021-09-05 MED ORDER — SOD CITRATE-CITRIC ACID 500-334 MG/5ML PO SOLN
30.0000 mL | ORAL | Status: DC | PRN
  Filled 2021-09-05: qty 30

## 2021-09-05 MED ORDER — LACTATED RINGERS IV SOLN
500.0000 mL | Freq: Once | INTRAVENOUS | Status: AC
  Administered 2021-09-05: 500 mL via INTRAVENOUS

## 2021-09-05 NOTE — Progress Notes (Signed)
Labor Progress Note Gloria Cox is a 26 y.o. G1P0 at [redacted]w[redacted]d presented for term eIOL  S:  Pt resting in bed with sister and FOB at bedside for support.  O:  BP 109/66   Pulse 76   Temp 98.5 F (36.9 C) (Oral)   Resp 17   Ht 5\' 3"  (1.6 m)   Wt 243 lb (110.2 kg)   LMP 10/12/2020   SpO2 98%   BMI 43.05 kg/m  EFM: baseline 120 bpm/ moderate variability/ 15x15 accels/ no decels  Toco/IUPC: q2-73min SVE: Dilation: 5 Effacement (%): 60 Station: -3 Presentation: Vertex Exam by:: [email or web address], RN Pitocin: to begin  A/P: 26 y.o. G1P0 [redacted]w[redacted]d  1. Labor: latent 2. FWB: cat 1 with position change 3. Pain: well-controlled with epidural  AROM completed with clear return of fluid, pitocin titration to begin. Anticipate SVD.  [redacted]w[redacted]d, CNM, MSN, IBCLC Certified Nurse Midwife, [facility]

## 2021-09-05 NOTE — Anesthesia Procedure Notes (Signed)
Epidural Patient location during procedure: OB Start time: 09/05/2021 8:51 AM End time: 09/05/2021 9:06 AM  Staffing Anesthesiologist: Lowella Curb, MD Performed: anesthesiologist   Preanesthetic Checklist Completed: patient identified, IV checked, site marked, risks and benefits discussed, surgical consent, monitors and equipment checked, pre-op evaluation and timeout performed  Epidural Patient position: sitting Prep: ChloraPrep Patient monitoring: heart rate, cardiac monitor, continuous pulse ox and blood pressure Approach: midline Location: L2-L3 Injection technique: LOR saline  Needle:  Needle type: Tuohy  Needle gauge: 17 G Needle length: 9 cm Needle insertion depth: 7 cm Catheter type: closed end flexible Catheter size: 20 Guage Catheter at skin depth: 12 cm Test dose: negative  Assessment Events: blood not aspirated, injection not painful, no injection resistance, no paresthesia and negative IV test  Additional Notes Reason for block:procedure for pain

## 2021-09-05 NOTE — H&P (Signed)
Gloria Cox is a 26 y.o. female G1P0 at [redacted]w[redacted]d is presenting for Elective Induction of labor .  She was scheduled for next week but was uncomfortable and requested earlier induction.  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   Migraine without aura and without status migrainosus, not intractable 02/14/2015   Macromastia 02/20/2014   Hyperlipemia 05/09/2013   Dysthymia 08/19/2011    OB History     Gravida  1   Para      Term      Preterm      AB      Living         SAB      IAB      Ectopic      Multiple      Live Births             Past Medical History:  Diagnosis Date   Asthma    Dyspepsia    Goiter    Hypothyroidism    [place]    Pre-diabetes    Pre-diabetes    [place]    Thyroiditis, autoimmune    Past Surgical History:  Procedure Laterality Date   CHOLECYSTECTOMY N/A 12/18/2015   Procedure: LAPAROSCOPIC CHOLECYSTECTOMY;  Surgeon: Emelia Loron, MD;  Location: [facility];  Service: General;  Laterality: N/A;   WISDOM TOOTH EXTRACTION  03/30/2012   Family History: family history includes Diabetes in her maternal grandfather, maternal grandmother, paternal grandfather, and paternal grandmother; Heart Problems (age of onset: 46) in her maternal grandfather; Hypertension in her father, maternal grandfather, maternal grandmother, paternal grandfather, and paternal grandmother; Obesity in her  brother, maternal grandmother, mother, and sister; Stroke in her father. Social History:  reports that she quit smoking about 3 years ago. Her smoking use included cigarettes. She smoked an average of .1 packs per day. She has never used smokeless tobacco. She reports that she does not currently use alcohol. She reports that she does not currently use drugs after having used the following drugs: Marijuana.     Maternal Diabetes: No Genetic Screening: Normal Maternal Ultrasounds/Referrals: [facility]F in [place], Growth 65%ile in April  Fetal Ultrasounds or other Referrals:  None Maternal Substance Abuse:  Yes:  Type: Other: Alcohol early pregnancy Significant Maternal Medications:  None Significant Maternal Lab Results:  Group B Strep negative Other Comments:  None  Review of Systems  Constitutional:  Negative for chills and fever.  Eyes:  Negative for visual disturbance.  Respiratory:  Negative for shortness of breath.   Gastrointestinal:  Positive for abdominal pain. Negative for diarrhea, nausea and vomiting.  Genitourinary:  Positive for pelvic pain and vaginal bleeding (bloody show).   Maternal Medical History:  Reason for admission: Nausea. Elective Induction of Labor  Contractions: Onset was 6-12 hours ago.   Frequency: irregular.   Perceived severity is moderate.   Fetal activity: Perceived fetal activity is normal.   Prenatal complications: Bleeding, PIH (one episode, new, today) and substance  abuse.   No preterm labor.   Prenatal Complications - Diabetes: none.     Blood pressure (!) 148/79, pulse 84, temperature 97.8 F (36.6 C), temperature source Oral, resp. rate 17, last menstrual period 10/12/2020. Vitals:   09/05/21 0622 09/05/21 0657  BP: (!) 148/79   Pulse: 84   Resp: 17   Temp: 97.8 F (36.6 C)   TempSrc: Oral   Weight:  110.2 kg  Height:  5\' 3"  (1.6 m)    Maternal Exam:  Uterine Assessment: Contraction strength is moderate.  Contraction frequency is  irregular.  Abdomen: Patient reports no abdominal tenderness. Estimated fetal weight is 7.   Fetal presentation: vertex Introitus: Normal vulva. Normal vagina.  Ferning test: not done.  Nitrazine test: not done. Pelvis: adequate for delivery.   Cervix: Cervix evaluated by digital exam.     Fetal Exam Fetal Monitor Review: Mode: ultrasound.   Baseline rate: 120.  Variability: moderate (6-25 bpm).   Pattern: accelerations present and no decelerations.   Fetal State Assessment: Category I - tracings are normal.   Physical Exam Constitutional:      General: She is not in acute distress.    Appearance: She is not ill-appearing or toxic-appearing.  HENT:     Head: Normocephalic.  Cardiovascular:     Rate and Rhythm: Normal rate.  Pulmonary:     Effort: Pulmonary effort is normal.  Abdominal:     General: There is no distension.     Tenderness: There is no abdominal tenderness. There is no guarding.  Genitourinary:    General: Normal vulva.     Comments: Mucous discharge, small amount bloody show No watery discharge  Dilation: 4 Effacement (%): 70 Station: -3 Presentation: Vertex Exam by:: [email or web address], CNM  Foley in Vagina on admission, removed by RN Musculoskeletal:        General: Normal range of motion.     Cervical back: Normal range of motion.  Skin:    General: Skin is warm and dry.  Neurological:     General: No focal deficit present.     Mental Status: She is alert.  Psychiatric:        Mood and Affect: Mood normal.     Prenatal labs: ABO, Rh: O/Positive/-- (12/14 1654) Antibody: Negative (12/14 1654) Rubella: 4.50 (12/14 1654) RPR: Non Reactive (03/09 1019)  HBsAg: Negative (12/14 1654)  HIV: Non Reactive (03/09 1019)  GBS: Negative/-- (05/11 1414)   Assessment/Plan: Single IUP at [redacted]w[redacted]d Elective Induction of Labor Status post Foley Bulb ripening  Admit to Labor and Delivery Routine orders Will watch expectantly for now since patient is  contracting frequently Will add Pitocin as indicated   [redacted]w[redacted]d 09/05/2021, 6:31 AM

## 2021-09-05 NOTE — Anesthesia Preprocedure Evaluation (Signed)
Anesthesia Evaluation  Patient identified by MRN, date of birth, ID band Patient awake    Reviewed: Allergy & Precautions, NPO status , Patient's Chart, lab work & pertinent test results  Airway Mallampati: II  TM Distance: >3 FB Neck ROM: Full    Dental no notable dental hx.    Pulmonary asthma , former smoker,    Pulmonary exam normal breath sounds clear to auscultation       Cardiovascular negative cardio ROS Normal cardiovascular exam Rhythm:Regular Rate:Normal     Neuro/Psych  Headaches, Depression negative psych ROS   GI/Hepatic negative GI ROS, Neg liver ROS,   Endo/Other  Hypothyroidism   Renal/GU negative Renal ROS  negative genitourinary   Musculoskeletal negative musculoskeletal ROS (+)   Abdominal (+) + obese,   Peds negative pediatric ROS (+)  Hematology negative hematology ROS (+)   Anesthesia Other Findings   Reproductive/Obstetrics negative OB ROS                             Anesthesia Physical Anesthesia Plan  ASA: 2  Anesthesia Plan: Epidural   Post-op Pain Management:    Induction:   PONV Risk Score and Plan:   Airway Management Planned:   Additional Equipment:   Intra-op Plan:   Post-operative Plan:   Informed Consent:   Plan Discussed with:   Anesthesia Plan Comments:         Anesthesia Quick Evaluation

## 2021-09-05 NOTE — Progress Notes (Signed)
Labor Progress Note Gloria Cox is a 26 y.o. G1P0 at [redacted]w[redacted]d presented for term eIOL  S:  Pt resting in bed, got epidural ago. Fetal baseline down to 100s-110s. Thinks water may have broken.  O:  BP (!) 120/54   Pulse 72   Temp 97.8 F (36.6 C) (Oral)   Resp 17   Ht 5\' 3"  (1.6 m)   Wt 243 lb (110.2 kg)   LMP 10/12/2020   SpO2 99%   BMI 43.05 kg/m  EFM: baseline 110 bpm/ minimal to moderate variability/ improved to 15x15 accels/ no decels  Toco/IUPC: occasional SVE: Dilation: 4.5 Effacement (%): 60 Station: -3 Presentation: Vertex Exam by:: Lexie Ament, RN Pitocin: none  A/P: 26 y.o. G1P0 [redacted]w[redacted]d  1. Labor: latent, will monitor contractions with possible AROM 2. FWB: cat 1 with position change 3. Pain: well-controlled with epidural  Pt was reclined post-epidural, repositioned to side-lying and baseline/reactivity improved. Anticipate SVD.  [redacted]w[redacted]d, CNM 10:55 AM

## 2021-09-06 ENCOUNTER — Encounter (HOSPITAL_COMMUNITY): Payer: Self-pay | Admitting: Obstetrics and Gynecology

## 2021-09-06 ENCOUNTER — Encounter (HOSPITAL_COMMUNITY)
Admission: AD | Disposition: A | Discharge status: Home / Self Care | Payer: Self-pay | Source: Home / Self Care | Attending: Obstetrics and Gynecology

## 2021-09-06 ENCOUNTER — Other Ambulatory Visit: Payer: Self-pay

## 2021-09-06 DIAGNOSIS — O139 Gestational [pregnancy-induced] hypertension without significant proteinuria, unspecified trimester: Secondary | ICD-10-CM | POA: Diagnosis present

## 2021-09-06 DIAGNOSIS — O480 Post-term pregnancy: Secondary | ICD-10-CM | POA: Diagnosis not present

## 2021-09-06 DIAGNOSIS — O9832 Other infections with a predominantly sexual mode of transmission complicating childbirth: Secondary | ICD-10-CM

## 2021-09-06 DIAGNOSIS — O135 Gestational [pregnancy-induced] hypertension without significant proteinuria, complicating the puerperium: Secondary | ICD-10-CM

## 2021-09-06 DIAGNOSIS — Z98891 History of uterine scar from previous surgery: Secondary | ICD-10-CM

## 2021-09-06 DIAGNOSIS — Z3A40 40 weeks gestation of pregnancy: Secondary | ICD-10-CM

## 2021-09-06 DIAGNOSIS — O99314 Alcohol use complicating childbirth: Secondary | ICD-10-CM | POA: Diagnosis not present

## 2021-09-06 HISTORY — DX: History of uterine scar from previous surgery: Z98.891

## 2021-09-06 LAB — ABO/RH: ABO/RH(D): O POS

## 2021-09-06 SURGERY — Surgical Case
Anesthesia: Epidural

## 2021-09-06 MED ORDER — PROMETHAZINE HCL 25 MG/ML IJ SOLN: 6.2500 mg | INTRAMUSCULAR | Status: DC | PRN

## 2021-09-06 MED ORDER — DIPHENHYDRAMINE HCL 25 MG PO CAPS: 25.0000 mg | ORAL_CAPSULE | ORAL | Status: DC | PRN

## 2021-09-06 MED ORDER — LIDOCAINE-EPINEPHRINE (PF) 2 %-1:200000 IJ SOLN
INTRAMUSCULAR | Status: AC
  Filled 2021-09-06: qty 20

## 2021-09-06 MED ORDER — ACETAMINOPHEN 500 MG PO TABS
1000.0000 mg | ORAL_TABLET | Freq: Four times a day (QID) | ORAL | Status: DC
  Administered 2021-09-06 – 2021-09-09 (×11): 1000 mg via ORAL
  Filled 2021-09-06 (×11): qty 2

## 2021-09-06 MED ORDER — FENTANYL CITRATE (PF) 100 MCG/2ML IJ SOLN
INTRAMUSCULAR | Status: AC
  Filled 2021-09-06: qty 2

## 2021-09-06 MED ORDER — LACTATED RINGERS IV SOLN: INTRAVENOUS | Status: DC

## 2021-09-06 MED ORDER — BUPIVACAINE HCL (PF) 0.25 % IJ SOLN
INTRAMUSCULAR | Status: DC | PRN
  Administered 2021-09-06: 8 mL via EPIDURAL
  Administered 2021-09-06: 10 mL via EPIDURAL

## 2021-09-06 MED ORDER — FENTANYL CITRATE (PF) 100 MCG/2ML IJ SOLN
INTRAMUSCULAR | Status: DC | PRN
  Administered 2021-09-06: 100 ug via EPIDURAL

## 2021-09-06 MED ORDER — IBUPROFEN 600 MG PO TABS
600.0000 mg | ORAL_TABLET | Freq: Four times a day (QID) | ORAL | Status: DC
  Administered 2021-09-07 – 2021-09-09 (×8): 600 mg via ORAL
  Filled 2021-09-06 (×8): qty 1

## 2021-09-06 MED ORDER — SIMETHICONE 80 MG PO CHEW
80.0000 mg | CHEWABLE_TABLET | Freq: Three times a day (TID) | ORAL | Status: DC
  Administered 2021-09-06 – 2021-09-09 (×9): 80 mg via ORAL
  Filled 2021-09-06 (×9): qty 1

## 2021-09-06 MED ORDER — DIBUCAINE (PERIANAL) 1 % EX OINT: 1.0000 "application " | TOPICAL_OINTMENT | CUTANEOUS | Status: DC | PRN

## 2021-09-06 MED ORDER — PHENYLEPHRINE 80 MCG/ML (10ML) SYRINGE FOR IV PUSH (FOR BLOOD PRESSURE SUPPORT)
PREFILLED_SYRINGE | INTRAVENOUS | Status: AC
  Filled 2021-09-06: qty 10

## 2021-09-06 MED ORDER — MENTHOL 3 MG MT LOZG: 1.0000 | LOZENGE | OROMUCOSAL | Status: DC | PRN

## 2021-09-06 MED ORDER — ONDANSETRON HCL 4 MG/2ML IJ SOLN
INTRAMUSCULAR | Status: AC
  Filled 2021-09-06: qty 2

## 2021-09-06 MED ORDER — SODIUM CHLORIDE 0.9 % IV SOLN
INTRAVENOUS | Status: AC
  Filled 2021-09-06: qty 5

## 2021-09-06 MED ORDER — SODIUM BICARBONATE 8.4 % IV SOLN
INTRAVENOUS | Status: DC | PRN
  Administered 2021-09-06 (×2): 5 mL via EPIDURAL
  Administered 2021-09-06: 2 mL via EPIDURAL

## 2021-09-06 MED ORDER — MEPERIDINE HCL 25 MG/ML IJ SOLN: 6.2500 mg | INTRAMUSCULAR | Status: DC | PRN

## 2021-09-06 MED ORDER — SODIUM CHLORIDE 0.9% FLUSH: 3.0000 mL | INTRAVENOUS | Status: DC | PRN

## 2021-09-06 MED ORDER — SENNOSIDES-DOCUSATE SODIUM 8.6-50 MG PO TABS
2.0000 | ORAL_TABLET | Freq: Every day | ORAL | Status: DC
  Administered 2021-09-07 – 2021-09-09 (×3): 2 via ORAL
  Filled 2021-09-06 (×3): qty 2

## 2021-09-06 MED ORDER — OXYTOCIN-SODIUM CHLORIDE 30-0.9 UT/500ML-% IV SOLN
INTRAVENOUS | Status: DC | PRN
  Administered 2021-09-06: 350 mL via INTRAVENOUS

## 2021-09-06 MED ORDER — ONDANSETRON HCL 4 MG/2ML IJ SOLN
INTRAMUSCULAR | Status: DC | PRN
  Administered 2021-09-06: 4 mg via INTRAVENOUS

## 2021-09-06 MED ORDER — HYDROMORPHONE HCL 1 MG/ML IJ SOLN: 0.2500 mg | INTRAMUSCULAR | Status: DC | PRN

## 2021-09-06 MED ORDER — OXYCODONE HCL 5 MG/5ML PO SOLN: 5.0000 mg | Freq: Once | ORAL | Status: DC | PRN

## 2021-09-06 MED ORDER — CEFAZOLIN SODIUM-DEXTROSE 2-4 GM/100ML-% IV SOLN
INTRAVENOUS | Status: AC
  Filled 2021-09-06: qty 100

## 2021-09-06 MED ORDER — NALOXONE HCL 0.4 MG/ML IJ SOLN: 0.4000 mg | INTRAMUSCULAR | Status: DC | PRN

## 2021-09-06 MED ORDER — ENOXAPARIN SODIUM 60 MG/0.6ML IJ SOSY
60.0000 mg | PREFILLED_SYRINGE | INTRAMUSCULAR | Status: DC
  Administered 2021-09-07 – 2021-09-09 (×3): 60 mg via SUBCUTANEOUS
  Filled 2021-09-06 (×3): qty 0.6

## 2021-09-06 MED ORDER — KETOROLAC TROMETHAMINE 30 MG/ML IJ SOLN
30.0000 mg | Freq: Once | INTRAMUSCULAR | Status: AC | PRN
  Administered 2021-09-06: 30 mg via INTRAVENOUS

## 2021-09-06 MED ORDER — KETOROLAC TROMETHAMINE 30 MG/ML IJ SOLN
30.0000 mg | Freq: Four times a day (QID) | INTRAMUSCULAR | Status: AC
  Administered 2021-09-06 – 2021-09-07 (×2): 30 mg via INTRAVENOUS
  Filled 2021-09-06 (×2): qty 1

## 2021-09-06 MED ORDER — CEFAZOLIN SODIUM-DEXTROSE 2-4 GM/100ML-% IV SOLN
2.0000 g | INTRAVENOUS | Status: AC
  Administered 2021-09-06: 2 g via INTRAVENOUS

## 2021-09-06 MED ORDER — LACTATED RINGERS IV SOLN: INTRAVENOUS | Status: DC | PRN

## 2021-09-06 MED ORDER — DIPHENHYDRAMINE HCL 25 MG PO CAPS: 25.0000 mg | ORAL_CAPSULE | Freq: Four times a day (QID) | ORAL | Status: DC | PRN

## 2021-09-06 MED ORDER — MORPHINE SULFATE (PF) 0.5 MG/ML IJ SOLN
INTRAMUSCULAR | Status: AC
  Filled 2021-09-06: qty 10

## 2021-09-06 MED ORDER — OXYCODONE HCL 5 MG PO TABS
5.0000 mg | ORAL_TABLET | ORAL | Status: DC | PRN
  Administered 2021-09-07 – 2021-09-09 (×6): 10 mg via ORAL
  Filled 2021-09-06 (×6): qty 2

## 2021-09-06 MED ORDER — KETOROLAC TROMETHAMINE 30 MG/ML IJ SOLN
INTRAMUSCULAR | Status: AC
  Filled 2021-09-06: qty 1

## 2021-09-06 MED ORDER — COCONUT OIL OIL
1.0000 | TOPICAL_OIL | Status: DC | PRN
Start: 2021-09-06 — End: 2021-09-09

## 2021-09-06 MED ORDER — DIPHENHYDRAMINE HCL 50 MG/ML IJ SOLN: 12.5000 mg | INTRAMUSCULAR | Status: DC | PRN

## 2021-09-06 MED ORDER — SODIUM CHLORIDE 0.9 % IV SOLN
500.0000 mg | INTRAVENOUS | Status: AC
  Administered 2021-09-06: 500 mg via INTRAVENOUS

## 2021-09-06 MED ORDER — MORPHINE SULFATE (PF) 0.5 MG/ML IJ SOLN
INTRAMUSCULAR | Status: DC | PRN
  Administered 2021-09-06: 3 mg via EPIDURAL

## 2021-09-06 MED ORDER — PHENYLEPHRINE HCL-NACL 20-0.9 MG/250ML-% IV SOLN
INTRAVENOUS | Status: DC | PRN
  Administered 2021-09-06: 80 ug via INTRAVENOUS
  Administered 2021-09-06: 40 ug via INTRAVENOUS

## 2021-09-06 MED ORDER — TRANEXAMIC ACID-NACL 1000-0.7 MG/100ML-% IV SOLN
1000.0000 mg | INTRAVENOUS | Status: AC
Start: 2021-09-06 — End: 2021-09-06
  Administered 2021-09-06: 1000 mg via INTRAVENOUS

## 2021-09-06 MED ORDER — SOD CITRATE-CITRIC ACID 500-334 MG/5ML PO SOLN
30.0000 mL | ORAL | Status: AC
  Administered 2021-09-06: 30 mL via ORAL

## 2021-09-06 MED ORDER — WITCH HAZEL-GLYCERIN EX PADS: 1.0000 "application " | MEDICATED_PAD | CUTANEOUS | Status: DC | PRN

## 2021-09-06 MED ORDER — SIMETHICONE 80 MG PO CHEW
80.0000 mg | CHEWABLE_TABLET | ORAL | Status: DC | PRN
  Administered 2021-09-07: 80 mg via ORAL
  Filled 2021-09-06 (×2): qty 1

## 2021-09-06 MED ORDER — PRENATAL MULTIVITAMIN CH
1.0000 | ORAL_TABLET | Freq: Every day | ORAL | Status: DC
  Administered 2021-09-07 – 2021-09-09 (×3): 1 via ORAL
  Filled 2021-09-06 (×3): qty 1

## 2021-09-06 MED ORDER — OXYTOCIN-SODIUM CHLORIDE 30-0.9 UT/500ML-% IV SOLN
INTRAVENOUS | Status: AC
  Filled 2021-09-06: qty 500

## 2021-09-06 MED ORDER — OXYCODONE HCL 5 MG PO TABS: 5.0000 mg | ORAL_TABLET | Freq: Once | ORAL | Status: DC | PRN

## 2021-09-06 MED ORDER — NALOXONE HCL 4 MG/10ML IJ SOLN: 1.0000 ug/kg/h | INTRAVENOUS | Status: DC | PRN

## 2021-09-06 MED ORDER — OXYTOCIN-SODIUM CHLORIDE 30-0.9 UT/500ML-% IV SOLN
2.5000 [IU]/h | INTRAVENOUS | Status: AC
  Administered 2021-09-06: 2.5 [IU]/h via INTRAVENOUS
  Filled 2021-09-06: qty 500

## 2021-09-06 SURGICAL SUPPLY — 34 items
APL SKNCLS STERI-STRIP NONHPOA (GAUZE/BANDAGES/DRESSINGS) ×1
BENZOIN TINCTURE PRP APPL 2/3 (GAUZE/BANDAGES/DRESSINGS) ×2 IMPLANT
CANISTER SUCT 3000ML PPV (MISCELLANEOUS) ×2 IMPLANT
CHLORAPREP W/TINT 26ML (MISCELLANEOUS) ×4 IMPLANT
CLAMP UMBILICAL CORD (MISCELLANEOUS) ×2 IMPLANT
DRSG OPSITE POSTOP 4X10 (GAUZE/BANDAGES/DRESSINGS) ×2 IMPLANT
ELECT REM PT RETURN 9FT ADLT (ELECTROSURGICAL) ×2
ELECTRODE REM PT RTRN 9FT ADLT (ELECTROSURGICAL) ×1 IMPLANT
EXTRACTOR VACUUM KIWI (MISCELLANEOUS) ×2 IMPLANT
GLOVE BIOGEL PI IND STRL 7.0 (GLOVE) ×2 IMPLANT
GLOVE BIOGEL PI IND STRL 7.5 (GLOVE) ×1 IMPLANT
GLOVE BIOGEL PI INDICATOR 7.0 (GLOVE) ×2
GLOVE BIOGEL PI INDICATOR 7.5 (GLOVE) ×1
GLOVE SKINSENSE NS SZ7.0 (GLOVE) ×1
GLOVE SKINSENSE STRL SZ7.0 (GLOVE) ×1 IMPLANT
GOWN STRL REUS W/ TWL LRG LVL3 (GOWN DISPOSABLE) ×2 IMPLANT
GOWN STRL REUS W/ TWL XL LVL3 (GOWN DISPOSABLE) ×1 IMPLANT
GOWN STRL REUS W/TWL LRG LVL3 (GOWN DISPOSABLE) ×4
GOWN STRL REUS W/TWL XL LVL3 (GOWN DISPOSABLE) ×2
NS IRRIG 1000ML POUR BTL (IV SOLUTION) ×2 IMPLANT
PACK C SECTION WH (CUSTOM PROCEDURE TRAY) ×2 IMPLANT
PAD ABD 7.5X8 STRL (GAUZE/BANDAGES/DRESSINGS) ×2 IMPLANT
PAD OB MATERNITY 4.3X12.25 (PERSONAL CARE ITEMS) ×2 IMPLANT
PAD PREP 24X48 CUFFED NSTRL (MISCELLANEOUS) ×2 IMPLANT
STRIP CLOSURE SKIN 1/2X4 (GAUZE/BANDAGES/DRESSINGS) ×2 IMPLANT
SUT MNCRL 0 VIOLET CTX 36 (SUTURE) ×2 IMPLANT
SUT MON AB 4-0 PS1 27 (SUTURE) ×3 IMPLANT
SUT MONOCRYL 0 CTX 36 (SUTURE) ×4
SUT PLAIN 2 0 XLH (SUTURE) ×3 IMPLANT
SUT VIC AB 0 CT1 36 (SUTURE) ×4 IMPLANT
SUT VIC AB 3-0 CT1 27 (SUTURE) ×2
SUT VIC AB 3-0 CT1 TAPERPNT 27 (SUTURE) ×1 IMPLANT
TOWEL OR 17X24 6PK STRL BLUE (TOWEL DISPOSABLE) ×4 IMPLANT
WATER STERILE IRR 1000ML POUR (IV SOLUTION) ×2 IMPLANT

## 2021-09-06 NOTE — Anesthesia Postprocedure Evaluation (Signed)
Anesthesia Post Note  Patient: Gloria Cox  Procedure(s) Performed: CESAREAN SECTION     Patient location during evaluation: PACU Anesthesia Type: Epidural Level of consciousness: awake and alert Pain management: pain level controlled Vital Signs Assessment: post-procedure vital signs reviewed and stable Respiratory status: spontaneous breathing, nonlabored ventilation and respiratory function stable Cardiovascular status: blood pressure returned to baseline and stable Postop Assessment: no apparent nausea or vomiting Anesthetic complications: no   No notable events documented.  Last Vitals:  Vitals:   09/06/21 1830 09/06/21 1842  BP: 115/68 121/68  Pulse: 79 77  Resp: 12 15  Temp:  36.7 C  SpO2: 97% 98%    Last Pain:  Vitals:   09/06/21 1842  TempSrc: Oral  PainSc: 0-No pain   Pain Goal:                   Lowella Curb

## 2021-09-06 NOTE — Progress Notes (Signed)
Labor Progress Note Gloria Cox is a 26 y.o. G1P0 at [redacted]w[redacted]d presented for IOL (elective at term) S:  Checked on patient. Doing well. Feels comfortable with epidural O:  BP (!) 130/56   Pulse 90   Temp 97.8 F (36.6 C) (Oral)   Resp 16   Ht 5\' 3"  (1.6 m)   Wt 110.2 kg   LMP 10/12/2020   SpO2 100%   BMI 43.05 kg/m  EFM: 120/moderate variability/+accels/no decels  CVE: Dilation: 5 Effacement (%): 70 Station: -3 Presentation: Vertex Exam by:: Renard Matter, MD   A&P: 26 y.o. G1P0 at [redacted]w[redacted]d presented for IOL (elective at term) #Labor: S/p FB, cytotec, and AROM. Started on pitocin ~1400. IUPC in place and contractions adequate (MVUS 190s-low 200s) for last ~2 hours. Will reassess cervix once adequate contractions for 3-4 hours. Will continue uptitrating pitocin as needed if contractions space out. Can check cervix sooner if clinically warranted #Pain: epidural #FWB: Cat I  #GBS negative   Renard Matter, MD 12:19 AM

## 2021-09-06 NOTE — Progress Notes (Signed)
Labor Progress Note Gloria Cox is a 26 y.o. G1P0 at [redacted]w[redacted]d who presented for elective IOL.  S: Doing well. Pain much improved from prior after re-dose of epidural. No concerns.   O:  BP 123/74   Pulse 85   Temp 98.9 F (37.2 C) (Axillary)   Resp 18   Ht 5\' 3"  (1.6 m)   Wt 110.2 kg   LMP 10/12/2020   SpO2 99%   BMI 43.05 kg/m   EFM: Baseline 125 bpm, moderate variability, + accels, no decels  Toco: Occasional contractions   CVE: Dilation: 4.5 Effacement (%): 80 Station: -2 Presentation: Vertex Exam by:: Amany Rando, MD  A&P: 26 y.o. G1P0 [redacted]w[redacted]d   #Labor: SVE similar to prior s/p Pitocin break. Will plan to restart Pitocin at this time and monitor closely. Discussed with patient that if she remains unchanged after having adequate contractions or if baby does not tolerate contractions, may need to proceed with cesarean delivery. Patient voiced understanding. All questions and concerns addressed. Attending, Dr. Ilda Basset, updated with plan of care. #Pain: Epidural  #FWB: Cat 1  #GBS negative  Genia Del, MD 9:34 AM

## 2021-09-06 NOTE — Op Note (Addendum)
Gloria Cox  PROCEDURE DATE: 09/06/2021  PREOPERATIVE DIAGNOSES: Intrauterine pregnancy at [redacted]w[redacted]d weeks gestation; failure to progress: arrest of dilation 4-5cm  POSTOPERATIVE DIAGNOSES: The same. Asynclitic fetal head.   PROCEDURE: Primary Low Transverse Cesarean Section  SURGEON:  Dr. Benton Bing  ASSISTANT:  Dr. Evalina Field   An experienced assistant was required given the standard of surgical care given the complexity of the case.  This assistant was needed for exposure, dissection, suctioning, retraction, instrument exchange, assisting with delivery with administration of fundal pressure, and for overall help during the procedure.  ANESTHESIOLOGY TEAM: Anesthesiologist: Beryle Lathe, MD; Lowella Curb, MD  INDICATIONS: Gloria Cox is a 26 y.o. G1P1001 at [redacted]w[redacted]d here for cesarean section secondary to the indications listed under preoperative diagnoses; please see preoperative note for further details.  The risks of cesarean section were discussed with the patient including but were not limited to: bleeding which may require transfusion or reoperation; infection which may require antibiotics; injury to bowel, bladder, ureters or other surrounding organs; injury to the fetus; need for additional procedures including hysterectomy in the event of a life-threatening hemorrhage; placental abnormalities wth subsequent pregnancies, incisional problems, thromboembolic phenomenon and other postoperative/anesthesia complications.   The patient concurred with the proposed plan, giving informed written consent for the procedure.    FINDINGS:  Viable female infant in cephalic presentation, direct occiput posterior position.  Apgars 6 and 8.  Meconium stained amniotic fluid.  Intact placenta, three vessel cord.  Normal uterus, fallopian tubes and ovaries bilaterally. Weight 3400gm  ANESTHESIA: Epidural  INTRAVENOUS FLUIDS: 1500 ml   ESTIMATED BLOOD LOSS: 735 ml URINE OUTPUT:   200 ml SPECIMENS: Placenta sent to L&D COMPLICATIONS: None immediate  PROCEDURE IN DETAIL:   The patient preoperatively received intravenous antibiotics and had sequential compression devices applied to her lower extremities.  She was then taken to the operating room where the epidural anesthesia was dosed up to surgical level and was found to be adequate. She was then placed in a dorsal supine position with a leftward tilt, and prepped and draped in a sterile manner.  A foley catheter was in place and attached to constant gravity.    After an adequate timeout was performed, a Pfannenstiel skin incision was made with a scalpel and carried through to the underlying layer of fascia. The fascia was incised in the midline, and this incision was extended bilaterally bluntly. The rectus muscles were separated in the midline and the peritoneum was entered bluntly. The Alexis self-retaining retractor was introduced into the abdominal cavity. A bladder flap was made.   Attention was turned to the lower uterine segment where a low transverse hysterotomy was made with a scalpel and extended bilaterally bluntly. The infant was successfully delivered, the cord was clamped and cut after one minute, and the infant was handed over to the awaiting neonatology team. Uterine massage was then administered, and the placenta delivered intact with a three-vessel cord. The uterus was then cleared of clots and debris.    The hysterotomy was closed with 0 Monocryl in a running locked fashion.  The pelvis was cleared of all clot and debris.  Hemostasis was confirmed on all surfaces.  The retractor was removed.    The peritoneum was re-approximated with a single 3-0 Vicryl interrupted stitch. The fascia was then closed using 0 Vicryl in a running fashion.  The subcutaneous layer was irrigated, re-approximated with 2-0 plain gut, and the skin was closed with a 4-0 Vicryl subcuticular  stitch. The patient tolerated the procedure  well.  Sponge, instrument and needle counts were correct x 3.  She was taken to the recovery room in stable condition.   Evalina Field, MD  [facility] Fellow  Faculty Practice    Agree with above. I was present and scrubbed for the entire procedure.   Cornelia Copa MD Attending Center for [employer] [facility]wife)

## 2021-09-06 NOTE — Lactation Note (Signed)
This note was copied from a baby's chart. Lactation Consultation Note  Patient Name: Gloria Cox [id number] Date: 09/06/2021 Reason for consult: Initial assessment;Mother's request;Difficult latch;Primapara;1st time breastfeeding;Term;Breastfeeding assistance;Maternal endocrine disorder Age:24 hours  LC assisted with latching infant on the right breast. Some areola edema on left, making it difficult to latch. Mom wearing breast shells not pumping sleeping or nursing.  Manual pump provided sized with 24 flange post pump after feeding for 10 min on left breast and offer colostrum on spoon or via bottle with pace bottle feeding and slow flow nipple.  Mom aware to feed based on cues 8-12x 24hr period.  All questions answered at the end of the visit.  Mom does not have a pump at home. Mom in process of completing her [facility] registration, number provided for her to call the office on Monday.   Maternal Data Has patient been taught Hand Expression?: Yes Does the patient have breastfeeding experience prior to this delivery?: No  Feeding Mother's Current Feeding Choice: Breast Milk  LATCH Score Latch: Repeated attempts needed to sustain latch, nipple held in mouth throughout feeding, stimulation needed to elicit sucking reflex.  Audible Swallowing: Spontaneous and intermittent  Type of Nipple: Everted at rest and after stimulation  Comfort (Breast/Nipple): Soft / non-tender  Hold (Positioning): Assistance needed to correctly position infant at breast and maintain latch.  LATCH Score: 8   Lactation Tools Discussed/Used Tools: Flanges;Pump;Shells Flange Size: 24 Breast pump type: Manual Pump Education: Setup, frequency, and cleaning;Milk Storage Reason for Pumping: pump on left side (Mom areola edema on left side, infant not able to latch on that side. Mom using breast shells and pumping after feeding with manual pump) Pumping frequency: post pump after feeding for 10  min  Interventions Interventions: Breast feeding basics reviewed;Assisted with latch;Skin to skin;Breast massage;Hand express;Breast compression;Adjust position;Support pillows;Position options;Expressed milk;DEBP;Education;Pace feeding;Infant Driven Feeding Algorithm education  Discharge Pump: Manual WIC Program: No  Consult Status Consult Status: Follow-up Date: 09/07/21 Follow-up type: In-patient    Neal Oshea  Nicholson-Springer 09/06/2021, 10:28 PM

## 2021-09-06 NOTE — Progress Notes (Signed)
Labor Progress Note Gloria Cox is a 26 y.o. G1P0 at [redacted]w[redacted]d who presented for eIOL.  S: Doing well. Feeling lots of pressure/back pain. No other concerns at this time. Family at bedside.   O:  BP (!) 145/93   Pulse 91   Temp 98.4 F (36.9 C) (Oral)   Resp 18   Ht 5\' 3"  (1.6 m)   Wt 110.2 kg   LMP 10/12/2020   SpO2 100%   BMI 43.05 kg/m   EFM: Baseline 120 bpm, moderate variability, + accels, no decels  Toco: Every 2-3 minutes, MVUs intermittently adequate   CVE: Dilation: 4.5 Effacement (%): 80 Station: -1 Presentation: Vertex Exam by:: [email or web address], MD  A&P: 26 y.o. G1P0 [redacted]w[redacted]d   #Labor: SVE unchanged from prior. Asynclitic fetal head. MVUs intermittently adequate since 1 PM. Has been on Pitocin for over 24 hours with the exception of Pitocin break earlier this AM. ROM for over 24 hours. Discussed with attending Dr. [redacted]w[redacted]d. Recommended primary cesarean section for arrest of dilation. Patient was agreeable to proceed at this time. Pitocin discontinued.   The risks of surgery were discussed with the patient including but were not limited to: bleeding which may require transfusion or reoperation; infection which may require antibiotics; injury to bowel, bladder, ureters or other surrounding organs; injury to the fetus; need for additional procedures including hysterectomy in the event of a life-threatening hemorrhage; formation of adhesions; placental abnormalities with subsequent pregnancies; incisional problems; thromboembolic phenomenon and other postoperative/anesthesia complications.  The patient concurred with the proposed plan, giving informed written consent for the procedure.  Anesthesia and OR aware. Preoperative prophylactic antibiotics and SCDs ordered on call to the OR.  To OR when ready.  #Pain: Epidural  #FWB: Cat 1  #GBS negative  Ancef and Azithromycin ordered for surgical prophylaxis in addition to TXA.   Vergie Living, MD 3:44 PM

## 2021-09-06 NOTE — Lactation Note (Signed)
This note was copied from a baby's chart. Lactation Consultation Note  Patient Name: Gloria Cox [id number] Date: 09/06/2021   Age:24 hours  LC went in to assist with breastfeeding. Mom declined [place] services at this time. She will call for latch assistance and breastfeeding support when ready.  Infant last feeding according to Mother at [phone] for 30 mins.    Maternal Data    Feeding    LATCH Score Latch: Repeated attempts needed to sustain latch, nipple held in mouth throughout feeding, stimulation needed to elicit sucking reflex.  Audible Swallowing: A few with stimulation  Type of Nipple: Everted at rest and after stimulation  Comfort (Breast/Nipple): Soft / non-tender  Hold (Positioning): Assistance needed to correctly position infant at breast and maintain latch.  LATCH Score: 7   Lactation Tools Discussed/Used    Interventions    Discharge    Consult Status      Gloria Dross  Cox 09/06/2021, 8:51 PM

## 2021-09-06 NOTE — Progress Notes (Signed)
Checked in on patient.   Feeling pressure and some shakes. Also contractions mostly adequate for ~3.4 hours now   Cervix checked. 4-5cm (likely unchanged from last exam in dilation but this was my first exam of her cervix), effacement 70%, and head still at -3 station. Also suspect head is currently in OP position based on fontanelle placement.  A/P: Cervix largely unchanged in dilation. Suspect effacemnt is further progressed as felt thinner than 60% although with different examiners possibly not much change from last check. Discussed uptitration of pitocin as at times contractions spaced out and less than adequate. Also discussed position changes to facilitate fetal head descent and hopefully rotation to OA position. Will hold off on recheck for ~4 hours unless clinically warranted and will plan for frequent repositioning during that time.   Warner Mccreedy, MD, MPH OB Fellow, Faculty Practice

## 2021-09-06 NOTE — Transfer of Care (Signed)
Immediate Anesthesia Transfer of Care Note  Patient: Gloria Cox  Procedure(s) Performed: CESAREAN SECTION  Patient Location: PACU  Anesthesia Type:Epidural  Level of Consciousness: awake, alert  and oriented  Airway & Oxygen Therapy: Patient Spontanous Breathing  Post-op Assessment: Report given to RN and Post -op Vital signs reviewed and stable  Post vital signs: Reviewed and stable  Last Vitals:  Vitals Value Taken Time  BP 132/81 09/06/21 1731  Temp    Pulse 88 09/06/21 1734  Resp 21 09/06/21 1734  SpO2 100 % 09/06/21 1734  Vitals shown include unvalidated device data.  Last Pain:  Vitals:   09/06/21 1501  TempSrc:   PainSc: 0-No pain         Complications: No notable events documented.

## 2021-09-06 NOTE — Discharge Summary (Signed)
Postpartum Discharge Summary     Patient Name: Gloria Cox DOB: [birth year] MRN: [id number]  Date of admission: 09/05/2021 Delivery date:09/06/2021  Delivering provider: Aletha Halim  Date of discharge: 09/09/2021  Admitting diagnosis: Post term pregnancy over 40 weeks [O48.0] Intrauterine pregnancy: [redacted]w[redacted]d    Secondary diagnosis:  Principal Problem:   S/P cesarean section Active Problems:   HSV-1 (herpes simplex virus 1) infection   Encounter for supervision of normal first pregnancy in third trimester   Obesity in pregnancy   Post term pregnancy over [redacted] weeks   Gestational hypertension  Additional problems: None    Discharge diagnosis: Term Pregnancy Delivered                                              Post partum procedures:  None Augmentation: AROM, Pitocin, and OP Foley Complications: None  Hospital course: Induction of Labor With Cesarean Section   26y.o. yo G1P1001 at 422w3das admitted to the hospital 09/05/2021 for elective induction of labor.  Patient had an outpatient foley balloon placed, followed by AROM and Pitocin.  Patient remained unchanged at 4.5 cm despite being on Pitocin for over 24 hours.  The patient went for cesarean section due to Arrest of Dilation.  Delivery details are as follows: Membrane Rupture Time/Date: 8:36 AM ,09/05/2021   Delivery Method:C-Section, Low Transverse  Details of operation can be found in separate operative note.  Patient had an uncomplicated postpartum course.  Her hemoglobin on POD#1 was 9.6, for which she received PO Iron.  She is ambulating, tolerating a regular diet, passing flatus, and urinating well.  Her pain and bleeding are controlled.  She is breast feeding well.  Patient is discharged home in stable condition on 09/09/21.      Newborn Data: Birth date:09/06/2021  Birth time:4:44 PM  Gender:Female  Living status:Living  Apgars:6 ,8  Weight:3400 g                                Magnesium Sulfate received:  No BMZ received: No Rhophylac: N/A MMR: N/A T-DaP: Given prenatally Flu: Given prenatally  Transfusion: No  Physical exam  Vitals:   09/08/21 1446 09/08/21 1723 09/08/21 2013 09/09/21 0612  BP: 127/90 134/80 118/80 112/65  Pulse: (!) 106 84 76 88  Resp: 18 18 18 16   Temp: 97.7 F (36.5 C)  97.6 F (36.4 C) 97.6 F (36.4 C)  TempSrc: Oral  Oral Oral  SpO2:  99% 99% 98%  Weight:      Height:       General: alert Lochia: appropriate Uterine Fundus: firm Incision: Dressing is clean, dry, and intact DVT Evaluation: No evidence of DVT seen on physical exam.  Labs: Lab Results  Component Value Date   WBC 12.5 (H) 09/07/2021   HGB 9.6 (L) 09/07/2021   HCT 27.7 (L) 09/07/2021   MCV 90.5 09/07/2021   PLT 150 09/07/2021      Latest Ref Rng & Units 09/05/2021    6:37 AM  CMP  Glucose 70 - 99 mg/dL 98   BUN 6 - 20 mg/dL 7   Creatinine 0.44 - 1.00 mg/dL 0.64   Sodium 135 - 145 mmol/L 136   Potassium 3.5 - 5.1 mmol/L 3.6   Chloride 98 - 111 mmol/L  105   CO2 22 - 32 mmol/L 18   Calcium 8.9 - 10.3 mg/dL 9.1   Total Protein 6.5 - 8.1 g/dL 6.7   Total Bilirubin 0.3 - 1.2 mg/dL 0.4   Alkaline Phos 38 - 126 U/L 180   AST 15 - 41 U/L 19   ALT 0 - 44 U/L 14    Edinburgh Score:    09/07/2021    9:45 AM  Edinburgh Postnatal Depression Scale Screening Tool  I have been able to laugh and see the funny side of things. 0  I have looked forward with enjoyment to things. 0  I have blamed myself unnecessarily when things went wrong. 1  I have been anxious or worried for no good reason. 0  I have felt scared or panicky for no good reason. 0  Things have been getting on top of me. 1  I have been so unhappy that I have had difficulty sleeping. 0  I have felt sad or miserable. 0  I have been so unhappy that I have been crying. 1  The thought of harming myself has occurred to me. 0  Edinburgh Postnatal Depression Scale Total 3     After visit meds:  Allergies as of 09/09/2021   No  Known Allergies      Medication List     STOP taking these medications    aspirin EC 81 MG tablet   Blood Pressure Kit Devi       TAKE these medications    acetaminophen 500 MG tablet Commonly known as: TYLENOL Take 2 tablets (1,000 mg total) by mouth every 6 (six) hours.   albuterol 108 (90 Base) MCG/ACT inhaler Commonly known as: VENTOLIN HFA Inhale 2 puffs into the lungs every 6 (six) hours as needed for wheezing or shortness of breath.   furosemide 20 MG tablet Commonly known as: LASIX Take 1 tablet (20 mg total) by mouth daily for 3 days.   ibuprofen 600 MG tablet Commonly known as: ADVIL Take 1 tablet (600 mg total) by mouth every 6 (six) hours.   loratadine 10 MG tablet Commonly known as: Claritin Take 1 tablet (10 mg total) by mouth daily.   NIFEdipine 30 MG 24 hr tablet Commonly known as: ADALAT CC Take 1 tablet (30 mg total) by mouth daily.   oxyCODONE 5 MG immediate release tablet Commonly known as: Oxy IR/ROXICODONE Take 1 tablet (5 mg total) by mouth every 6 (six) hours as needed for severe pain.   polyethylene glycol 17 g packet Commonly known as: MIRALAX / GLYCOLAX Take 17 g by mouth daily.   prenatal vitamin w/FE, FA 29-1 MG Chew chewable tablet Chew 1 tablet by mouth daily at 12 noon.   senna-docusate 8.6-50 MG tablet Commonly known as: Senokot-S Take 2 tablets by mouth daily for 15 days.         Discharge home in stable condition Infant Feeding: Bottle Infant Disposition: home with mother Discharge instruction: per After Visit Summary and Postpartum booklet. Activity: Advance as tolerated. Pelvic rest for 6 weeks.  Diet: routine diet Future Appointments: Future Appointments  Date Time Provider [place]  09/12/2021 11:00 AM [place] None  10/16/2021 10:15 AM Constant, Vickii Chafe, MD [place] None   Follow up Visit: Message sent to [facility] by Dr. Gwenlyn Perking on 09/06/21.   Please schedule this patient for a In person  postpartum visit in 6 weeks with the following provider: Any provider. Additional Postpartum F/U: Incision check 1 week and BP  check 1 week  Low risk pregnancy complicated by: Obesity, gHTN intrapartum  Delivery mode:  C-Section, Low Transverse  Anticipated Birth Control:  Condoms  Renard Matter, MD, MPH OB Fellow, Faculty Practice

## 2021-09-06 NOTE — Progress Notes (Signed)
Delayed documentation  Assessed patient at bedside multiple times in the past two hours.  At times with repetitive late decelerations. Sometimes resolve with position changes and fluid boluses and then recurs. Additionally, cervix remains 4-5 cm (more edematous now), 70-80%effaced, and -2 to -3 station. Suspect some descent but also head position continues to be asynclitic at the very least if not still OP despite facilitative position changes throughout last few hours (unable to fully assess as patient unable to tolerate thorough exam of fontanelles in setting of only 4 cm dilated and head high in pelvis).   Pitocin currently at 14/hr. MVUs currently not adequate and pattern irregular in setting of asynclitism. Given patient has been on pitocin for ~ 15 hours now and has been AROMed for 15 hours as well without cervical change will trial pitocin break for 2 hours to reset receptors as well as allow for fetal recovery (given Cat II tracing with periods of recurrent lates). It is possible that when restart pitocin similar pattern repeats itself and at that time if no advance in cervical dilation with continued cat II tracing would be concern for fetal intolerance as well as failed induction/arrest of dilation.   Discussed with patient in depth regarding findings and patient amenable to pitocin break at this time. Will plan to restart at 2x2 in 2 hours.   Discussed with patient that when restart pitocin if tracing concerning for fetal intolerance or after adequate period of time and/or adequate MVUs cervix continues to be unchanged at that time team would further consider and discuss a cesarean delivery. We discussed that we try to avoid cesarean delivery if possible given increased risks/recovery time and also this being her first delivery however ultimately maternal and fetal safety are most important and therefore if after giving adequate time and trying all the methods if remains unchanged with concern  for fetal intolerance we would then recommend a cesarean delivery. Patient expresses understanding and in agreement with plan.  Discussed with Dr. Alysia Penna who is in agreement with plan  Warner Mccreedy, MD, MPH OB Fellow, Faculty Practice

## 2021-09-07 ENCOUNTER — Encounter (HOSPITAL_COMMUNITY): Payer: Self-pay | Admitting: Obstetrics and Gynecology

## 2021-09-07 LAB — CBC
HCT: 27.7 % — ABNORMAL LOW (ref 36.0–46.0)
Hemoglobin: 9.6 g/dL — ABNORMAL LOW (ref 12.0–15.0)
MCH: 31.4 pg (ref 26.0–34.0)
MCHC: 34.7 g/dL (ref 30.0–36.0)
MCV: 90.5 fL (ref 80.0–100.0)
Platelets: 150 10*3/uL (ref 150–400)
RBC: 3.06 MIL/uL — ABNORMAL LOW (ref 3.87–5.11)
RDW: 13.3 % (ref 11.5–15.5)
WBC: 12.5 10*3/uL — ABNORMAL HIGH (ref 4.0–10.5)
nRBC: 0 % (ref 0.0–0.2)

## 2021-09-07 NOTE — Lactation Note (Signed)
This note was copied from a baby's chart. Lactation Consultation Note  Patient Name: Gloria Cox XBWIO'M Date: 09/07/2021 Reason for consult: Follow-up assessment;Term;Primapara;1st time breastfeeding Age:24 hours  P1 mother whose infant is now 59 hours old.  This is a term baby at 40+3 weeks.  Mother's current feeding preference is breast/formula.  Mother was interested in latching when I arrived.  Reviewed breast feeding basics and taught hand expression; mother excited to see colostrum drops.  Finger fed drops to baby.  Assisted to latch easily in the football hold.  With intermittent gentle stimulation baby was able to feed well for 12 minutes.  Mother verbalized that she cluster fed all night long last night.  Discussed the possibility of cluster feeding tonight.  Allowed time for lots of questions and verbalization.  Mother was not sure she would "be able to breastfeed."  Her mother and sister were not successful and she was worried.  Reassurance given.  Suggested mother refrain from formula in the hospital and put baby to the breast with cues.  Encouraged her to call her RN/LC for latch assistance as needed.  Mother appreciative.  Sister present and supportive.  Mother has a [facility] appointment for June 20.  Advised to call tomorrow to discuss benefits and eligibility.  Mother plans to follow through.  She does not currently have an electric pump for home use.     Maternal Data Has patient been taught Hand Expression?: Yes Does the patient have breastfeeding experience prior to this delivery?: No  Feeding Mother's Current Feeding Choice: Breast Milk and Formula  LATCH Score Latch: Grasps breast easily, tongue down, lips flanged, rhythmical sucking.  Audible Swallowing: A few with stimulation  Type of Nipple: Everted at rest and after stimulation  Comfort (Breast/Nipple): Soft / non-tender  Hold (Positioning): Assistance needed to correctly position infant at breast and  maintain latch.  LATCH Score: 8   Lactation Tools Discussed/Used Tools: Shells;Pump;Flanges Flange Size: 24 Breast pump type: Manual Reason for Pumping: Breast stimulation Pumping frequency: PRN  Interventions Interventions: Assisted with latch;Breast feeding basics reviewed;Skin to skin;Breast massage;Hand express;Breast compression;Hand pump;Shells;Expressed milk;Position options;Support pillows;Adjust position;Education  Discharge Pump: Manual WIC Program: Yes  Consult Status Consult Status: Follow-up Date: 09/08/21 Follow-up type: In-patient    Dora Sims 09/07/2021, 12:14 PM

## 2021-09-07 NOTE — Progress Notes (Signed)
POSTPARTUM PROGRESS NOTE  POD #1  Subjective:  Gloria Cox is a 26 y.o. G1P1001 s/p pLTCS at [redacted]w[redacted]d. Today she notes no acute complaints. She denies any problems with ambulating, voiding or po intake. Denies nausea or vomiting. She has passed flatus, no BM.  Pain is well controlled.  Lochia moderate Denies fever/chills/chest pain/SOB.  no HA, no blurry vision, no RUQ pain  Objective: Blood pressure 132/73, pulse 82, temperature 98.6 F (37 C), temperature source Oral, resp. rate 18, height 5\' 3"  (1.6 m), weight 110.2 kg, last menstrual period 10/12/2020, SpO2 100 %, unknown if currently breastfeeding.  Physical Exam:  General: alert, cooperative and no distress Chest: no respiratory distress Heart: regular rate and rhythm Abdomen: soft, nontender, +BS Uterine Fundus: firm, appropriately tender Incision: C/D/I with honeycomb DVT Evaluation: No calf swelling or tenderness Extremities: no edema Skin: warm, dry  Results for orders placed or performed during the hospital encounter of 09/05/21 (from the past 24 hour(s))  ABO/Rh     Status: None   Collection Time: 09/06/21 11:13 AM  Result Value Ref Range   ABO/RH(D)      O POS Performed at [facility], [address] [place], [place]   CBC     Status: Abnormal   Collection Time: 09/07/21  5:20 AM  Result Value Ref Range   WBC 12.5 (H) 4.0 - 10.5 K/uL   RBC 3.06 (L) 3.87 - 5.11 MIL/uL   Hemoglobin 9.6 (L) 12.0 - 15.0 g/dL   HCT 11/07/21 (L) 73.5 - 32.9 %   MCV 90.5 80.0 - 100.0 fL   MCH 31.4 26.0 - 34.0 pg   MCHC 34.7 30.0 - 36.0 g/dL   RDW 92.4 26.8 - 34.1 %   Platelets 150 150 - 400 K/uL   nRBC 0.0 0.0 - 0.2 %    Assessment/Plan: Gloria Cox is a 26 y.o. G1P1001 s/p pLTCS at [redacted]w[redacted]d POD#1  -routine postop care -pain well controlled -plan to remove foley this am, encourage ambulation -Lovenox for ppx  Contraception: condoms Feeding: bottle  Dispo: continue with routine postop care   LOS: 2  days   [redacted]w[redacted]d, DO Faculty Attending, Center for [facility] 09/07/2021, 8:00 AM

## 2021-09-08 MED ORDER — FUROSEMIDE 20 MG PO TABS
20.0000 mg | ORAL_TABLET | Freq: Every day | ORAL | Status: DC
  Administered 2021-09-08 – 2021-09-09 (×2): 20 mg via ORAL
  Filled 2021-09-08 (×2): qty 1

## 2021-09-08 MED ORDER — FERROUS SULFATE 325 (65 FE) MG PO TABS
325.0000 mg | ORAL_TABLET | ORAL | Status: DC
  Administered 2021-09-08: 325 mg via ORAL
  Filled 2021-09-08: qty 1

## 2021-09-08 MED ORDER — NIFEDIPINE ER OSMOTIC RELEASE 30 MG PO TB24
30.0000 mg | ORAL_TABLET | Freq: Every day | ORAL | Status: DC
  Administered 2021-09-08 – 2021-09-09 (×2): 30 mg via ORAL
  Filled 2021-09-08 (×2): qty 1

## 2021-09-08 MED ORDER — POLYETHYLENE GLYCOL 3350 17 G PO PACK
17.0000 g | PACK | Freq: Every day | ORAL | Status: DC
  Administered 2021-09-08: 17 g via ORAL
  Filled 2021-09-08 (×2): qty 1

## 2021-09-08 NOTE — Progress Notes (Signed)
POSTPARTUM PROGRESS NOTE  Subjective: Gloria Cox is a 26 y.o. G1P1001 s/p pLTCS at [redacted]w[redacted]d.  She reports she doing well. No acute events overnight. She denies any problems with ambulating, voiding or po intake. Denies nausea or vomiting. She has  passed flatus. Pain is moderately controlled.  Lochia is slowing down.  Objective: Blood pressure 116/78, pulse 80, temperature 97.9 F (36.6 C), temperature source Axillary, resp. rate 18, height 5\' 3"  (1.6 m), weight 110.2 kg, last menstrual period 10/12/2020, SpO2 99 %, unknown if currently breastfeeding.  Physical Exam:  General: alert, cooperative and no distress Chest: no respiratory distress Abdomen: soft, non-tender, + bowel sounds Uterine Fundus: firm and at level of umbilicus Incision: c/d/I, small amount of dried blood on left lateral bandage but no fluid or continued bleeding Extremities: No calf swelling or tenderness  1+ bilateral edema, Homan's negative  Recent Labs    09/07/21 0520  HGB 9.6*  HCT 27.7*    Assessment/Plan: VERLEY PARISEAU is a 26 y.o. G1P1001 s/p pLTCS at [redacted]w[redacted]d for arrest of dilation.  Routine Postpartum Care: POD#2, Doing well, pain moderately controlled, + gas but no BM since Thursday, added Miralax -- Continue routine care, lactation support  -- Contraception: condoms -- Feeding: formula  Gestational HTN - labs negative, BP mildly elevated 130s/80s PP, asymptomatic, started Lasix and Procardia today  ABLA - Hgb 9.6 post-op, PO ferrous sulfate added  Dispo: Plan for discharge tomorrow.  Sunday, MD Faculty Practice, Center for [facility] 09/08/2021 10:11 AM

## 2021-09-08 NOTE — Lactation Note (Signed)
This note was copied from a baby's chart. Lactation Consultation Note  Patient Name: Gloria Cox FAOZH'Y Date: 09/08/2021 Reason for consult: Follow-up assessment;1st time breastfeeding;Primapara;Maternal endocrine disorder;Term;Nipple pain/trauma Age:24 hours  LC in to visit with P1 Mom of term baby.  Baby is at 6.9% weight loss and 2 voids and 1 stool in last 24 hrs.  Mom has been alternating breast and formula by bottle.  Baby just had a 30 ml formula feeding by bottle.    Mom concerned whether baby is latching well.  Right nipple is abraded at tip.   Talked about the benefit of stimulating her milk supply because of numerous bottle supplementation.  Mom agreeable.    LC set up DEBP and assisted Mom to pump for first time, 24 mm flanges appear to be correct size.  Mom pumped for almost 15 mins (collecting 5 ml) and baby started getting fussy and cueing.  Assisted Mom to latch baby on left breast in football hold.  Rolled up cloth placed under breast for support.  Baby able to attain a deep latch, upper lip untucked to flange and chin pulled down to open mouth wider onto breast.  Baby fed for 10 mins before slipping off.  Nipple was not pinched.  Demonstrated and assisted FOB with finger feeding 5 ml of colostrum.  Baby appeared contented following.   LC noted a highly arched palate, labial frenulum to gum line and a posterior short lingual frenulum.  With crying, baby's tongue elevates about only 20%.  Talked about the importance of OP lactation consult after discharge once milk comes to volume.  Reviewed importance of disassembling pump parts, washing, rinsing and air drying in separate bin to dry.  LC performed this for first time.  Plan recommended- 1- Keep baby STS as much as possible 2- Offer the breast with feeding cues, making sure baby's mouth is wide and deep on the breast.  Ask for help prn 3-Pump both breasts on initiation setting 4- supplement with EBM+/formula per  volume guidelines  Mom does not have WIC yet, has an appt 6/20, but called to hopefully speed this up.  Mom does not have a DEBP at home.     LATCH Score Latch: Grasps breast easily, tongue down, lips flanged, rhythmical sucking.  Audible Swallowing: A few with stimulation  Type of Nipple: Everted at rest and after stimulation  Comfort (Breast/Nipple): Filling, red/small blisters or bruises, mild/mod discomfort  Hold (Positioning): Assistance needed to correctly position infant at breast and maintain latch.  LATCH Score: 7   Lactation Tools Discussed/Used Tools: Pump;Flanges;Coconut oil;Bottle Flange Size: 24 Breast pump type: Double-Electric Breast Pump;Manual Pump Education: Setup, frequency, and cleaning;Milk Storage Reason for Pumping: Support milk supply/supplementing breastfeeding with formula Pumping frequency: Mom to pump after breastfeeding Pumped volume: 5 mL  Interventions Interventions: Breast feeding basics reviewed;Assisted with latch;Skin to skin;Breast massage;Hand express;Adjust position;Support pillows;Position options;Expressed milk;Coconut oil;Hand pump;DEBP;Pace feeding  Discharge Pump: Manual WIC Program: No  Consult Status Consult Status: Follow-up Date: 09/09/21 Follow-up type: In-patient    Gloria Cox 09/08/2021, 12:38 PM

## 2021-09-08 NOTE — Social Work (Signed)
MOB was referred for history of depression/anxiety.  * Referral screened out by Clinical Social Worker because none of the following criteria appear to apply: ~ History of anxiety/depression during this pregnancy, or of post-partum depression following prior delivery.  ~ Diagnosis of anxiety and/or depression within last 3 years. Per chart review, Adjustment disorder, Anxiety and  depressed mood noted in 2016 and Dysthymia noted in 2013. No mental health concerns noted in OB record. MOB Edinburgh score:3.   OR * MOB's symptoms currently being treated with medication and/or therapy.  Please contact the Clinical Social Worker if needs arise, by [facility] request, or if MOB scores greater than 9/yes to question 10 on Edinburgh Postpartum Depression Screen.   Kathrin Greathouse, MSW, LCSW Women's and [place] Worker  [phone] 09/08/2021  9:18 AM

## 2021-09-09 ENCOUNTER — Other Ambulatory Visit (HOSPITAL_COMMUNITY): Payer: Self-pay

## 2021-09-09 MED ORDER — POLYETHYLENE GLYCOL 3350 17 GM/SCOOP PO POWD
17.0000 g | Freq: Every day | ORAL | 0 refills | Status: DC
  Filled 2021-09-09: qty 238, 14d supply, fill #0

## 2021-09-09 MED ORDER — IBUPROFEN 600 MG PO TABS
600.0000 mg | ORAL_TABLET | Freq: Four times a day (QID) | ORAL | 0 refills | Status: DC
  Filled 2021-09-09: qty 30, 8d supply, fill #0

## 2021-09-09 MED ORDER — ACETAMINOPHEN 500 MG PO TABS
1000.0000 mg | ORAL_TABLET | Freq: Four times a day (QID) | ORAL | 0 refills | Status: DC
  Filled 2021-09-09: qty 30, 4d supply, fill #0

## 2021-09-09 MED ORDER — FUROSEMIDE 20 MG PO TABS
20.0000 mg | ORAL_TABLET | Freq: Every day | ORAL | 0 refills | Status: DC
  Filled 2021-09-09: qty 3, 3d supply, fill #0

## 2021-09-09 MED ORDER — NIFEDIPINE ER 30 MG PO TB24
30.0000 mg | ORAL_TABLET | Freq: Every day | ORAL | 0 refills | Status: DC
  Filled 2021-09-09: qty 60, 60d supply, fill #0

## 2021-09-09 MED ORDER — SENNOSIDES-DOCUSATE SODIUM 8.6-50 MG PO TABS
2.0000 | ORAL_TABLET | Freq: Every day | ORAL | 0 refills | Status: AC
  Filled 2021-09-09: qty 30, 15d supply, fill #0

## 2021-09-09 MED ORDER — OXYCODONE HCL 5 MG PO TABS
5.0000 mg | ORAL_TABLET | Freq: Four times a day (QID) | ORAL | 0 refills | Status: DC | PRN
  Filled 2021-09-09: qty 10, 3d supply, fill #0

## 2021-09-09 NOTE — Lactation Note (Addendum)
This note was copied from a baby's chart. Lactation Consultation Note  Patient Name: Gloria Cox IOMBT'D Date: 09/09/2021 Reason for consult: Follow-up assessment;1st time breastfeeding;Primapara;Term;Difficult latch;Nipple pain/trauma Age:24 hours  LC in to visit with P1 Mom of term baby on day of discharge.  Baby's weight if at 4% today, up from 7% yesterday. Baby has been supplemented with 30-60 ml formula.  Mom states baby is breastfeeding much better today.  Mom hasn't pumped since yesterday one time.    Baby acting hungry and Mom was able to latch baby easily on left breast in football hold.  Baby appear nutritive with deep jaw extensions and swallows occasionally.  Mom denies pain with latch.    Baby fed for 10 mins before coming off the breast and was still cueing after her burp.  Offered to assist with baby latching to right breast, but Mom was feeling too tired.  Assisted FOB to pace bottle feed baby formula and assisted Mom to double pump the right breast (per Mom's choice due to her arm being painful).  Mom expressed 8 ml from one breast.  Reviewed cleaning procedure of pump parts, and breast milk storage guidelines.    Mom to [facility] baby to the [facility] for Pediatric care.  Message sent to [facility].  Engorgement prevention and treatment reviewed.  Encouraged STS with baby, and always to offer the breast first with cues.  LC recommended pumping her breasts whenever baby is supplemented by bottle.  Mom aware of OP lactation support available. Feeding Nipple Type: Other (Slow flow bottle from home by [employer])  LATCH Score Latch: Grasps breast easily, tongue down, lips flanged, rhythmical sucking.  Audible Swallowing: A few with stimulation  Type of Nipple: Everted at rest and after stimulation  Comfort (Breast/Nipple): Filling, red/small blisters or bruises, mild/mod discomfort  Hold (Positioning): Assistance needed to correctly position infant at breast  and maintain latch.  LATCH Score: 7   Lactation Tools Discussed/Used Tools: Pump;Flanges;Coconut oil;Bottle Flange Size: 24 Breast pump type: Double-Electric Breast Pump;Manual Pump Education: Setup, frequency, and cleaning;Milk Storage Reason for Pumping: Support milk supply Pumping frequency: Encouraged Mom to pump both breast after breastfeeding if baby is supplemented  Interventions Interventions: Breast feeding basics reviewed;Assisted with latch;Skin to skin;Breast massage;Hand express;Adjust position;Breast compression;Support pillows;Position options;Expressed milk;Coconut oil;Hand pump;DEBP;Education;Pace feeding  Discharge Discharge Education: Engorgement and breast care;Warning signs for feeding baby;Outpatient recommendation;Outpatient Epic message sent Pump: Manual WIC Program: Yes  Consult Status Consult Status: Complete Date: 09/09/21 Follow-up type: Call as needed    Judee Clara 09/09/2021, 11:31 AM

## 2021-09-10 ENCOUNTER — Inpatient Hospital Stay (HOSPITAL_COMMUNITY): Payer: Medicaid Other

## 2021-09-10 ENCOUNTER — Telehealth: Payer: Self-pay | Admitting: *Deleted

## 2021-09-10 NOTE — Telephone Encounter (Signed)
TC to follow up on elevated BP reading per Babyscripts around 12:30 on day of postpartum discharge. No answer. No VM available. Message sent via MyChart.

## 2021-09-11 ENCOUNTER — Telehealth: Payer: Self-pay | Admitting: *Deleted

## 2021-09-11 NOTE — Telephone Encounter (Signed)
TC to patient and alternate numbers regarding concerns for elevated BP, abdominal pain and headache. No answers, no working numbers. Second MyChart message sent urging pt to seek care or call the office and update phone number in addition. Pt appears to have read message from 09/10/21.

## 2021-09-12 ENCOUNTER — Ambulatory Visit (INDEPENDENT_AMBULATORY_CARE_PROVIDER_SITE_OTHER): Payer: Medicaid Other

## 2021-09-12 VITALS — BP 144/82 | HR 76

## 2021-09-12 DIAGNOSIS — Z5189 Encounter for other specified aftercare: Secondary | ICD-10-CM

## 2021-09-12 DIAGNOSIS — Z0130 Encounter for examination of blood pressure without abnormal findings: Secondary | ICD-10-CM

## 2021-09-12 NOTE — Progress Notes (Signed)
Agree with nurses's documentation of this patient's clinic encounter.  Becker Christopher L, MD  

## 2021-09-12 NOTE — Progress Notes (Signed)
..  Subjective:  Gloria Cox is a 26 y.o. female here for BP check. Pt states that she forgot to take her medications this morning but she has been previously taking them. Pt also denies any complications with her incision.  Hypertension ROS: taking medications as instructed, no medication side effects noted, no TIA's, no chest pain on exertion, no dyspnea on exertion, and no swelling of ankles.    Objective:  BP (!) 144/82   Pulse 76   LMP 10/12/2020   Appearance alert, well appearing, and in no distress. General exam BP noted to be slightly elevated today in office due to missing medication this morning.   Removed honeycomb dressing in office today. Incision appears clean, dry, intact, with no signs of infection.    Assessment:   Blood Pressure stable.   Plan:  Current treatment plan is effective, no change in therapy.. Advised of abnormal signs/symptoms to report. PP visit scheduled for 10/16/21

## 2021-09-16 ENCOUNTER — Telehealth: Payer: Self-pay

## 2021-09-16 NOTE — Telephone Encounter (Signed)
Received an elevated BP reading from baby scripts of 144/82. Called patient to follow up. BP at this time is 115/84. Denies headaches or visual changes. Patient states that she is still taking her BP medication. Advised patient to continue to monitor and when to seek evaluation for increased blood pressures. Patient verbalized understanding.

## 2021-10-07 ENCOUNTER — Encounter: Payer: Self-pay | Admitting: Obstetrics

## 2021-10-07 DIAGNOSIS — J45909 Unspecified asthma, uncomplicated: Secondary | ICD-10-CM

## 2021-10-07 MED ORDER — ALBUTEROL SULFATE HFA 108 (90 BASE) MCG/ACT IN AERS: 2.0000 | INHALATION_SPRAY | Freq: Four times a day (QID) | RESPIRATORY_TRACT | 2 refills | Status: DC | PRN

## 2021-10-09 ENCOUNTER — Encounter: Payer: Self-pay | Admitting: Student

## 2021-10-16 ENCOUNTER — Encounter: Payer: Self-pay | Admitting: Obstetrics and Gynecology

## 2021-10-16 ENCOUNTER — Ambulatory Visit (INDEPENDENT_AMBULATORY_CARE_PROVIDER_SITE_OTHER): Payer: Medicaid Other | Admitting: Obstetrics and Gynecology

## 2021-10-16 MED ORDER — NORETHIN ACE-ETH ESTRAD-FE 1-20 MG-MCG(24) PO TABS: 1.0000 | ORAL_TABLET | Freq: Every day | ORAL | 11 refills | Status: DC

## 2021-10-16 NOTE — Progress Notes (Signed)
Post Partum Visit Note  Gloria Cox is a 26 y.o. G61P1001 female who presents for a postpartum visit. She is 5 weeks postpartum following a primary cesarean section.  I have fully reviewed the prenatal and intrapartum course. The delivery was at 40 gestational weeks.  Anesthesia: epidural. Postpartum course has been a little rough. Patient states she has been crying a lot and on edge. Baby is doing well. Baby is feeding by both breast and bottle - Enfamil with Iron. Bleeding no bleeding. Bowel function is normal. Bladder function is normal. Patient is sexually active. Contraception method is none. Postpartum depression screening: 8. Patient has a good support system and does not feel she needs a counselor at this time. Patient has been having difficulty sleeping and would like to discuss medication options. Denies any drainage or odor from incision.    Upstream - 10/16/21 1038       Pregnancy Intention Screening   Does the patient want to become pregnant in the next year? No    Does the patient's partner want to become pregnant in the next year? No    Would the patient like to discuss contraceptive options today? Yes      Contraception Wrap Up   Current Method No Contraceptive Precautions            The pregnancy intention screening data noted above was reviewed. Potential methods of contraception were discussed. The patient would like to talk about [facility] options.    Edinburgh Postnatal Depression Scale - 10/16/21 1034       Edinburgh Postnatal Depression Scale:  In the Past 7 Days   I have been able to laugh and see the funny side of things. 1    I have looked forward with enjoyment to things. 1    I have blamed myself unnecessarily when things went wrong. 3    I have been anxious or worried for no good reason. 0    I have felt scared or panicky for no good reason. 2    Things have been getting on top of me. 0    I have been so unhappy that I have had difficulty sleeping. 0     I have felt sad or miserable. 0    I have been so unhappy that I have been crying. 1   Crying a lot because of hormones   The thought of harming myself has occurred to me. 0    Edinburgh Postnatal Depression Scale Total 8             Health Maintenance Due  Topic Date Due   COVID-19 Vaccine (1) Never done   HPV VACCINES (1 - 2-dose series) Never done       Review of Systems Pertinent items noted in HPI and remainder of comprehensive ROS otherwise negative.  Objective:  BP 117/79   Pulse 79   Ht 5\' 2"  (1.575 m)   Wt 227 lb 4.8 oz (103.1 kg)   LMP 10/12/2020   BMI 41.57 kg/m    General:  alert, cooperative, and no distress   Breasts:  normal  Lungs: clear to auscultation bilaterally  Heart:  regular rate and rhythm  Abdomen: soft, non-tender; bowel sounds normal; no masses,  no organomegaly   Wound well approximated incision  GU exam:  not indicated       Assessment:    Normal postpartum exam.   Plan:   Essential components of care per ACOG recommendations:  1.  Mood and well being: Patient with negative depression screening today. Reviewed local resources for support.  - Patient tobacco use? No.   - hx of drug use? No.    2. Infant care and feeding:  -Patient currently breastmilk feeding? No.  -Social determinants of health (SDOH) reviewed in EPIC. No concerns  3. Sexuality, contraception and birth spacing - Patient does not want a pregnancy in the next year.  Desired family size is 2 children.  - Reviewed reproductive life planning. Reviewed contraceptive methods based on pt preferences and effectiveness.  Patient desired Oral Contraceptive today.  Rx Loestrin provided - Discussed birth spacing of 18 months  4. Sleep and fatigue -Encouraged family/partner/community support of 4 hrs of uninterrupted sleep to help with mood and fatigue  5. Physical Recovery  - Discussed patients delivery and complications. She describes her labor as good. - Patient  had a C-section failure to progress. - Patient has urinary incontinence? No. - Patient is safe to resume physical and sexual activity  6.  Health Maintenance - HM due items addressed Yes - Last pap smear  Diagnosis  Date Value Ref Range Status  02/12/2021   Final   - Negative for intraepithelial lesion or malignancy (NILM)   Pap smear not done at today's visit.  -Breast Cancer screening indicated? No.   7. Chronic Disease/Pregnancy Condition follow up: Hypertension Normotensive without procardia. Patient may discontinue medication Discussed over the counter sleeping remedies  - PCP follow up  Catalina Antigua, MD [facility], [facility]

## 2021-11-07 ENCOUNTER — Encounter: Payer: Self-pay | Admitting: Student

## 2021-12-07 ENCOUNTER — Other Ambulatory Visit (HOSPITAL_COMMUNITY): Payer: Self-pay

## 2021-12-07 ENCOUNTER — Ambulatory Visit (HOSPITAL_COMMUNITY)
Admission: EM | Admit: 2021-12-07 | Discharge: 2021-12-07 | Disposition: A | Discharge status: Home / Self Care | Payer: Medicaid Other | Attending: Physician Assistant | Admitting: Physician Assistant

## 2021-12-07 ENCOUNTER — Encounter (HOSPITAL_COMMUNITY): Payer: Self-pay | Admitting: *Deleted

## 2021-12-07 DIAGNOSIS — J069 Acute upper respiratory infection, unspecified: Secondary | ICD-10-CM | POA: Diagnosis present

## 2021-12-07 DIAGNOSIS — Z20822 Contact with and (suspected) exposure to covid-19: Secondary | ICD-10-CM | POA: Insufficient documentation

## 2021-12-07 LAB — RESP PANEL BY RT-PCR (FLU A&B, COVID) ARPGX2
Influenza A by PCR: NEGATIVE
Influenza B by PCR: NEGATIVE
SARS Coronavirus 2 by RT PCR: NEGATIVE

## 2021-12-07 NOTE — Discharge Instructions (Signed)
Your COVID test is pending, will call with test results If your COVID test is positive we recommend staying home away from others for 5 days and then wear a mask around others for 5 days.  Recommend supportive care.  Drink plenty of fluids, rest.  Can use Flonase and Mucinex.  Can take Ibuprofen or Tylenol as needed for headache.  Return for evaluation if symptoms become worse.

## 2021-12-07 NOTE — ED Triage Notes (Addendum)
C/O right ear pain, nasal congestion and pressure, HA, cough onset 2 days ago. Last night started with transient loss of taste & smell. Unsure if fevers.

## 2021-12-07 NOTE — ED Provider Notes (Signed)
[facility]    CSN: [id number] Arrival date & time: 12/07/21  1003      History   Chief Complaint Chief Complaint  Patient presents with   Otalgia   Cough    HPI Gloria Cox is a 26 y.o. female.   Pt presents with three days of cough, congestion, sinus pressure, headache, ear pain.  She reports sx initially started as sneezing and scratchy throat and have gradually progressed.  She has take otc sinus medications with minimal relief.  She has been taking Tylenol and Ibuprofen as needed for headache.  Unsure of fevers at home.  She denies vomiting, diarrhea, abdominal pain, body aches.  Pt has had her COVID vaccine.  She has a three month old baby at home. Denies known sick contacts.     Past Medical History:  Diagnosis Date   Asthma    Dyspepsia    Goiter    Hypothyroidism    [place]    Pre-diabetes    Pre-diabetes    Striae    Thyroiditis, autoimmune     Patient Active Problem List   Diagnosis Date Noted   S/P cesarean section 09/06/2021   Gestational hypertension 09/06/2021   Post term pregnancy over 40 weeks 09/05/2021   Obesity in pregnancy 02/16/2021   History of asthma 02/16/2021   Alcohol use affecting pregnancy in first trimester 02/16/2021   Tobacco use affecting pregnancy in first trimester, antepartum 02/16/2021   Encounter for supervision of normal first pregnancy in third trimester 02/12/2021   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   Migraine without aura and without status migrainosus, not intractable 02/14/2015   Macromastia 02/20/2014   Hyperlipemia 05/09/2013   Dysthymia 08/19/2011    Past Surgical History:  Procedure Laterality Date   CESAREAN SECTION N/A 09/06/2021   Procedure: CESAREAN SECTION;  Surgeon: Edgewood Bing, MD;  [facility]: [facility];  Service: Obstetrics;  Laterality: N/A;   CHOLECYSTECTOMY N/A 12/18/2015   Procedure: LAPAROSCOPIC CHOLECYSTECTOMY;  Surgeon: Emelia Loron, MD;  Location: [facility]  ORS;  Service: General;  Laterality: N/A;   WISDOM TOOTH EXTRACTION  03/30/2012    OB History     Gravida  1   Para  1   Term  1   Preterm      AB      Living  1      SAB      IAB      Ectopic      Multiple  0   Live Births  1            Home Medications    Prior to Admission medications   Medication Sig Start Date End Date Taking? Authorizing Provider  albuterol (VENTOLIN HFA) 108 (90 Base) MCG/ACT inhaler Inhale 2 puffs into the lungs every 6 (six) hours as needed for wheezing or shortness of breath. 10/07/21  Yes Adam Phenix, MD  Norethindrone Acetate-Ethinyl Estrad-FE (LOESTRIN 24 FE) 1-20 MG-MCG(24) tablet Take 1 tablet by mouth daily. 10/16/21  Yes Constant, Peggy, MD  acetaminophen (TYLENOL) 500 MG tablet Take 2 tablets (1,000 mg total) by mouth every 6 (six) hours. 09/09/21   Warner Mccreedy, MD  furosemide (LASIX) 20 MG tablet Take 1 tablet (20 mg total) by mouth daily for 3 days. 09/09/21 09/12/21  Warner Mccreedy, MD  ibuprofen (ADVIL) 600 MG tablet Take 1 tablet (600 mg total) by mouth every 6 (six) hours. 09/09/21   Warner Mccreedy, MD  loratadine (CLARITIN) 10 MG  tablet Take 1 tablet (10 mg total) by mouth daily. Patient not taking: Reported on 10/16/2021 06/26/21   Brock Bad, MD  NIFEdipine (ADALAT CC) 30 MG 24 hr tablet Take 1 tablet (30 mg total) by mouth daily. Patient not taking: Reported on 10/16/2021 09/09/21 11/08/21  Warner Mccreedy, MD  oxyCODONE (OXY IR/ROXICODONE) 5 MG immediate release tablet Take 1 tablet (5 mg total) by mouth every 6 (six) hours as needed for severe pain. Patient not taking: Reported on 10/16/2021 09/09/21   Warner Mccreedy, MD  polyethylene glycol powder (GLYCOLAX/MIRALAX) 17 GM/SCOOP powder Take 17 g by mouth daily. Patient not taking: Reported on 10/16/2021 09/09/21   Warner Mccreedy, MD  prenatal vitamin w/FE, FA (NATACHEW) 29-1 MG CHEW chewable tablet Chew 1 tablet by mouth daily at 12 noon.    [provider]  budesonide (PULMICORT)  180 MCG/ACT inhaler Inhale 2 puffs into the lungs as needed.  06/14/11  [provider]    Family History Family History  Problem Relation Age of Onset   Obesity Mother    Hypertension Father    Stroke Father    Obesity Sister    Obesity Brother    Diabetes Maternal Grandmother    Hypertension Maternal Grandmother    Obesity Maternal Grandmother    Diabetes Maternal Grandfather    Hypertension Maternal Grandfather    Heart Problems Maternal Grandfather 52   Diabetes Paternal Grandmother    Hypertension Paternal Grandmother    Diabetes Paternal Grandfather    Hypertension Paternal Grandfather     Social History Social History   Tobacco Use   Smoking status: Former    Packs/day: 0.10    Types: Cigarettes    Quit date: 03/09/2018    Years since quitting: 3.7   Smokeless tobacco: Never  Vaping Use   Vaping Use: Every day   Start date: 01/07/2021   Substances: Nicotine  Substance Use Topics   Alcohol use: Not Currently   Drug use: Not Currently    Types: Marijuana    Comment: none for years     Allergies   Patient has no known allergies.   Review of Systems Review of Systems  Constitutional:  Negative for chills and fever.  HENT:  Positive for congestion, ear pain and sinus pressure. Negative for sore throat.   Eyes:  Negative for pain and visual disturbance.  Respiratory:  Positive for cough. Negative for shortness of breath.   Cardiovascular:  Negative for chest pain and palpitations.  Gastrointestinal:  Negative for abdominal pain and vomiting.  Genitourinary:  Negative for dysuria and hematuria.  Musculoskeletal:  Negative for arthralgias and back pain.  Skin:  Negative for color change and rash.  Neurological:  Positive for headaches. Negative for seizures and syncope.  All other systems reviewed and are negative.    Physical Exam Triage Vital Signs ED Triage Vitals  Enc Vitals Group     BP 12/07/21 1012 125/87     Pulse Rate 12/07/21 1012  83     Resp 12/07/21 1012 16     Temp 12/07/21 1012 97.7 F (36.5 C)     Temp Source 12/07/21 1012 Oral     SpO2 12/07/21 1012 97 %     Weight --      Height --      Head Circumference --      Peak Flow --      Pain Score 12/07/21 1014 5     Pain Loc --  Pain Edu? --      Excl. in GC? --    No data found.  Updated Vital Signs BP 125/87   Pulse 83   Temp 97.7 F (36.5 C) (Oral)   Resp 16   LMP 12/05/2021   SpO2 97%   Breastfeeding No   Visual Acuity Right Eye Distance:   Left Eye Distance:   Bilateral Distance:    Right Eye Near:   Left Eye Near:    Bilateral Near:     Physical Exam Vitals and nursing note reviewed.  Constitutional:      General: She is not in acute distress.    Appearance: She is well-developed.  HENT:     Head: Normocephalic and atraumatic.     Right Ear: Hearing normal. Tympanic membrane is bulging.     Left Ear: Hearing and tympanic membrane normal.     Mouth/Throat:     Pharynx: Posterior oropharyngeal erythema present. No pharyngeal swelling or oropharyngeal exudate.  Eyes:     Conjunctiva/sclera: Conjunctivae normal.  Cardiovascular:     Rate and Rhythm: Normal rate and regular rhythm.     Heart sounds: No murmur heard. Pulmonary:     Effort: Pulmonary effort is normal. No respiratory distress.     Breath sounds: Normal breath sounds.  Abdominal:     Palpations: Abdomen is soft.     Tenderness: There is no abdominal tenderness.  Musculoskeletal:        General: No swelling.     Cervical back: Neck supple.  Skin:    General: Skin is warm and dry.     Capillary Refill: Capillary refill takes less than 2 seconds.  Neurological:     Mental Status: She is alert.  Psychiatric:        Mood and Affect: Mood normal.      UC Treatments / Results  Labs (all labs ordered are listed, but only abnormal results are displayed) Labs Reviewed  RESP PANEL BY RT-PCR (FLU A&B, COVID) [id number]    EKG   Radiology No results  found.  Procedures Procedures (including critical care time)  Medications Ordered in UC Medications - No data to display  Initial Impression / Assessment and Plan / UC Course  I have reviewed the triage vital signs and the nursing notes.  Pertinent labs & imaging results that were available during my care of the patient were reviewed by me and considered in my medical decision making (see chart for details).     Upper URI, viral in nature.  COVID and Flu test pending.  Discussed self isolation precautions if COVID positive.  Supportive care discussed.  Pt overall well appearing, non-toxic, in no acute distress.  Stable for discharge with supportive care.  Return precautions discussed.  Final Clinical Impressions(s) / UC Diagnoses   Final diagnoses:  Viral upper respiratory tract infection     Discharge Instructions      Your COVID test is pending, will call with test results If your COVID test is positive we recommend staying home away from others for 5 days and then wear a mask around others for 5 days.  Recommend supportive care.  Drink plenty of fluids, rest.  Can use Flonase and Mucinex.  Can take Ibuprofen or Tylenol as needed for headache.  Return for evaluation if symptoms become worse.   ED Prescriptions   None    PDMP not reviewed this encounter.   Ward, Tylene Fantasia, PA-C 12/07/21 1036

## 2021-12-08 ENCOUNTER — Other Ambulatory Visit (HOSPITAL_COMMUNITY): Payer: Self-pay

## 2021-12-11 ENCOUNTER — Ambulatory Visit (HOSPITAL_COMMUNITY)
Admission: RE | Admit: 2021-12-11 | Discharge: 2021-12-11 | Disposition: A | Discharge status: Home / Self Care | Payer: Medicaid Other | Source: Ambulatory Visit | Attending: Emergency Medicine | Admitting: Emergency Medicine

## 2021-12-11 VITALS — BP 118/81 | HR 82 | Temp 98.4°F | Resp 18

## 2021-12-11 DIAGNOSIS — J45901 Unspecified asthma with (acute) exacerbation: Secondary | ICD-10-CM

## 2021-12-11 DIAGNOSIS — J069 Acute upper respiratory infection, unspecified: Secondary | ICD-10-CM

## 2021-12-11 DIAGNOSIS — J45909 Unspecified asthma, uncomplicated: Secondary | ICD-10-CM

## 2021-12-11 MED ORDER — BENZONATATE 100 MG PO CAPS: 100.0000 mg | ORAL_CAPSULE | Freq: Three times a day (TID) | ORAL | 0 refills | Status: AC | PRN

## 2021-12-11 MED ORDER — IPRATROPIUM-ALBUTEROL 0.5-2.5 (3) MG/3ML IN SOLN
3.0000 mL | Freq: Once | RESPIRATORY_TRACT | Status: AC
Start: 2021-12-11 — End: 2021-12-11
  Administered 2021-12-11: 3 mL via RESPIRATORY_TRACT

## 2021-12-11 MED ORDER — IPRATROPIUM-ALBUTEROL 0.5-2.5 (3) MG/3ML IN SOLN
RESPIRATORY_TRACT | Status: AC
  Filled 2021-12-11: qty 3

## 2021-12-11 MED ORDER — ALBUTEROL SULFATE HFA 108 (90 BASE) MCG/ACT IN AERS: 1.0000 | INHALATION_SPRAY | Freq: Four times a day (QID) | RESPIRATORY_TRACT | 2 refills | Status: DC | PRN

## 2021-12-11 NOTE — ED Triage Notes (Signed)
Pt reports cough is not any better and worse at night.

## 2021-12-11 NOTE — Discharge Instructions (Addendum)
I recommend continuing symptomatic care at home.  You can use the cough medicine up to three times daily as needed. If this medicine makes you drowsy, take only at night.  I have refilled your inhaler to use every 6 hours as needed. Please follow up with your primary care provider.

## 2021-12-11 NOTE — ED Provider Notes (Signed)
[facility]    CSN: [id number] Arrival date & time: 12/11/21  7741      History   Chief Complaint Chief Complaint  Patient presents with   Cough   Nasal Congestion    HPI Gloria Cox is a 26 y.o. female.  Presents with continued cough and trouble taking deep breath. Was seen 4 days ago and diagnosed with viral URI.  COVID and flu were negative at that time. Reports productive cough. She has been taking Mucinex and using Flonase. History of asthma. Has an albuterol inhaler but she ran out.  No fevers  Past Medical History:  Diagnosis Date   Asthma    Dyspepsia    Goiter    Hypothyroidism    [place]    Pre-diabetes    Pre-diabetes    Striae    Thyroiditis, autoimmune     Patient Active Problem List   Diagnosis Date Noted   S/P cesarean section 09/06/2021   Gestational hypertension 09/06/2021   Post term pregnancy over 40 weeks 09/05/2021   Obesity in pregnancy 02/16/2021   History of asthma 02/16/2021   Alcohol use affecting pregnancy in first trimester 02/16/2021   Tobacco use affecting pregnancy in first trimester, antepartum 02/16/2021   Encounter for supervision of normal first pregnancy in third trimester 02/12/2021   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   Migraine without aura and without status migrainosus, not intractable 02/14/2015   Macromastia 02/20/2014   Hyperlipemia 05/09/2013   Dysthymia 08/19/2011    Past Surgical History:  Procedure Laterality Date   CESAREAN SECTION N/A 09/06/2021   Procedure: CESAREAN SECTION;  Surgeon: Bar Nunn Bing, MD;  Location: [facility];  Service: Obstetrics;  Laterality: N/A;   CHOLECYSTECTOMY N/A 12/18/2015   Procedure: LAPAROSCOPIC CHOLECYSTECTOMY;  Surgeon: Emelia Loron, MD;  Location: [facility]S;  Service: General;  Laterality: N/A;   WISDOM TOOTH EXTRACTION  03/30/2012    OB History     Gravida  1   Para  1   Term  1   Preterm      AB      Living  1       SAB      IAB      Ectopic      Multiple  0   Live Births  1            Home Medications    Prior to Admission medications   Medication Sig Start Date End Date Taking? Authorizing Provider  benzonatate (TESSALON) 100 MG capsule Take 1 capsule (100 mg total) by mouth every 8 (eight) hours as needed for up to 5 days for cough. 12/11/21 12/16/21 Yes Arturo Freundlich, Lurena Joiner, PA-C  acetaminophen (TYLENOL) 500 MG tablet Take 2 tablets (1,000 mg total) by mouth every 6 (six) hours. 09/09/21   Warner Mccreedy, MD  albuterol (VENTOLIN HFA) 108 (90 Base) MCG/ACT inhaler Inhale 1 puff into the lungs every 6 (six) hours as needed for wheezing or shortness of breath. 12/11/21   Annetta Deiss, Lurena Joiner, PA-C  furosemide (LASIX) 20 MG tablet Take 1 tablet (20 mg total) by mouth daily for 3 days. 09/09/21 09/12/21  Warner Mccreedy, MD  ibuprofen (ADVIL) 600 MG tablet Take 1 tablet (600 mg total) by mouth every 6 (six) hours. 09/09/21   Warner Mccreedy, MD  Norethindrone Acetate-Ethinyl Estrad-FE (LOESTRIN 24 FE) 1-20 MG-MCG(24) tablet Take 1 tablet by mouth daily. 10/16/21   Constant, Peggy, MD  prenatal vitamin w/FE, FA (NATACHEW) 29-1 MG CHEW  chewable tablet Chew 1 tablet by mouth daily at 12 noon.    [provider]  budesonide (PULMICORT) 180 MCG/ACT inhaler Inhale 2 puffs into the lungs as needed.  06/14/11  [provider]    Family History Family History  Problem Relation Age of Onset   Obesity Mother    Hypertension Father    Stroke Father    Obesity Sister    Obesity Brother    Diabetes Maternal Grandmother    Hypertension Maternal Grandmother    Obesity Maternal Grandmother    Diabetes Maternal Grandfather    Hypertension Maternal Grandfather    Heart Problems Maternal Grandfather 4   Diabetes Paternal Grandmother    Hypertension Paternal Grandmother    Diabetes Paternal Grandfather    Hypertension Paternal Grandfather     Social History Social History   Tobacco Use   Smoking status: Former     Packs/day: 0.10    Types: Cigarettes    Quit date: 03/09/2018    Years since quitting: 3.7   Smokeless tobacco: Never  Vaping Use   Vaping Use: Every day   Start date: 01/07/2021   Substances: Nicotine  Substance Use Topics   Alcohol use: Not Currently   Drug use: Not Currently    Types: Marijuana    Comment: none for years     Allergies   Patient has no known allergies.   Review of Systems Review of Systems  Respiratory:  Positive for cough.    Per HPI  Physical Exam Triage Vital Signs ED Triage Vitals [12/11/21 0911]  Enc Vitals Group     BP 118/81     Pulse Rate 82     Resp 18     Temp 98.4 F (36.9 C)     Temp Source Oral     SpO2 98 %     Weight      Height      Head Circumference      Peak Flow      Pain Score      Pain Loc      Pain Edu?      Excl. in GC?    No data found.  Updated Vital Signs BP 118/81 (BP Location: Left Arm)   Pulse 82   Temp 98.4 F (36.9 C) (Oral)   Resp 18   LMP 12/05/2021   SpO2 98%    Physical Exam Vitals and nursing note reviewed.  Constitutional:      General: She is not in acute distress. HENT:     Nose: Congestion present.     Mouth/Throat:     Mouth: Mucous membranes are moist.     Pharynx: Uvula midline. No posterior oropharyngeal erythema.     Tonsils: No tonsillar exudate or tonsillar abscesses.  Eyes:     Conjunctiva/sclera: Conjunctivae normal.  Cardiovascular:     Rate and Rhythm: Normal rate and regular rhythm.     Pulses: Normal pulses.     Heart sounds: Normal heart sounds.  Pulmonary:     Effort: Pulmonary effort is normal. No respiratory distress.     Breath sounds: Wheezing present.  Musculoskeletal:     Cervical back: Normal range of motion.  Lymphadenopathy:     Cervical: No cervical adenopathy.  Skin:    General: Skin is warm and dry.  Neurological:     Mental Status: She is alert and oriented to person, place, and time.      UC Treatments / Results  Labs (  all labs  ordered are listed, but only abnormal results are displayed) Labs Reviewed - No data to display  EKG   Radiology No results found.  Procedures Procedures (including critical care time)  Medications Ordered in UC Medications  ipratropium-albuterol (DUONEB) 0.5-2.5 (3) MG/3ML nebulizer solution 3 mL (3 mLs Nebulization Given 12/11/21 0956)    Initial Impression / Assessment and Plan / UC Course  I have reviewed the triage vital signs and the nursing notes.  Pertinent labs & imaging results that were available during my care of the patient were reviewed by me and considered in my medical decision making (see chart for details).  Well appearing but reports increased effort to breathe. DuoNeb given with great improvement in symptoms. Sent tessalon to try for cough, recommend continue mucinex and Flonase. Discussed that cough can linger for a week or two. Return precautions discussed. Patient agrees to plan  Final Clinical Impressions(s) / UC Diagnoses   Final diagnoses:  Viral URI with cough  Mild asthma without complication, unspecified whether persistent     Discharge Instructions      I recommend continuing symptomatic care at home.  You can use the cough medicine up to three times daily as needed. If this medicine makes you drowsy, take only at night.  I have refilled your inhaler to use every 6 hours as needed. Please follow up with your primary care provider.    ED Prescriptions     Medication Sig Dispense Auth. Provider   albuterol (VENTOLIN HFA) 108 (90 Base) MCG/ACT inhaler Inhale 1 puff into the lungs every 6 (six) hours as needed for wheezing or shortness of breath. 8 g Jaeleah Smyser, PA-C   benzonatate (TESSALON) 100 MG capsule Take 1 capsule (100 mg total) by mouth every 8 (eight) hours as needed for up to 5 days for cough. 15 capsule Cynde Menard, Lurena Joiner, PA-C      PDMP not reviewed this encounter.   Malynn Lucy, Lurena Joiner, [place] 12/11/21 1053

## 2021-12-17 ENCOUNTER — Ambulatory Visit (HOSPITAL_COMMUNITY)
Admission: EM | Admit: 2021-12-17 | Discharge: 2021-12-17 | Disposition: A | Discharge status: Home / Self Care | Payer: Medicaid Other | Attending: Internal Medicine | Admitting: Internal Medicine

## 2021-12-17 ENCOUNTER — Emergency Department (HOSPITAL_COMMUNITY): Payer: Medicaid Other

## 2021-12-17 ENCOUNTER — Inpatient Hospital Stay (HOSPITAL_COMMUNITY)
Admission: EM | Admit: 2021-12-17 | Discharge: 2021-12-19 | DRG: 871 | Disposition: A | Discharge status: Home / Self Care | Payer: Medicaid Other | Attending: Family Medicine | Admitting: Family Medicine

## 2021-12-17 ENCOUNTER — Other Ambulatory Visit: Payer: Self-pay

## 2021-12-17 ENCOUNTER — Encounter (HOSPITAL_COMMUNITY): Payer: Self-pay | Admitting: Emergency Medicine

## 2021-12-17 ENCOUNTER — Ambulatory Visit (INDEPENDENT_AMBULATORY_CARE_PROVIDER_SITE_OTHER): Payer: Medicaid Other

## 2021-12-17 ENCOUNTER — Encounter (HOSPITAL_COMMUNITY): Payer: Self-pay

## 2021-12-17 DIAGNOSIS — R062 Wheezing: Secondary | ICD-10-CM

## 2021-12-17 DIAGNOSIS — J4541 Moderate persistent asthma with (acute) exacerbation: Secondary | ICD-10-CM | POA: Diagnosis not present

## 2021-12-17 DIAGNOSIS — K219 Gastro-esophageal reflux disease without esophagitis: Secondary | ICD-10-CM | POA: Diagnosis present

## 2021-12-17 DIAGNOSIS — R0603 Acute respiratory distress: Secondary | ICD-10-CM | POA: Diagnosis not present

## 2021-12-17 DIAGNOSIS — F17290 Nicotine dependence, other tobacco product, uncomplicated: Secondary | ICD-10-CM | POA: Diagnosis present

## 2021-12-17 DIAGNOSIS — Z79899 Other long term (current) drug therapy: Secondary | ICD-10-CM

## 2021-12-17 DIAGNOSIS — Z7951 Long term (current) use of inhaled steroids: Secondary | ICD-10-CM | POA: Diagnosis not present

## 2021-12-17 DIAGNOSIS — J9601 Acute respiratory failure with hypoxia: Secondary | ICD-10-CM | POA: Diagnosis not present

## 2021-12-17 DIAGNOSIS — Z20822 Contact with and (suspected) exposure to covid-19: Secondary | ICD-10-CM | POA: Diagnosis present

## 2021-12-17 DIAGNOSIS — J189 Pneumonia, unspecified organism: Secondary | ICD-10-CM | POA: Diagnosis not present

## 2021-12-17 DIAGNOSIS — G4700 Insomnia, unspecified: Secondary | ICD-10-CM | POA: Diagnosis present

## 2021-12-17 DIAGNOSIS — E785 Hyperlipidemia, unspecified: Secondary | ICD-10-CM | POA: Diagnosis present

## 2021-12-17 DIAGNOSIS — R0902 Hypoxemia: Secondary | ICD-10-CM | POA: Diagnosis present

## 2021-12-17 DIAGNOSIS — Z823 Family history of stroke: Secondary | ICD-10-CM

## 2021-12-17 DIAGNOSIS — R051 Acute cough: Secondary | ICD-10-CM

## 2021-12-17 DIAGNOSIS — R0602 Shortness of breath: Secondary | ICD-10-CM

## 2021-12-17 DIAGNOSIS — R059 Cough, unspecified: Secondary | ICD-10-CM

## 2021-12-17 DIAGNOSIS — A419 Sepsis, unspecified organism: Principal | ICD-10-CM | POA: Diagnosis present

## 2021-12-17 DIAGNOSIS — Z8249 Family history of ischemic heart disease and other diseases of the circulatory system: Secondary | ICD-10-CM

## 2021-12-17 DIAGNOSIS — Z9049 Acquired absence of other specified parts of digestive tract: Secondary | ICD-10-CM

## 2021-12-17 DIAGNOSIS — J45901 Unspecified asthma with (acute) exacerbation: Secondary | ICD-10-CM | POA: Diagnosis present

## 2021-12-17 DIAGNOSIS — R519 Headache, unspecified: Secondary | ICD-10-CM | POA: Diagnosis present

## 2021-12-17 DIAGNOSIS — Z720 Tobacco use: Secondary | ICD-10-CM

## 2021-12-17 LAB — CBC WITH DIFFERENTIAL/PLATELET
Abs Immature Granulocytes: 0.06 10*3/uL (ref 0.00–0.07)
Basophils Absolute: 0 10*3/uL (ref 0.0–0.1)
Basophils Relative: 0 %
Eosinophils Absolute: 0 10*3/uL (ref 0.0–0.5)
Eosinophils Relative: 0 %
HCT: 40.7 % (ref 36.0–46.0)
Hemoglobin: 13 g/dL (ref 12.0–15.0)
Immature Granulocytes: 0 %
Lymphocytes Relative: 4 %
Lymphs Abs: 0.6 10*3/uL — ABNORMAL LOW (ref 0.7–4.0)
MCH: 25.7 pg — ABNORMAL LOW (ref 26.0–34.0)
MCHC: 31.9 g/dL (ref 30.0–36.0)
MCV: 80.6 fL (ref 80.0–100.0)
Monocytes Absolute: 0.1 10*3/uL (ref 0.1–1.0)
Monocytes Relative: 0 %
Neutro Abs: 13.2 10*3/uL — ABNORMAL HIGH (ref 1.7–7.7)
Neutrophils Relative %: 96 %
Platelets: 290 10*3/uL (ref 150–400)
RBC: 5.05 MIL/uL (ref 3.87–5.11)
RDW: 14.6 % (ref 11.5–15.5)
WBC: 14 10*3/uL — ABNORMAL HIGH (ref 4.0–10.5)
nRBC: 0 % (ref 0.0–0.2)

## 2021-12-17 LAB — POC URINE PREG, ED: Preg Test, Ur: NEGATIVE

## 2021-12-17 LAB — HEPATIC FUNCTION PANEL
ALT: 46 U/L — ABNORMAL HIGH (ref 0–44)
AST: 22 U/L (ref 15–41)
Albumin: 3.8 g/dL (ref 3.5–5.0)
Alkaline Phosphatase: 85 U/L (ref 38–126)
Bilirubin, Direct: <0.1 mg/dL (ref 0.0–0.2)
Total Bilirubin: 0.5 mg/dL (ref 0.3–1.2)
Total Protein: 8.4 g/dL — ABNORMAL HIGH (ref 6.5–8.1)

## 2021-12-17 LAB — I-STAT VENOUS BLOOD GAS, ED
Acid-base deficit: 3 mmol/L — ABNORMAL HIGH (ref 0.0–2.0)
Bicarbonate: 21.1 mmol/L (ref 20.0–28.0)
Calcium, Ion: 1.19 mmol/L (ref 1.15–1.40)
HCT: 41 % (ref 36.0–46.0)
Hemoglobin: 13.9 g/dL (ref 12.0–15.0)
O2 Saturation: 100 %
Potassium: 3.7 mmol/L (ref 3.5–5.1)
Sodium: 138 mmol/L (ref 135–145)
TCO2: 22 mmol/L (ref 22–32)
pCO2, Ven: 33.1 mmHg — ABNORMAL LOW (ref 44–60)
pH, Ven: 7.412 (ref 7.25–7.43)
pO2, Ven: 202 mmHg — ABNORMAL HIGH (ref 32–45)

## 2021-12-17 LAB — BASIC METABOLIC PANEL
Anion gap: 14 (ref 5–15)
BUN: 5 mg/dL — ABNORMAL LOW (ref 6–20)
CO2: 18 mmol/L — ABNORMAL LOW (ref 22–32)
Calcium: 9.6 mg/dL (ref 8.9–10.3)
Chloride: 106 mmol/L (ref 98–111)
Creatinine, Ser: 0.75 mg/dL (ref 0.44–1.00)
GFR, Estimated: >60 mL/min (ref >60)
Glucose, Bld: 180 mg/dL — ABNORMAL HIGH (ref 70–99)
Potassium: 3.7 mmol/L (ref 3.5–5.1)
Sodium: 138 mmol/L (ref 135–145)

## 2021-12-17 LAB — TROPONIN I (HIGH SENSITIVITY)
Troponin I (High Sensitivity): 3 ng/L (ref <18)
Troponin I (High Sensitivity): 5 ng/L (ref <18)

## 2021-12-17 LAB — D-DIMER, QUANTITATIVE: D-Dimer, Quant: 0.65 ug/mL-FEU — ABNORMAL HIGH (ref 0.00–0.50)

## 2021-12-17 LAB — LIPASE, BLOOD: Lipase: 24 U/L (ref 11–51)

## 2021-12-17 LAB — SARS CORONAVIRUS 2 BY RT PCR: SARS Coronavirus 2 by RT PCR: NEGATIVE

## 2021-12-17 MED ORDER — SODIUM CHLORIDE 0.9 % IV SOLN
500.0000 mg | Freq: Once | INTRAVENOUS | Status: AC
  Administered 2021-12-17: 500 mg via INTRAVENOUS
  Filled 2021-12-17: qty 5

## 2021-12-17 MED ORDER — IPRATROPIUM-ALBUTEROL 0.5-2.5 (3) MG/3ML IN SOLN
3.0000 mL | Freq: Once | RESPIRATORY_TRACT | Status: AC
Start: 2021-12-17 — End: 2021-12-17
  Administered 2021-12-17: 3 mL via RESPIRATORY_TRACT

## 2021-12-17 MED ORDER — BENZONATATE 100 MG PO CAPS
100.0000 mg | ORAL_CAPSULE | Freq: Once | ORAL | Status: AC
  Administered 2021-12-17: 100 mg via ORAL
  Filled 2021-12-17: qty 1

## 2021-12-17 MED ORDER — ACETAMINOPHEN 325 MG PO TABS: 650.0000 mg | ORAL_TABLET | Freq: Four times a day (QID) | ORAL | Status: DC | PRN

## 2021-12-17 MED ORDER — IOHEXOL 350 MG/ML SOLN
80.0000 mL | Freq: Once | INTRAVENOUS | Status: AC | PRN
  Administered 2021-12-17: 80 mL via INTRAVENOUS

## 2021-12-17 MED ORDER — FENTANYL CITRATE PF 50 MCG/ML IJ SOSY
50.0000 ug | PREFILLED_SYRINGE | Freq: Once | INTRAMUSCULAR | Status: AC
  Administered 2021-12-17: 50 ug via INTRAVENOUS
  Filled 2021-12-17: qty 1

## 2021-12-17 MED ORDER — IPRATROPIUM-ALBUTEROL 0.5-2.5 (3) MG/3ML IN SOLN
3.0000 mL | RESPIRATORY_TRACT | Status: DC | PRN
  Administered 2021-12-18: 3 mL via RESPIRATORY_TRACT
  Filled 2021-12-17: qty 3

## 2021-12-17 MED ORDER — METHYLPREDNISOLONE SODIUM SUCC 125 MG IJ SOLR
80.0000 mg | Freq: Once | INTRAMUSCULAR | Status: AC
  Administered 2021-12-17: 80 mg via INTRAMUSCULAR

## 2021-12-17 MED ORDER — IPRATROPIUM-ALBUTEROL 0.5-2.5 (3) MG/3ML IN SOLN
RESPIRATORY_TRACT | Status: AC
  Filled 2021-12-17: qty 3

## 2021-12-17 MED ORDER — ENOXAPARIN SODIUM 40 MG/0.4ML IJ SOSY
40.0000 mg | PREFILLED_SYRINGE | INTRAMUSCULAR | Status: DC
  Administered 2021-12-17: 40 mg via SUBCUTANEOUS
  Filled 2021-12-17 (×2): qty 0.4

## 2021-12-17 MED ORDER — SODIUM CHLORIDE 0.9 % IV SOLN
1.0000 g | Freq: Once | INTRAVENOUS | Status: AC
  Administered 2021-12-17: 1 g via INTRAVENOUS
  Filled 2021-12-17: qty 10

## 2021-12-17 MED ORDER — SODIUM CHLORIDE 0.9 % IV SOLN: 500.0000 mg | Freq: Once | INTRAVENOUS | Status: DC

## 2021-12-17 MED ORDER — SODIUM CHLORIDE 0.9 % IV SOLN: 2.0000 g | Freq: Once | INTRAVENOUS | Status: DC

## 2021-12-17 MED ORDER — SODIUM CHLORIDE 0.9 % IV SOLN
500.0000 mg | INTRAVENOUS | Status: DC
  Administered 2021-12-18: 500 mg via INTRAVENOUS
  Filled 2021-12-17: qty 5

## 2021-12-17 MED ORDER — METHYLPREDNISOLONE SODIUM SUCC 125 MG IJ SOLR
INTRAMUSCULAR | Status: AC
  Filled 2021-12-17: qty 2

## 2021-12-17 NOTE — ED Triage Notes (Signed)
Patient states she was sent to ED from urgent care for evaluation of asthma exacerbation that started two days ago. Patient states she received 2x breathing treatments and IM solumedrol. Patient speaking in complet sentences, is alert, oriented ,and in no apparent distress at this time.

## 2021-12-17 NOTE — ED Triage Notes (Signed)
EMS stated, here 4 days ago for the same for viral upper respiratory. Wheezing both.

## 2021-12-17 NOTE — ED Provider Triage Note (Signed)
Emergency Medicine Provider Triage Evaluation Note  Gloria Cox , a 26 y.o. female  was evaluated in triage.  Pt complains of sob. Progressive worsening sob and cough with streaks of blood x 1 week.  Increase wheezing.  Seen at [facility] today and found to be hypoxic.  Received albuterol treatment with solumedrol withminimal relief.  No hx of PE.    Review of Systems  Positive: As above Negative: As above  Physical Exam  BP 122/82 (BP Location: Right Arm)   Pulse 94   Temp 98 F (36.7 C) (Oral)   Resp 20   LMP 12/05/2021   SpO2 98%  Gen:   Awake, no distress   Resp:  Normal effort  MSK:   Moves extremities without difficulty  Other:    Medical Decision Making  Medically screening exam initiated at 12:53 PM.  Appropriate orders placed.  TAMORA HUNEKE was informed that the remainder of the evaluation will be completed by another provider, this initial triage assessment does not replace that evaluation, and the importance of remaining in the ED until their evaluation is complete.  Wearing supplemental O2, is tachypneic.   Domenic Moras, PA-C 12/17/21 1257

## 2021-12-17 NOTE — ED Provider Notes (Signed)
[facility] EMERGENCY DEPARTMENT Provider Note   CSN: [id number] Arrival date & time: 12/17/21  1046     History  Chief Complaint  Patient presents with   Asthma   Shortness of Breath    Gloria Cox is a 26 y.o. female.  The history is provided by the patient. No language interpreter was used.  Asthma This is a recurrent problem. The problem occurs constantly. The problem has been gradually worsening. Associated symptoms include chest pain and shortness of breath. Pertinent negatives include no abdominal pain and no headaches. Nothing aggravates the symptoms. Nothing relieves the symptoms.  Shortness of Breath Severity:  Moderate Onset quality:  Gradual Duration:  3 days Timing:  Constant Progression:  Worsening Context: URI   Relieved by:  Nothing Worsened by:  Deep breathing and coughing Ineffective treatments:  None tried Associated symptoms: chest pain, cough, hemoptysis, sputum production and wheezing   Associated symptoms: no abdominal pain, no fever, no headaches, no neck pain, no rash and no vomiting   Risk factors: no hx of PE/DVT        Home Medications Prior to Admission medications   Medication Sig Start Date End Date Taking? Authorizing Provider  acetaminophen (TYLENOL) 500 MG tablet Take 2 tablets (1,000 mg total) by mouth every 6 (six) hours. 09/09/21   Warner Mccreedy, MD  albuterol (VENTOLIN HFA) 108 (90 Base) MCG/ACT inhaler Inhale 1 puff into the lungs every 6 (six) hours as needed for wheezing or shortness of breath. 12/11/21   Rising, Lurena Joiner, PA-C  furosemide (LASIX) 20 MG tablet Take 1 tablet (20 mg total) by mouth daily for 3 days. 09/09/21 09/12/21  Warner Mccreedy, MD  ibuprofen (ADVIL) 600 MG tablet Take 1 tablet (600 mg total) by mouth every 6 (six) hours. 09/09/21   Warner Mccreedy, MD  Norethindrone Acetate-Ethinyl Estrad-FE (LOESTRIN 24 FE) 1-20 MG-MCG(24) tablet Take 1 tablet by mouth daily. 10/16/21   Constant, Peggy, MD  budesonide  (PULMICORT) 180 MCG/ACT inhaler Inhale 2 puffs into the lungs as needed.  06/14/11  [provider]      Allergies    Patient has no known allergies.    Review of Systems   Review of Systems  Constitutional:  Negative for chills, fatigue and fever.  HENT:  Negative for congestion.   Respiratory:  Positive for cough, hemoptysis, sputum production, shortness of breath and wheezing. Negative for chest tightness.   Cardiovascular:  Positive for chest pain. Negative for palpitations.  Gastrointestinal:  Negative for abdominal pain, constipation, diarrhea, nausea and vomiting.  Genitourinary:  Negative for dysuria, flank pain and frequency.  Musculoskeletal:  Negative for back pain, neck pain and neck stiffness.  Skin:  Negative for rash and wound.  Neurological:  Negative for dizziness, weakness, light-headedness, numbness and headaches.  Psychiatric/Behavioral:  Negative for agitation and confusion.   All other systems reviewed and are negative.   Physical Exam Updated Vital Signs BP 133/84   Pulse 91   Temp 98 F (36.7 C) (Oral)   Resp (!) 22   LMP 12/05/2021   SpO2 95%  Physical Exam Vitals and nursing note reviewed.  Constitutional:      General: She is not in acute distress.    Appearance: She is well-developed. She is not ill-appearing, toxic-appearing or diaphoretic.  HENT:     Head: Normocephalic and atraumatic.  Eyes:     Extraocular Movements: Extraocular movements intact.     Conjunctiva/sclera: Conjunctivae normal.     Pupils: Pupils  are equal, round, and reactive to light.  Cardiovascular:     Rate and Rhythm: Normal rate and regular rhythm.     Heart sounds: No murmur heard. Pulmonary:     Effort: Pulmonary effort is normal. Tachypnea present. No respiratory distress.     Breath sounds: Wheezing present. No rales.  Chest:     Chest wall: No tenderness.  Abdominal:     Palpations: Abdomen is soft.     Tenderness: There is no abdominal tenderness.   Musculoskeletal:        General: No swelling.     Cervical back: Neck supple.     Right lower leg: No tenderness. No edema.     Left lower leg: No tenderness. No edema.  Skin:    General: Skin is warm and dry.     Capillary Refill: Capillary refill takes less than 2 seconds.     Findings: No erythema.  Neurological:     General: No focal deficit present.     Mental Status: She is alert.  Psychiatric:        Mood and Affect: Mood normal.     ED Results / Procedures / Treatments   Labs (all labs ordered are listed, but only abnormal results are displayed) Labs Reviewed  BASIC METABOLIC PANEL - Abnormal; Notable for the following components:      Result Value   CO2 18 (*)    Glucose, Bld 180 (*)    BUN 5 (*)    All other components within normal limits  CBC WITH DIFFERENTIAL/PLATELET - Abnormal; Notable for the following components:   WBC 14.0 (*)    MCH 25.7 (*)    Neutro Abs 13.2 (*)    Lymphs Abs 0.6 (*)    All other components within normal limits  D-DIMER, QUANTITATIVE - Abnormal; Notable for the following components:   D-Dimer, Quant 0.65 (*)    All other components within normal limits  HEPATIC FUNCTION PANEL - Abnormal; Notable for the following components:   Total Protein 8.4 (*)    ALT 46 (*)    All other components within normal limits  I-STAT VENOUS BLOOD GAS, ED - Abnormal; Notable for the following components:   pCO2, Ven 33.1 (*)    pO2, Ven 202 (*)    Acid-base deficit 3.0 (*)    All other components within normal limits  SARS CORONAVIRUS 2 BY RT PCR  LIPASE, BLOOD  POC URINE PREG, ED  TROPONIN I (HIGH SENSITIVITY)  TROPONIN I (HIGH SENSITIVITY)    EKG EKG Interpretation  Date/Time:  Wednesday December 17 2021 12:52:00 EDT Ventricular Rate:  99 PR Interval:  140 QRS Duration: 86 QT Interval:  348 QTC Calculation: 446 R Axis:   84 Text Interpretation: Normal sinus rhythm Nonspecific ST abnormality Abnormal ECG When compared with ECG of  17-Dec-2021 10:04, PREVIOUS ECG IS PRESENT when compared to prior, similar appearance. No STEMI Confirmed by Antony Blackbird [phone]) on 12/17/2021 4:11:53 PM  Radiology CT Angio Chest PE W and/or Wo Contrast  Result Date: 12/17/2021 CLINICAL DATA:  Worsening short of breath and cough, hemoptysis, wheezing, hypoxia EXAM: CT ANGIOGRAPHY CHEST WITH CONTRAST TECHNIQUE: Multidetector CT imaging of the chest was performed using the standard protocol during bolus administration of intravenous contrast. Multiplanar CT image reconstructions and MIPs were obtained to evaluate the vascular anatomy. RADIATION DOSE REDUCTION: This exam was performed according to the departmental dose-optimization program which includes automated exposure control, adjustment of the mA and/or kV according to  patient size and/or use of iterative reconstruction technique. CONTRAST:  80mL OMNIPAQUE IOHEXOL 350 MG/ML SOLN COMPARISON:  12/17/2021 FINDINGS: Cardiovascular: This is a technically adequate evaluation of the pulmonary vasculature. No filling defects or pulmonary emboli. The heart is unremarkable without pericardial effusion. No evidence of thoracic aortic aneurysm or dissection. Mediastinum/Nodes: No enlarged mediastinal, hilar, or axillary lymph nodes. Thyroid gland, trachea, and esophagus demonstrate no significant findings. Lungs/Pleura: Scattered ground-glass opacities are seen throughout the lungs, most pronounced within the right middle and right upper lobe. This could reflect sequela of hemorrhage or infection such as COVID. No effusion or pneumothorax. There are scattered areas of bronchial wall thickening and opacification, primarily at the distal segmental and subsegmental levels, which may reflect infection or reactive airway disease. Upper Abdomen: No acute abnormality. Musculoskeletal: No acute or destructive bony lesions. Reconstructed images demonstrate no additional findings. Review of the MIP images confirms the above  findings. IMPRESSION: 1. Scattered bilateral bronchial wall thickening and areas of mucoid impaction, with superimposed scattered ground-glass airspace disease as above. Findings favor to be related to multifocal pneumonia, including viral etiologies such as COVID. 2. No evidence of pulmonary embolus. Electronically Signed   By: Sharlet SalinaMichael  Brown M.D.   On: 12/17/2021 20:06   DG Chest 2 View  Result Date: 12/17/2021 CLINICAL DATA:  Cough and shortness of breath.  Significant wheeze. EXAM: CHEST - 2 VIEW COMPARISON:  None Available. FINDINGS: The heart size and mediastinal contours are within normal limits. Both lungs are clear. The visualized skeletal structures are unremarkable. IMPRESSION: No focal consolidation or pleural effusion. Electronically Signed   By: Larose HiresImran  Ahmed D.O.   On: 12/17/2021 09:22    Procedures Procedures    Medications Ordered in ED Medications  cefTRIAXone (ROCEPHIN) 1 g in sodium chloride 0.9 % 100 mL IVPB (has no administration in time range)  azithromycin (ZITHROMAX) 500 mg in sodium chloride 0.9 % 250 mL IVPB (has no administration in time range)  fentaNYL (SUBLIMAZE) injection 50 mcg (has no administration in time range)  fentaNYL (SUBLIMAZE) injection 50 mcg (50 mcg Intravenous Given 12/17/21 1843)  iohexol (OMNIPAQUE) 350 MG/ML injection 80 mL (80 mLs Intravenous Contrast Given 12/17/21 1945)    ED Course/ Medical Decision Making/ A&P                           Medical Decision Making Amount and/or Complexity of Data Reviewed Labs: ordered. Radiology: ordered.  Risk Prescription drug management.    Jeanett SchleinMiranda J Westervelt is a 26 y.o. female with a past medical history significant for asthma, hypothyroidism, previous gestational hypertension, and migraines who presents with chest pain, shortness of breath, cough, and hemoptysis.  According to patient, for the last week she has been having URI symptoms but the last few days it has worsened.  She said that she went to  another facility today where she was found to be hypoxic.  She was given albuterol and steroids but need to be placed on oxygen.  She is currently on 3 L to maintain oxygen saturations.  She has had a lot several days she has developed a pleuritic bandlike chest discomfort around her chest as well as some hemoptysis.  She reports she is on oral birth control medication but denies any history of DVT or PE.  She denies any leg pain, leg swelling, or recent travel.  She is currently not breast-feeding her 6174-month-old.  She denies trauma and denies any rashes to suggest shingles.  She reports the symptoms are extremely pruritic and has been very severe.  She reports the pain is currently minimal now that she is on oxygen.  On exam, lungs did have some wheezing but there were not significant rhonchi or rales.  Chest palpation did not reproduce her discomfort.  Abdomen was slightly tender superiorly but she says that she is already had a cholecystectomy.  Bowel sounds were appreciated.  Legs were nontender and nonedematous.  Good pulses in the rest of her extremities.  Patient now resting more comfortably on 3 L nasal cannula to maintain oxygen saturations in the mid 90s.  Given the patient's pleuritic symptoms, hemoptysis, birth control use, and tachypnea/proxy, patient had a D-dimer ordered that was positive.  We will get CT PE study.  She will have other labs.  Anticipate admission either for asthma exacerbation versus pulmonary embolism causing new hypoxia.  8:30 PM CT scan returned and does not show evidence of pulmonary embolism but does show multifocal pneumonia.  Also look somewhat viral so we will add a repeat COVID test on for her.  We will give antibiotics given her new oxygen requirement and her troponins were negative x2.  Called medicine and they will admit for further management of new hypoxia in the setting of pneumonia.        Final Clinical Impression(s) / ED Diagnoses Final  diagnoses:  Multifocal pneumonia  Hypoxia     Clinical Impression: 1. Multifocal pneumonia   2. Hypoxia     Disposition: Admit  This note was prepared with assistance of Dragon voice recognition software. Occasional wrong-word or sound-a-like substitutions may have occurred due to the inherent limitations of voice recognition software.       Denika Krone, Canary Brim, MD 12/17/21 2049

## 2021-12-17 NOTE — Hospital Course (Addendum)
Gloria Cox is a 26 year old female presenting with SOB and wheezing in the setting of recent URI. PMHx includes asthma,migraine, HLD and GERD.  Acute respiratory failure with hypoxia Multifocal PNA Asthma exacerbation Patient with worsening SOB and wheezing not relieved by albuterol and steroid therapy. She has history of asthma, diagnosed at 53 and only controlled with PRN albuterol. Upon admission patient met sepsis criteria due to tachycardia, tachypnea and leukocytosis. Patient initially required 3L [place] supplemental oxygen which was increased to 4L to maintain oxygen saturation. Imaging confirmed multifocal PNA and patient was started in Ceftriaxone and Azithromycin for CAP coverage and transitioned to Cefdinir and Azithromycin to complete 5 day course (9/20-9/4). COVID, flu, HIV negative. She was also started on scheduled duonebs with improvement in breathing. She was started on steroid course. She was started on PRN dulera for mild asthma.   Insomnia Scheduled melatonin for sleep.   PCP follow-up: 1) Asthma management, started dulara  2) Continue to discuss vaping cessation 3) check A1C outpatient  4) follow up for birth control, patient should use back up method for one week.

## 2021-12-17 NOTE — ED Notes (Signed)
Pt's SpO2 between 87-92% on 3L O2, RN increased O2 to 4L.

## 2021-12-17 NOTE — Assessment & Plan Note (Addendum)
H/o asthma controlled with PRN albuterol with worsening symptoms in the past 4 days. Multifocal PNA superimposed on asthma exacerbation. Unknown cause of multifocal severe PNA in a 26yo female. Flu negative. Rule out immunosuppression with HIV. Breathing well on RA. Ambulate with pulse ox > 92%.  -Monitor oxygen saturation (O2>92%) and respiratory status -Transition Ceftriaxone and Azithromycin for CAP coverage to PO cefdinir and azithromycin (9/20-9/24) -scheduled Duonebs increased to q6 given resolving wheezing and coughing -started steroid course x 5 days (9/22-9/26)  -PRN tessalon pearls  -wean O2 as appropriate  -PRN symbicort

## 2021-12-17 NOTE — ED Notes (Signed)
Report given to [place], CN at [facility].

## 2021-12-17 NOTE — Progress Notes (Signed)
New Admission Note:  Arrival Method: Via stretcher from ED to 31m09 Mental Orientation: Alert & Oriented x4 Telemetry: CCMD verified. [id number] Assessment: Completed Skin: Refer to flowsheet IV: Right AC Pain: 06/10 Safety Measures: Safety Fall Prevention Plan discussed with patient. Admission: Completed 5 [place] Orientation: Patient has been orientated to the room, unit and the staff.  Orders have been reviewed and are being implemented. Will continue to monitor the patient. Call light has been placed within reach.   Vassie Moselle, RN  Phone Number: [phone]

## 2021-12-17 NOTE — Assessment & Plan Note (Addendum)
Currently vapes nicotine x 5-6 years. Former smoker, 1-pack year history. She reports wanting to quit after this hospitalization -Counseled patient on tobacco cessation

## 2021-12-17 NOTE — H&P (Cosign Needed)
Hospital Admission History and Physical Service Pager: [phone]  Patient name: Gloria Cox Medical record number: [id number] Date of Birth: [birth year] Age: 26 y.o. Gender: female  Primary Care Provider: Patient, No Pcp Per Consultants: None Code Status: Full code Preferred Emergency Contact:  Contact Information     Name Relation Home Work [place] Mother [phone]  [phone]   Audie Box [phone]  [phone]        Chief Complaint: SOB, wheezing  Assessment and Plan: CHEN HOLZMAN is a 26 y.o. female presenting with SOB and wheezing in the setting of recent URI. Differential for this patient's presentation of this includes PNA (confirmed by CT findings) vs asthma exacerbation (diffuse wheezing on exam with h/o asthma). Less likely differential includes PE (negative CTA).  * Acute respiratory failure with hypoxia (HCC) Patient was productive cough x 2 weeks that progressed to worsening SOB and wheezing two days ago. Was seen at Urgent Care today and received albuterol and steroids but was persistent hypoxic to ~80% oxygen saturation. In the ED patient initially placed on 3L/min [place] due to increase WOB and was increased to 4L/min for comfort. Patient meeting sepsis criteria upon admission including tachycardia, tachypnea and leukocytosis with imaging confirmed multifocal pneumonia. Afebrile.H/o asthma with worsening symptoms in the past few months. CXR negative for acute process. CTA notable for scattered bilateral bronchial wall thickening and areas of mucoid impaction, with superimposed scattered ground-glass airspace disease - favor multifocal pneumonia. Negative for PE. Repeat COVID testing negative.  -Admitted to [facility], Dr. Manson Passey attending -Monitor oxygen saturation (O2>92%) and respiratory status -Started on Ceftriaxone and Azithromycin for CAP coverage.  -Consider broad spectrum abx coverage if worsening respiratory  status. -Duonebs q4 prn wheezing for possible worsening asthma in the setting of multifocal pneumonia  -pending BNP although low concern for CHF given CXR and euvolemic status  -am labs  -continuous cardiac monitoring -Vital per protocol  Tobacco use Currently vapes nicotine x 5-6 years. Former smoker, 1-pack year history. -Counseled patient on tobacco cessation and impact on respiratory status to prevent worsening and possible progression to COPD       FEN/GI: Normal diet VTE Prophylaxis: Lovenox 40mg   Disposition: Admitted to [facility]MTS, attending Dr.   History of Present Illness:  Gloria Cox is a 26 y.o. female presenting with worsening SOB and wheezing. States her symptoms started two weeks ago with a productive cough and runny nose. States her sx did not improve after a week so she went to urgent care, received a a breathing treatment and was sent home. Patient states she still was not improving and two days ago started to experience worsening shortness of breath. She tried to take a hot shower last night and tried to use a humidifier which made her feel worse. Used albuterol which did not help and did not sleep the last two nights due to coughing. She then went to the urgent care again today where she was given an albuterol treatment and steroid. Her oxygen level was persistently in the 80s and she was directed to the ED. Denies sick contacts. Denies fever.  Currently, patient complains of forceful, productive cough productive of green sputum and one episode of bloody sputum. States her chest hurts from all the coughing. Endorses myalgias, diarrhea (for about a week), decreased appetite but still able to drink fluids and headache with increased cough intensity.   Patient relates a history of childhood asthma that had resolved until she develop symptoms  after her pregnancy a few months ago. Has been using her albuterol more over the past few days with even further increased  frequency over the past few days. Denies any breathing treatments for asthma maintenance.  She then went to the urgent care again today where she was given an albuterol treatment and steroid.   In the ED, patient put on 3L of oxygen with some improvement in SOB. Increased to 4L over time due to persistent symptoms. Imaging confirm PNA, patient started on Ceftriaxone and Azithromycin for CAP coverage.  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   Pertinent Past Surgical History: Cholecystectomy C-section Remainder reviewed in history tab.   Pertinent Social History: Tobacco use: Vaping nicotine x 5-6 years, former smoker for 2 years, 1/2 pack per day Alcohol use: Occasionally Other Substance use: None recently Lives with significant other, baby, mother, grandmother  Pertinent Family History: Father: HTN, stroke  Remainder reviewed in history tab.   Important Outpatient Medications: Albuterol Loestrin Remainder reviewed in medication history.   Objective: BP 133/75 (BP Location: Left Arm)   Pulse (!) 102   Temp 97.9 F (36.6 C) (Oral)   Resp 18   LMP 12/05/2021   SpO2 93%   Exam: General: Alert. NAD Eyes: Anicertic sclera. ENTM: Mildly erythematous throat. No rhinorrhea. Neck: Supple, non-tender. No cervical lymphadenopathy Cardiovascular: RRR without murmur, rub or gallop Respiratory: Mildly short of breath with speech. Normal work of breathing on 4L/min [place]. Diffuse moderate expiratory wheezing. Mildly decreased breath sounds at lung bases. No rales or crackles. No focal findings.  Gastrointestinal: Soft. Tender to palpation over LUQ around ribs MSK: No peripheral edema, distal pulses strong and equal bilaterally  Derm: Warm, dry. Neuro: Motor and sensation intact globally. Psych: Cooperative. Pleasant. Tearful when discussing hospital stay  due to being away from her newborn.  Labs:  CBC BMET  Recent Labs  Lab 12/18/21 0219  WBC 17.7*  HGB 12.5  HCT 39.4  PLT 298   Recent Labs  Lab 12/18/21 0219  NA 138  K 4.1  CL 106  CO2 22  BUN 6  CREATININE 0.67  GLUCOSE 134*  CALCIUM 9.7    Pertinent additional labs: Troponin: 3>5 COVID neg  EKG: Sinus tachycardia.   Imaging Studies Performed: CT Angio Chest PE W and/or Wo Contrast Result Date: 12/17/2021 IMPRESSION: 1. Scattered bilateral bronchial wall thickening and areas of mucoid impaction, with superimposed scattered ground-glass airspace disease as above. Findings favor to be related to multifocal pneumonia, including viral etiologies such as COVID. 2. No evidence of pulmonary embolus. Electronically Signed   By: Randa Ngo M.D.   On: 12/17/2021 20:06   DG Chest 2 View Result Date: 12/17/2021 IMPRESSION: No focal consolidation or pleural effusion. Electronically Signed   By: Keane Police D.O.   On: 12/17/2021 09:22      Colletta Maryland, MD 12/17/2021, 11:28 PM PGY-1, [place]  I was personally present and performed or re-performed the history, physical exam and medical decision making activities of this service and have verified that the service and findings are accurately documented in the intern's note. My edits are noted within the note above. Please also see attending's attestation.   Donney Dice, DO                  12/18/2021, 4:31 AM  PGY-3, [place]  [place] Intern pager: [phone], text pages welcome Secure chat group [place]

## 2021-12-17 NOTE — ED Triage Notes (Signed)
Pt c/o coughing and spitting up blood for over a week. States was seen and tx'd here on 9/14 with no relief. States now having SOB.

## 2021-12-17 NOTE — ED Notes (Signed)
Patient is being discharged from the Urgent Care and sent to the Emergency Department via [facility] . Per Sharyn Lull, NP, patient is in need of higher level of care due to Asthma Exacerbation. Patient is aware and verbalizes understanding of plan of care.  Vitals:   12/17/21 0825  BP: (!) 138/90  Pulse: 99  Resp: 18  Temp: 98.1 F (36.7 C)  SpO2: 93%

## 2021-12-17 NOTE — ED Notes (Signed)
[facility] at bedside 

## 2021-12-17 NOTE — ED Provider Notes (Addendum)
[place]    CSN: [id number] Arrival date & time: 12/17/21  [id number]      History   Chief Complaint Chief Complaint  Patient presents with   Cough    HPI Gloria Cox is a 26 y.o. female.   Patient presents urgent care for evaluation of shortness of breath and cough that has been ongoing for the last 3 weeks and worsened significantly over the last couple of days.  Patient was seen at urgent care on September 10 and September 14 for same symptoms and diagnosed with viral upper respiratory tract infection.  At her last visit, she was given an albuterol inhaler and Tessalon Perles both of which have not been helping very much with her symptoms.  Cough is productive and she is reporting chills at home without known fever.  She is an asthmatic at baseline and only uses albuterol inhaler as needed for this.  Reports significant wheezing cough that is worse at nighttime and chest pain associated with deep breathing and coughing.  She reports bloody sputum over the last couple of days after coughing but attributes this to dry air and frequent coughing.  Denies dizziness, nasal congestion, headache, nausea, vomiting, abdominal pain, known sick contacts, and heart palpitations.  States she has been using her albuterol inhaler very frequently over the last couple of days without relief of cough or shortness of breath.  She has never been hospitalized related to asthma in the past and was diagnosed at age 87.  She began smoking cigarettes at age 77 then quit smoking cigarettes at age 75 and picked up vaping.  She has been vaping for the last 4 years every day multiple times throughout the day.  Denies history of cardiac problems.    Cough   Past Medical History:  Diagnosis Date   Asthma    Dyspepsia    Goiter    Hypothyroidism    [place]    Pre-diabetes    Pre-diabetes    [place]    Thyroiditis, autoimmune     Patient Active Problem List   Diagnosis Date Noted   S/P cesarean  section 09/06/2021   Gestational hypertension 09/06/2021   Post term pregnancy over 40 weeks 09/05/2021   Obesity in pregnancy 02/16/2021   History of asthma 02/16/2021   Alcohol use affecting pregnancy in first trimester 02/16/2021   Tobacco use affecting pregnancy in first trimester, antepartum 02/16/2021   Encounter for supervision of normal first pregnancy in third trimester 02/12/2021   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   Migraine without aura and without status migrainosus, not intractable 02/14/2015   Macromastia 02/20/2014   Hyperlipemia 05/09/2013   Dysthymia 08/19/2011    Past Surgical History:  Procedure Laterality Date   CESAREAN SECTION N/A 09/06/2021   Procedure: CESAREAN SECTION;  Surgeon: Aletha Halim, MD;  Location: [facility] ORS;  Service: Obstetrics;  Laterality: N/A;   CHOLECYSTECTOMY N/A 12/18/2015   Procedure: LAPAROSCOPIC CHOLECYSTECTOMY;  Surgeon: Rolm Bookbinder, MD;  Location: [facility] ORS;  Service: General;  Laterality: N/A;   WISDOM TOOTH EXTRACTION  03/30/2012    OB History     Gravida  1   Para  1   Term  1   Preterm      AB      Living  1      SAB      IAB      Ectopic      Multiple  0   Live Births  1            Home Medications    Prior to Admission medications   Medication Sig Start Date End Date Taking? Authorizing Provider  acetaminophen (TYLENOL) 500 MG tablet Take 2 tablets (1,000 mg total) by mouth every 6 (six) hours. 09/09/21   Cox Matter, MD  albuterol (VENTOLIN HFA) 108 (90 Base) MCG/ACT inhaler Inhale 1 puff into the lungs every 6 (six) hours as needed for wheezing or shortness of breath. 12/11/21   Rising, Wells Guiles, PA-C  furosemide (LASIX) 20 MG tablet Take 1 tablet (20 mg total) by mouth daily for 3 days. 09/09/21 09/12/21  Cox Matter, MD  ibuprofen (ADVIL) 600 MG tablet Take 1 tablet (600 mg total) by mouth every 6 (six) hours. 09/09/21   Cox Matter, MD  Norethindrone Acetate-Ethinyl Estrad-FE  (LOESTRIN 24 FE) 1-20 MG-MCG(24) tablet Take 1 tablet by mouth daily. 10/16/21   Constant, Peggy, MD  budesonide (PULMICORT) 180 MCG/ACT inhaler Inhale 2 puffs into the lungs as needed.  06/14/11  [provider]    Family History Family History  Problem Relation Age of Onset   Obesity Mother    Hypertension Father    Stroke Father    Obesity Sister    Obesity Brother    Diabetes Maternal Grandmother    Hypertension Maternal Grandmother    Obesity Maternal Grandmother    Diabetes Maternal Grandfather    Hypertension Maternal Grandfather    Heart Problems Maternal Grandfather 28   Diabetes Paternal Grandmother    Hypertension Paternal Grandmother    Diabetes Paternal Grandfather    Hypertension Paternal Grandfather     Social History Social History   Tobacco Use   Smoking status: Former    Packs/day: 0.10    Types: Cigarettes    Quit date: 03/09/2018    Years since quitting: 3.7   Smokeless tobacco: Never  Vaping Use   Vaping Use: Every day   Start date: 01/07/2021   Substances: Nicotine  Substance Use Topics   Alcohol use: Not Currently   Drug use: Not Currently    Types: Marijuana    Comment: none for years     Allergies   Patient has no known allergies.   Review of Systems Review of Systems  Respiratory:  Positive for cough.   As per HPI   Physical Exam Triage Vital Signs ED Triage Vitals  Enc Vitals Group     BP 12/17/21 0825 (!) 138/90     Pulse Rate 12/17/21 0825 99     Resp 12/17/21 0825 18     Temp 12/17/21 0825 98.1 F (36.7 C)     Temp Source 12/17/21 0825 Oral     SpO2 12/17/21 0825 93 %     Weight --      Height --      Head Circumference --      Peak Flow --      Pain Score 12/17/21 0826 6     Pain Loc --      Pain Edu? --      Excl. in [place]? --    No data found.  Updated Vital Signs BP (!) 138/90 (BP Location: Right Arm)   Pulse 99   Temp 98.1 F (36.7 C) (Oral)   Resp 18   LMP 12/05/2021   SpO2 93%    Breastfeeding No   Visual Acuity Right Eye Distance:   Left Eye Distance:   Bilateral Distance:    Right Eye Near:  Left Eye Near:    Bilateral Near:     Physical Exam Vitals and nursing note reviewed.  Constitutional:      Appearance: She is obese. She is ill-appearing. She is not toxic-appearing.     Comments: Very pleasant patient sitting on exam in position of comfort table in no acute distress.   HENT:     Head: Normocephalic and atraumatic.     Right Ear: Hearing, tympanic membrane, ear canal and external ear normal.     Left Ear: Hearing, tympanic membrane, ear canal and external ear normal.     Nose: Nose normal. No rhinorrhea.     Mouth/Throat:     Lips: Pink.     Mouth: Mucous membranes are moist.     Pharynx: No posterior oropharyngeal erythema.  Eyes:     General: Lids are normal. Vision grossly intact. Gaze aligned appropriately.     Extraocular Movements: Extraocular movements intact.     Conjunctiva/sclera: Conjunctivae normal.     Pupils: Pupils are equal, round, and reactive to light.  Cardiovascular:     Rate and Rhythm: Normal rate and regular rhythm.     Heart sounds: Normal heart sounds, S1 normal and S2 normal.  Pulmonary:     Effort: Respiratory distress present.     Breath sounds: Normal air entry. Wheezing and rhonchi present.     Comments: Increased pulmonary effort with diffuse inspiratory and expiratory wheeze with rhonchi present to all lung fields. Abdominal:     Palpations: Abdomen is soft.  Musculoskeletal:     Cervical back: Neck supple.  Skin:    General: Skin is warm and dry.     Capillary Refill: Capillary refill takes less than 2 seconds.     Findings: No rash.  Neurological:     General: No focal deficit present.     Mental Status: She is alert and oriented to person, place, and time. Mental status is at baseline.     Cranial Nerves: No dysarthria or facial asymmetry.     Gait: Gait is intact.  Psychiatric:        Mood and  Affect: Mood normal.        Speech: Speech normal.        Behavior: Behavior normal.        Thought Content: Thought content normal.        Judgment: Judgment normal.      UC Treatments / Results  Labs (all labs ordered are listed, but only abnormal results are displayed) Labs Reviewed - No data to display  EKG   Radiology DG Chest 2 View  Result Date: 12/17/2021 CLINICAL DATA:  Cough and shortness of breath.  Significant wheeze. EXAM: CHEST - 2 VIEW COMPARISON:  None Available. FINDINGS: The heart size and mediastinal contours are within normal limits. Both lungs are clear. The visualized skeletal structures are unremarkable. IMPRESSION: No focal consolidation or pleural effusion. Electronically Signed   By: Keane Police D.O.   On: 12/17/2021 09:22    Procedures Procedures (including critical care time)  Medications Ordered in UC Medications  ipratropium-albuterol (DUONEB) 0.5-2.5 (3) MG/3ML nebulizer solution 3 mL (3 mLs Nebulization Given 12/17/21 0844)  methylPREDNISolone sodium succinate (SOLU-MEDROL) 125 mg/2 mL injection 80 mg (80 mg Intramuscular Given 12/17/21 0842)  ipratropium-albuterol (DUONEB) 0.5-2.5 (3) MG/3ML nebulizer solution 3 mL (3 mLs Nebulization Given 12/17/21 0924)    Initial Impression / Assessment and Plan / UC Course  I have reviewed the triage vital signs and  the nursing notes.  Pertinent labs & imaging results that were available during my care of the patient were reviewed by me and considered in my medical decision making (see chart for details).  1.  Respiratory distress Patient presents to urgent care with tachypnea and increased work of breathing for the last couple of days that has not responded well to albuterol inhaler at home.  Patient given Solu-Medrol 80 mg injection in the clinic to decrease inflammation.  Given 1 DuoNeb treatment initially at urgent care with slight improvement in cardiopulmonary exam but still very subjectively short of  breath with increased work of breathing.  Second DuoNeb given without much improvement of respiratory exam findings and continued to have significant wheeze with continued increased work of breathing and shortness of breath.  Chest x-ray negative for acute cardiopulmonary finding.  EKG performed due to shortness of breath and chest discomfort shows ventricular rate of 118 and no red flag signs of acute coronary abnormality.   Due to lack of clinical improvement and subjective improvement of symptoms after 2 DuoNeb breathing treatments and 80 mg Solu-Medrol injection, I recommend patient go to the nearest emergency department for further evaluation related to respiratory distress and moderate persistent asthma exacerbation.  Oxygen saturation is 94% on room air with a heart rate of 100 2:21 DuoNeb treatments and patient continues to be tachypneic.  Discussed recommendation to go to the nearest emergency department via [place] and patient and mother verbalized agreement with plan.  Patient discharged from urgent care with [facility] for further evaluation in the ED.  Final Clinical Impressions(s) / UC Diagnoses   Final diagnoses:  Moderate persistent asthma with exacerbation  Acute cough  Respiratory distress   Discharge Instructions   None    ED Prescriptions   None    PDMP not reviewed this encounter.   Talbot Grumbling, FNP 12/17/21 [place], [place], FNP 12/20/21 1926

## 2021-12-18 ENCOUNTER — Encounter (HOSPITAL_COMMUNITY): Payer: Self-pay | Admitting: Family Medicine

## 2021-12-18 DIAGNOSIS — R519 Headache, unspecified: Secondary | ICD-10-CM

## 2021-12-18 DIAGNOSIS — J189 Pneumonia, unspecified organism: Secondary | ICD-10-CM | POA: Diagnosis not present

## 2021-12-18 DIAGNOSIS — G4700 Insomnia, unspecified: Secondary | ICD-10-CM

## 2021-12-18 LAB — CBC
HCT: 39.4 % (ref 36.0–46.0)
Hemoglobin: 12.5 g/dL (ref 12.0–15.0)
MCH: 25.6 pg — ABNORMAL LOW (ref 26.0–34.0)
MCHC: 31.7 g/dL (ref 30.0–36.0)
MCV: 80.7 fL (ref 80.0–100.0)
Platelets: 298 10*3/uL (ref 150–400)
RBC: 4.88 MIL/uL (ref 3.87–5.11)
RDW: 14.6 % (ref 11.5–15.5)
WBC: 17.7 10*3/uL — ABNORMAL HIGH (ref 4.0–10.5)
nRBC: 0 % (ref 0.0–0.2)

## 2021-12-18 LAB — BASIC METABOLIC PANEL
Anion gap: 10 (ref 5–15)
BUN: 6 mg/dL (ref 6–20)
CO2: 22 mmol/L (ref 22–32)
Calcium: 9.7 mg/dL (ref 8.9–10.3)
Chloride: 106 mmol/L (ref 98–111)
Creatinine, Ser: 0.67 mg/dL (ref 0.44–1.00)
GFR, Estimated: >60 mL/min (ref >60)
Glucose, Bld: 134 mg/dL — ABNORMAL HIGH (ref 70–99)
Potassium: 4.1 mmol/L (ref 3.5–5.1)
Sodium: 138 mmol/L (ref 135–145)

## 2021-12-18 LAB — RESP PANEL BY RT-PCR (FLU A&B, COVID) ARPGX2
Influenza A by PCR: NEGATIVE
Influenza B by PCR: NEGATIVE
SARS Coronavirus 2 by RT PCR: NEGATIVE

## 2021-12-18 LAB — BRAIN NATRIURETIC PEPTIDE: B Natriuretic Peptide: 19.9 pg/mL (ref 0.0–100.0)

## 2021-12-18 MED ORDER — ONDANSETRON 4 MG PO TBDP
4.0000 mg | ORAL_TABLET | Freq: Once | ORAL | Status: AC
  Administered 2021-12-18: 4 mg via ORAL
  Filled 2021-12-18: qty 1

## 2021-12-18 MED ORDER — PREDNISONE 20 MG PO TABS
40.0000 mg | ORAL_TABLET | Freq: Every day | ORAL | Status: DC
  Administered 2021-12-19: 40 mg via ORAL
  Filled 2021-12-18: qty 2

## 2021-12-18 MED ORDER — BENZONATATE 100 MG PO CAPS
100.0000 mg | ORAL_CAPSULE | Freq: Once | ORAL | Status: AC
  Administered 2021-12-18: 100 mg via ORAL
  Filled 2021-12-18: qty 1

## 2021-12-18 MED ORDER — MELATONIN 5 MG PO TABS
5.0000 mg | ORAL_TABLET | Freq: Every day | ORAL | Status: DC
  Administered 2021-12-18: 5 mg via ORAL
  Filled 2021-12-18: qty 1

## 2021-12-18 MED ORDER — IPRATROPIUM-ALBUTEROL 0.5-2.5 (3) MG/3ML IN SOLN
3.0000 mL | RESPIRATORY_TRACT | Status: DC
  Administered 2021-12-18 (×2): 3 mL via RESPIRATORY_TRACT
  Filled 2021-12-18 (×3): qty 3

## 2021-12-18 MED ORDER — BENZONATATE 100 MG PO CAPS: 100.0000 mg | ORAL_CAPSULE | Freq: Three times a day (TID) | ORAL | Status: DC | PRN

## 2021-12-18 MED ORDER — IPRATROPIUM-ALBUTEROL 0.5-2.5 (3) MG/3ML IN SOLN
3.0000 mL | Freq: Four times a day (QID) | RESPIRATORY_TRACT | Status: DC
  Administered 2021-12-18 – 2021-12-19 (×3): 3 mL via RESPIRATORY_TRACT
  Filled 2021-12-18 (×4): qty 3

## 2021-12-18 MED ORDER — SODIUM CHLORIDE 0.9 % IV SOLN
2.0000 g | INTRAVENOUS | Status: DC
  Administered 2021-12-18 – 2021-12-19 (×2): 2 g via INTRAVENOUS
  Filled 2021-12-18: qty 20

## 2021-12-18 MED ORDER — IBUPROFEN 400 MG PO TABS
400.0000 mg | ORAL_TABLET | Freq: Four times a day (QID) | ORAL | Status: DC | PRN
  Administered 2021-12-18: 400 mg via ORAL
  Filled 2021-12-18 (×2): qty 1

## 2021-12-18 NOTE — Progress Notes (Signed)
Pt just threw up and is still feeling nauseous. RN messaged MD on call to see if we can have an order for an antiemetic. Awaiting response.   Gloria Cox Gloria Cox

## 2021-12-18 NOTE — TOC Initial Note (Addendum)
Transition of Care [facility]) - Initial/Assessment Note    Patient Details  Name: Gloria Cox MRN: [id number] Date of Birth: 1995-09-13  Transition of Care [facility]) CM/SW Contact:    Tom-Johnson, Hershal Coria, RN Phone Number: 12/18/2021, 2:34 PM  Clinical Narrative:                  CM spoke with patient about discharge needs at bedside. Admitted for Respiratory Failure with Hypoxia, Productive coughing 2/2 CAP. Currently on 3L O2 acute. Has Hx of Asthma. On Neb tx, steroids and V abx.  From home with her mother, three 60 old daughter, significant other and grand mother. Currently employed part time at a [employer].  Does not have a PCP, hosp f/u scheduled with [facility] and info on AVS.  [facility] [address]  No PT/OT recommendations noted. Family to transport at discharge. CM will continue to follow with needs as patient progresses towards discharge.    Expected Discharge Plan: Home/Self Care Barriers to Discharge: Continued Medical Work up   Patient Goals and CMS Choice Patient states their goals for this hospitalization and ongoing recovery are:: To return home CMS Medicare.gov Compare Post Acute Care list provided to:: Patient Choice offered to / list presented to : NA  Expected Discharge Plan and Services Expected Discharge Plan: Home/Self Care   Discharge Planning Services: CM Consult Post Acute Care Choice: NA Living arrangements for the past 2 months: Single Family Home                 DME Arranged: N/A DME Agency: NA       HH Arranged: NA HH Agency: NA        Prior Living Arrangements/Services Living arrangements for the past 2 months: Single Family Home Lives with:: Parents, Spouse, Minor Children Patient language and need for interpreter reviewed:: Yes Do you feel safe going back to the place where you live?: Yes      Need for Family Participation in Patient Care: Yes (Comment) Care giver support system in place?:  Yes (comment)   Criminal Activity/Legal Involvement Pertinent to Current Situation/Hospitalization: No - Comment as needed  Activities of Daily Living Home Assistive Devices/Equipment: None ADL Screening (condition at time of admission) Patient's cognitive ability adequate to safely complete daily activities?: Yes Is the patient deaf or have difficulty hearing?: No Does the patient have difficulty seeing, even when wearing glasses/contacts?: No Does the patient have difficulty concentrating, remembering, or making decisions?: No Patient able to express need for assistance with ADLs?: Yes Does the patient have difficulty dressing or bathing?: No Independently performs ADLs?: Yes (appropriate for developmental age) Does the patient have difficulty walking or climbing stairs?: No Weakness of Legs: None Weakness of Arms/Hands: None  Permission Sought/Granted Permission sought to share information with : Case Manager, Family Supports Permission granted to share information with : Yes, Verbal Permission Granted              Emotional Assessment Appearance:: Appears stated age Attitude/Demeanor/Rapport: Engaged, Gracious Affect (typically observed): Accepting, Appropriate, Calm, Hopeful, Pleasant Orientation: : Oriented to Self, Oriented to Place, Oriented to  Time, Oriented to Situation Alcohol / Substance Use: Not Applicable Psych Involvement: No (comment)  Admission diagnosis:  Hypoxia [R09.02] Multifocal pneumonia [J18.9] Patient Active Problem List   Diagnosis Date Noted   Insomnia 12/18/2021   Headache 12/18/2021   Acute respiratory failure with hypoxia (HCC) 12/17/2021   Tobacco use 12/17/2021   S/P cesarean section  09/06/2021   Gestational hypertension 09/06/2021   Post term pregnancy over 40 weeks 09/05/2021   Obesity in pregnancy 02/16/2021   History of asthma 02/16/2021   Alcohol use affecting pregnancy in first trimester 02/16/2021   Tobacco use affecting  pregnancy in first trimester, antepartum 02/16/2021   Encounter for supervision of normal first pregnancy in third trimester 02/12/2021   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

## 2021-12-18 NOTE — Plan of Care (Signed)

## 2021-12-18 NOTE — Assessment & Plan Note (Signed)
Has history of migraines and is on excedrin outpatient  -PRN ibuprofen

## 2021-12-18 NOTE — Assessment & Plan Note (Signed)
Patient reporting insomnia due to ongoing coughing -scheduled melatonin

## 2021-12-18 NOTE — Progress Notes (Signed)
Daily Progress Note Intern Pager: [phone]  Patient name: Gloria Cox Medical record number: [id number] Date of birth: [birth year] Age: 26 y.o. Gender: female  Primary Care Provider: Patient, No Pcp Per Consultants: none Code Status: Full  Pt Overview and Major Events to Date:  9/20: Admit to FMTS  Assessment and Plan: Gloria Cox is a 26 y.o. female presenting with SOB and wheezing in the setting of recent URI. Differential for this patient's presentation of this includes PNA (confirmed by CT findings) vs asthma exacerbation (diffuse wheezing on exam with h/o asthma)  * Acute respiratory failure with hypoxia ([place]) H/o asthma controlled with PRN albuterol with worsening symptoms in the past few months. Multifocal PNA superimposed on asthma exacerbation. Unknown cause of multifocal severe PNA in a 26yo female, rule out immunosuppression with HIV and other viral respiratory viruses.  -Monitor oxygen saturation (O2>92%) and respiratory status -Continue Ceftriaxone and Azithromycin for CAP coverage.  -scheduled Duonebs q4 given ongoing wheezing and coughing -started steroid course x 5 days (9/22-9/26)  -PRN tessalon pearls  -wean O2 as appropriate  - f/u HIV, flu - if not improving, can consider MRSA swab and consult to pulmonary -consider starting controller med for asthma at time of discharge   Headache Has history of migraines and is on excedrin outpatient  -PRN ibuprofen  Insomnia Patient reporting insomnia due to ongoing coughing -scheduled melatonin  Tobacco use Currently vapes nicotine x 5-6 years. Former smoker, 1-pack year history. -Counseled patient on tobacco cessation   Sepsis due to pneumonia (HCC)-resolved as of 12/17/2021      FEN/GI: Regular PPx: Lovenox  Dispo: Pending clinical improvement  Subjective:  Patient reporting she did not sleep well overnight due to ongoing coughing. Continues to report exacerbations of coughing that cause  chest, abdominal pain, and headache. She reports history of asthma since she was 16 that was controlled with PRN albuterol but worsened with pregnancy.  Objective: Temp:  [97.7 F (36.5 C)-98.1 F (36.7 C)] 97.9 F (36.6 C) (09/21 0254) Pulse Rate:  [90-105] 102 (09/21 0254) Resp:  [18-29] 18 (09/21 0254) BP: (117-138)/(51-90) 133/75 (09/21 0254) SpO2:  [90 %-98 %] 93 % (09/21 0254) Physical Exam: General: alert, well-appearing Caucasian female in no acute distress on O2 Cardiovascular: RRR. No m/r/g. Respiratory: expiratory wheezing throughout. Satting well on O2. Abdomen: soft, non-tender, non-distended Extremities: No peripheral edema  Neuro: alert and answering questions appropriately  Laboratory: Most recent CBC Lab Results  Component Value Date   WBC 17.7 (H) 12/18/2021   HGB 12.5 12/18/2021   HCT 39.4 12/18/2021   MCV 80.7 12/18/2021   PLT 298 12/18/2021   Most recent BMP    Latest Ref Rng & Units 12/18/2021    2:19 AM  BMP  Glucose 70 - 99 mg/dL 134   BUN 6 - 20 mg/dL 6   Creatinine 0.44 - 1.00 mg/dL 0.67   Sodium 135 - 145 mmol/L 138   Potassium 3.5 - 5.1 mmol/L 4.1   Chloride 98 - 111 mmol/L 106   CO2 22 - 32 mmol/L 22   Calcium 8.9 - 10.3 mg/dL 9.7   Repeat COVID testing negative.   CXR negative for acute process.  CTA notable for scattered bilateral bronchial wall thickening and areas of mucoid impaction, with superimposed scattered ground-glass airspace disease - favor multifocal pneumonia. Negative for PE.   Rolanda Lundborg, MD 12/18/2021, 6:59 AM   PGY-1, [place] Intern pager: [phone], text pages welcome Secure  chat group [facility] Teaching Service

## 2021-12-19 ENCOUNTER — Other Ambulatory Visit (HOSPITAL_COMMUNITY): Payer: Self-pay

## 2021-12-19 DIAGNOSIS — J189 Pneumonia, unspecified organism: Secondary | ICD-10-CM | POA: Diagnosis not present

## 2021-12-19 DIAGNOSIS — R0902 Hypoxemia: Secondary | ICD-10-CM | POA: Diagnosis not present

## 2021-12-19 DIAGNOSIS — R519 Headache, unspecified: Secondary | ICD-10-CM

## 2021-12-19 DIAGNOSIS — J9601 Acute respiratory failure with hypoxia: Secondary | ICD-10-CM | POA: Diagnosis not present

## 2021-12-19 LAB — HIV ANTIBODY (ROUTINE TESTING W REFLEX): HIV Screen 4th Generation wRfx: NONREACTIVE

## 2021-12-19 LAB — CBC
HCT: 35.3 % — ABNORMAL LOW (ref 36.0–46.0)
Hemoglobin: 11.2 g/dL — ABNORMAL LOW (ref 12.0–15.0)
MCH: 25.8 pg — ABNORMAL LOW (ref 26.0–34.0)
MCHC: 31.7 g/dL (ref 30.0–36.0)
MCV: 81.3 fL (ref 80.0–100.0)
Platelets: 226 10*3/uL (ref 150–400)
RBC: 4.34 MIL/uL (ref 3.87–5.11)
RDW: 15 % (ref 11.5–15.5)
WBC: 12.7 10*3/uL — ABNORMAL HIGH (ref 4.0–10.5)
nRBC: 0 % (ref 0.0–0.2)

## 2021-12-19 MED ORDER — MOMETASONE FURO-FORMOTEROL FUM 100-5 MCG/ACT IN AERO
2.0000 | INHALATION_SPRAY | RESPIRATORY_TRACT | 1 refills | Status: DC | PRN
  Filled 2021-12-19: qty 13, 30d supply, fill #0

## 2021-12-19 MED ORDER — CEFDINIR 300 MG PO CAPS: 300.0000 mg | ORAL_CAPSULE | Freq: Two times a day (BID) | ORAL | Status: DC

## 2021-12-19 MED ORDER — MOMETASONE FURO-FORMOTEROL FUM 100-5 MCG/ACT IN AERO: 2.0000 | INHALATION_SPRAY | Freq: Two times a day (BID) | RESPIRATORY_TRACT | Status: DC | PRN

## 2021-12-19 MED ORDER — PREDNISONE 20 MG PO TABS
40.0000 mg | ORAL_TABLET | Freq: Every day | ORAL | 0 refills | Status: AC
  Filled 2021-12-19: qty 8, 4d supply, fill #0

## 2021-12-19 MED ORDER — CEFDINIR 300 MG PO CAPS
300.0000 mg | ORAL_CAPSULE | Freq: Two times a day (BID) | ORAL | 0 refills | Status: AC
  Filled 2021-12-19: qty 4, 2d supply, fill #0

## 2021-12-19 MED ORDER — AZITHROMYCIN 500 MG PO TABS: 500.0000 mg | ORAL_TABLET | Freq: Once | ORAL | Status: DC

## 2021-12-19 MED ORDER — PREDNISONE 20 MG PO TABS
40.0000 mg | ORAL_TABLET | Freq: Every day | ORAL | 0 refills | Status: DC
  Filled 2021-12-19: qty 2, 1d supply, fill #0

## 2021-12-19 NOTE — Discharge Summary (Addendum)
[facility] Discharge Summary  Patient name: Gloria Cox Medical record number: [id number] Date of birth: [birth year] Age: 26 y.o. Gender: female Date of Admission: 12/17/2021     Date of Discharge: 12/19/2021 Admitting Physician: Martyn Malay, MD  Primary Care Provider: Patient, No Pcp Per Consultants: none  Indication for Hospitalization: SOB  Brief Hospital Course:  Gloria Cox is a 26 year old female presenting with SOB and wheezing in the setting of recent URI. PMHx includes asthma,migraine, HLD and GERD.  Acute respiratory failure with hypoxia Multifocal PNA Asthma exacerbation Patient with worsening SOB and wheezing not relieved by albuterol and steroid therapy. She has history of asthma, diagnosed at 43 and only controlled with PRN albuterol. Upon admission patient met sepsis criteria due to tachycardia, tachypnea and leukocytosis. Patient initially required 3L [place] supplemental oxygen which was increased to 4L to maintain oxygen saturation during hospitalization. Imaging confirmed multifocal PNA and patient was started in Ceftriaxone and Azithromycin for CAP coverage and transitioned to Cefdinir and Azithromycin to complete 5 day course (9/20-9/4). COVID, flu, HIV negative. She was also started on scheduled duonebs with improvement in breathing. She was started on steroid course. She was started on PRN dulera for mild asthma as she meets this criteria.   Insomnia Scheduled melatonin for sleep.   PCP follow-up ([place] will pick up patient with Dr. Zigmund Daniel to assume PCP): 1) Asthma management, started dulara  2) Continue to discuss vaping cessation 3) check A1C outpatient  4) follow up for birth control, patient should use back up method for one week, she was informed of this prior to dispo. 5) CBC on f/u    Discharge Diagnoses/Problem List:  Principal Problem:   Acute respiratory failure with hypoxia (HCC) Active Problems:    Multifocal pneumonia   Tobacco use   Insomnia   Headache   Disposition: Home   Discharge Condition: Stable  Discharge Exam:  Physical Exam: General: well appearing Caucasian female sitting in no acute distress on room air  Cardiovascular: RRR. No m/r/g. Respiratory: Expiratory wheezing throughout Abdomen: Soft, non-tender, non-distended  Extremities: No peripheral edema.   Significant Procedures: none  Significant Labs and Imaging:  Recent Labs  Lab 12/17/21 1302 12/17/21 1336 12/18/21 0219 12/19/21 0145  WBC 14.0*  --  17.7* 12.7*  HGB 13.0 13.9 12.5 11.2*  HCT 40.7 41.0 39.4 35.3*  PLT 290  --  298 226   Recent Labs  Lab 12/17/21 1302 12/17/21 1336 12/17/21 1708 12/18/21 0219  NA 138 138  --  138  K 3.7 3.7  --  4.1  CL 106  --   --  106  CO2 18*  --   --  22  GLUCOSE 180*  --   --  134*  BUN 5*  --   --  6  CREATININE 0.75  --   --  0.67  CALCIUM 9.6  --   --  9.7  ALKPHOS  --   --  85  --   AST  --   --  22  --   ALT  --   --  46*  --   ALBUMIN  --   --  3.8  --      CT Angio Chest PE W and/or Wo Contrast  Result Date: 12/17/2021 CLINICAL DATA:  Worsening short of breath and cough, hemoptysis, wheezing, hypoxia EXAM: CT ANGIOGRAPHY CHEST WITH CONTRAST TECHNIQUE: Multidetector CT imaging of the chest was performed using  the standard protocol during bolus administration of intravenous contrast. Multiplanar CT image reconstructions and MIPs were obtained to evaluate the vascular anatomy. RADIATION DOSE REDUCTION: This exam was performed according to the departmental dose-optimization program which includes automated exposure control, adjustment of the mA and/or kV according to patient size and/or use of iterative reconstruction technique. CONTRAST:  37m OMNIPAQUE IOHEXOL 350 MG/ML SOLN COMPARISON:  12/17/2021 FINDINGS: Cardiovascular: This is a technically adequate evaluation of the pulmonary vasculature. No filling defects or pulmonary emboli. The heart is  unremarkable without pericardial effusion. No evidence of thoracic aortic aneurysm or dissection. Mediastinum/Nodes: No enlarged mediastinal, hilar, or axillary lymph nodes. Thyroid gland, trachea, and esophagus demonstrate no significant findings. Lungs/Pleura: Scattered ground-glass opacities are seen throughout the lungs, most pronounced within the right middle and right upper lobe. This could reflect sequela of hemorrhage or infection such as COVID. No effusion or pneumothorax. There are scattered areas of bronchial wall thickening and opacification, primarily at the distal segmental and subsegmental levels, which may reflect infection or reactive airway disease. Upper Abdomen: No acute abnormality. Musculoskeletal: No acute or destructive bony lesions. Reconstructed images demonstrate no additional findings. Review of the MIP images confirms the above findings. IMPRESSION: 1. Scattered bilateral bronchial wall thickening and areas of mucoid impaction, with superimposed scattered ground-glass airspace disease as above. Findings favor to be related to multifocal pneumonia, including viral etiologies such as COVID. 2. No evidence of pulmonary embolus. Electronically Signed   By: MRanda NgoM.D.   On: 12/17/2021 20:06   DG Chest 2 View  Result Date: 12/17/2021 CLINICAL DATA:  Cough and shortness of breath.  Significant wheeze. EXAM: CHEST - 2 VIEW COMPARISON:  None Available. FINDINGS: The heart size and mediastinal contours are within normal limits. Both lungs are clear. The visualized skeletal structures are unremarkable. IMPRESSION: No focal consolidation or pleural effusion. Electronically Signed   By: IKeane PoliceD.O.   On: 12/17/2021 09:22     Results/Tests Pending at Time of Discharge: none  Discharge Medications:  Allergies as of 12/19/2021   No Known Allergies      Medication List     STOP taking these medications    albuterol 108 (90 Base) MCG/ACT inhaler Commonly known as:  VENTOLIN HFA   furosemide 20 MG tablet Commonly known as: LASIX   ibuprofen 600 MG tablet Commonly known as: ADVIL       TAKE these medications    Acetaminophen Extra Strength 500 MG Tabs Take 2 tablets (1,000 mg total) by mouth every 6 (six) hours.   cefdinir 300 MG capsule Commonly known as: OMNICEF Take 1 capsule (300 mg total) by mouth every 12 (twelve) hours for 2 days. Start taking on: December 20, 2021   [facility]100-5 MCG/ACT Aero Generic drug: mometasone-formoterol Inhale 2 puffs into the lungs as needed (SOB).   Excedrin Migraine 250-250-65 MG tablet Generic drug: aspirin-acetaminophen-caffeine Take 1 tablet by mouth every 6 (six) hours as needed for headache or migraine.   Hailey 24 Fe 1-20 MG-MCG(24) tablet Generic drug: Norethindrone Acetate-Ethinyl Estrad-FE Take 1 tablet by mouth daily at 6 PM. What changed: Another medication with the same name was removed. Continue taking this medication, and follow the directions you see here.   Mucinex Sinus-Max Cong & Pain 10-5-325 MG Caps Generic drug: DM-Phenylephrine-Acetaminophen Take 1 capsule by mouth every 6 (six) hours as needed (for congestion).   predniSONE 20 MG tablet Commonly known as: DELTASONE Take 2 tablets (40 mg total) by mouth daily with breakfast.  Start taking on: December 20, 2021        Discharge Instructions: Please refer to Patient Instructions section of EMR for full details.  Patient was counseled important signs and symptoms that should prompt return to medical care, changes in medications, dietary instructions, activity restrictions, and follow up appointments.   Follow-Up Appointments: PCP: Dr. Janna Arch, MD 12/19/2021, 12:06 PM PGY-1, [facility]    Upper Level Addendum:  I have seen and evaluated this patient along with Dr. Lurline Hare and reviewed the above note, making necessary revisions as appropriate.  I agree with the medical decision making  and physical exam as noted above.  Erskine Emery, MD PGY-2 [facility] Residency

## 2021-12-19 NOTE — TOC Transition Note (Signed)
Transition of Care [facility]) - CM/SW Discharge Note   Patient Details  Name: Gloria Cox MRN: [id number] Date of Birth: 03-31-1995  Transition of Care [facility]) CM/SW Contact:  Tom-Johnson, Renea Ee, RN Phone Number: 12/19/2021, 12:28 PM   Clinical Narrative:     Patient is scheduled for discharge today. Hosp f/u info on AVS. Family to transport at discharge. No further TOC needs noted.   Final next level of care: Home/Self Care Barriers to Discharge: Barriers Resolved   Patient Goals and CMS Choice Patient states their goals for this hospitalization and ongoing recovery are:: To return home CMS Medicare.gov Compare Post Acute Care list provided to:: Patient Choice offered to / list presented to : NA  Discharge Placement                Patient to be transferred to facility by: Family      Discharge Plan and Services   Discharge Planning Services: CM Consult Post Acute Care Choice: NA          DME Arranged: N/A DME Agency: NA       HH Arranged: NA HH Agency: NA        Social Determinants of Health (SDOH) Interventions     Readmission Risk Interventions     No data to display

## 2021-12-19 NOTE — Progress Notes (Signed)
SATURATION QUALIFICATIONS: (This note is used to comply with regulatory documentation for home oxygen)  Patient Saturations on Room Air at Rest = 96%  Patient Saturations on Room Air while Ambulating = 92%  Patient Saturations on 0 Liters of oxygen while Ambulating = 92%  Please briefly explain why patient needs home oxygen: Patient able to ambulate without oxygen

## 2021-12-19 NOTE — Discharge Instructions (Addendum)
Dear Gloria Cox,   Thank you so much for allowing [place] to be part of your care!  You were admitted to [facility] for pneumonia.   We treated you with antibiotics and albuterol treatment   You can take the [facility] inhaler as needed   Please use condoms for the next week   POST-HOSPITAL & CARE INSTRUCTIONS We are sending you home with antibiotics. You should finish these on 9/24. Your birth control was not continued while inpatient. You should use a back up method such as condoms for the next week to prevent unintentional pregnancy.  Please let PCP/Specialists know of any changes that were made.  Please see medications section of this packet for any medication changes.   DOCTOR'S APPOINTMENT & FOLLOW UP CARE INSTRUCTIONS  Future Appointments  Date Time Provider [place]  01/05/2022 10:15 AM Teola Bradley, MD [facility]    RETURN PRECAUTIONS: -shortness of breath - chest pain - nausea or vomiting, fever, chills - swelling in the extremities    Take care and be well!  [facility]  [place], [place] [address] [phone]

## 2021-12-22 ENCOUNTER — Other Ambulatory Visit (HOSPITAL_COMMUNITY): Payer: Self-pay

## 2021-12-28 NOTE — Progress Notes (Unsigned)
  SUBJECTIVE:   CHIEF COMPLAINT / HPI:   Hospital Follow Up She has been taking the Dulera 1-2 times a day everyday She does note great improvement of her symptoms but still wakes nightly 2/2 to wheezing  Patient completed her steroid burst and Abx  Notes a remote history of asthma prior to delivery of her daughter but it was not bothersome until after delivery, now three months post partum  No fevers or chills  Patient does get SOB with daily activities and has to take a rest     PERTINENT  PMH / PSH:   Past Medical History:  Diagnosis Date   Asthma    Dyspepsia    Goiter    Hypothyroidism    [place]    Pre-diabetes    Pre-diabetes    [place]    Thyroiditis, autoimmune     OBJECTIVE:  BP 101/60   Pulse 93   Ht 5\' 3"  (1.6 m)   Wt 233 lb 3.2 oz (105.8 kg)   LMP 11/26/2021   SpO2 98%   BMI 41.31 kg/m   General: NAD, pleasant, able to participate in exam Cardiac: RRR, no murmurs auscultated Respiratory: diffuse rhonchi without focal diminishment or evident wheezing, good air movement throughout  Abdomen: soft, non-tender, non-distended, normoactive bowel sounds Extremities: warm and well perfused, no edema or cyanosis Psych: Normal affect and mood  ASSESSMENT/PLAN:  History of asthma -     CBC -     Basic metabolic panel -     Dulera; Inhale 2 puffs into the lungs 2 (two) times daily. Trial for three weeks  Dispense: 1 each; Refill: 2 -     Albuterol Sulfate; Take 3 mLs (2.5 mg total) by nebulization every 6 (six) hours as needed for wheezing or shortness of breath.  Dispense: 150 mL; Refill: 1  Need for vaccination -     Pneumococcal conjugate vaccine 20-valent  Elevated glucose Assessment & Plan: Elevated in hospital, on steroids. A1C ordered today.   Orders: -     POCT glycosylated hemoglobin (Hb A1C)  Need for immunization against influenza -     Flu Vaccine QUAD 36mo+IM (Fluarix, Fluzone & Alfiuria Quad PF)  Multifocal pneumonia Assessment &  Plan: Completed treatment with abx as well as steroid burst Likely caused moderate to severe asthma exacerbation  Will increase Dulera dosage to 200/5 two puffs 2xdaily  Also albuterol neb and neb machine given in office to use PRN  Patient agreeable to plan  Will be able to decrease inhaler usage after exacerbation improves  No hypoxemia in office or hemodynamic instability.     Return in about 4 weeks (around 01/26/2022) for Asthma f/u . Erskine Emery, MD 12/30/2021, 9:06 PM PGY-2, [place]

## 2021-12-29 ENCOUNTER — Encounter: Payer: Self-pay | Admitting: Student

## 2021-12-29 ENCOUNTER — Ambulatory Visit (INDEPENDENT_AMBULATORY_CARE_PROVIDER_SITE_OTHER): Payer: Medicaid Other | Admitting: Student

## 2021-12-29 VITALS — BP 101/60 | HR 93 | Ht 63.0 in | Wt 233.2 lb

## 2021-12-29 DIAGNOSIS — Z8709 Personal history of other diseases of the respiratory system: Secondary | ICD-10-CM | POA: Diagnosis not present

## 2021-12-29 DIAGNOSIS — J189 Pneumonia, unspecified organism: Secondary | ICD-10-CM

## 2021-12-29 DIAGNOSIS — Z23 Encounter for immunization: Secondary | ICD-10-CM | POA: Diagnosis present

## 2021-12-29 DIAGNOSIS — R7309 Other abnormal glucose: Secondary | ICD-10-CM | POA: Diagnosis not present

## 2021-12-29 LAB — POCT GLYCOSYLATED HEMOGLOBIN (HGB A1C): HbA1c, POC (controlled diabetic range): 5.5 % (ref 0.0–7.0)

## 2021-12-29 MED ORDER — DULERA 200-5 MCG/ACT IN AERO: 2.0000 | INHALATION_SPRAY | Freq: Two times a day (BID) | RESPIRATORY_TRACT | 2 refills | Status: DC

## 2021-12-29 MED ORDER — ALBUTEROL SULFATE (2.5 MG/3ML) 0.083% IN NEBU: 2.5000 mg | INHALATION_SOLUTION | Freq: Four times a day (QID) | RESPIRATORY_TRACT | 1 refills | Status: DC | PRN

## 2021-12-29 NOTE — Patient Instructions (Addendum)
It was great to see you today! Thank you for choosing [facility] for your primary care. Gloria Cox was seen for follow up.  Today we addressed: Continue with the [facility] two puffs twice daily  You can also use the albuterol nebulizer machine AS NEEDED every 6 hours Let's try this for 3 weeks and we will reassess    If you haven't already, sign up for My Chart to have easy access to your labs results, and communication with your primary care physician.  We are checking some labs today. If they are abnormal, I will call you. If they are normal, I will send you a MyChart message (if it is active) or a letter in the mail. If you do not hear about your labs in the next 2 weeks, please call the office. I recommend that you always bring your medications to each appointment as this makes it easy to ensure you are on the correct medications and helps [place] not miss refills when you need them. Call the clinic at [phone] if your symptoms worsen or you have any concerns.  You should return to our clinic Return in about 4 weeks (around 01/26/2022) for Asthma f/u . Please arrive 15 minutes before your appointment to ensure smooth check in process.  We appreciate your efforts in making this happen.  Thank you for allowing me to participate in your care, Erskine Emery, MD 12/29/2021, 2:43 PM PGY-2, [place]

## 2021-12-30 DIAGNOSIS — R7309 Other abnormal glucose: Secondary | ICD-10-CM | POA: Insufficient documentation

## 2021-12-30 LAB — CBC
Hematocrit: 38.9 % (ref 34.0–46.6)
Hemoglobin: 12.2 g/dL (ref 11.1–15.9)
MCH: 25.3 pg — ABNORMAL LOW (ref 26.6–33.0)
MCHC: 31.4 g/dL — ABNORMAL LOW (ref 31.5–35.7)
MCV: 81 fL (ref 79–97)
Platelets: 317 10*3/uL (ref 150–450)
RBC: 4.83 x10E6/uL (ref 3.77–5.28)
RDW: 14.9 % (ref 11.7–15.4)
WBC: 11.2 10*3/uL — ABNORMAL HIGH (ref 3.4–10.8)

## 2021-12-30 LAB — BASIC METABOLIC PANEL
BUN/Creatinine Ratio: 15 (ref 9–23)
BUN: 11 mg/dL (ref 6–20)
CO2: 18 mmol/L — ABNORMAL LOW (ref 20–29)
Calcium: 9.3 mg/dL (ref 8.7–10.2)
Chloride: 103 mmol/L (ref 96–106)
Creatinine, Ser: 0.72 mg/dL (ref 0.57–1.00)
Glucose: 108 mg/dL — ABNORMAL HIGH (ref 70–99)
Potassium: 4.2 mmol/L (ref 3.5–5.2)
Sodium: 139 mmol/L (ref 134–144)
eGFR: 120 mL/min/{1.73_m2} (ref >59)

## 2021-12-30 NOTE — Assessment & Plan Note (Addendum)
Completed treatment with abx as well as steroid burst Likely caused moderate to severe asthma exacerbation  Will increase Dulera dosage to 200/5 two puffs 2xdaily  Also albuterol neb and neb machine given in office to use PRN  Patient agreeable to plan  Will be able to decrease inhaler usage after exacerbation improves  No hypoxemia in office or hemodynamic instability.

## 2021-12-30 NOTE — Assessment & Plan Note (Signed)
Elevated in hospital, on steroids. A1C ordered today.

## 2022-01-01 ENCOUNTER — Other Ambulatory Visit (HOSPITAL_COMMUNITY): Payer: Self-pay

## 2022-01-05 ENCOUNTER — Encounter: Payer: Medicaid Other | Admitting: Family Medicine

## 2022-01-05 ENCOUNTER — Encounter (HOSPITAL_COMMUNITY): Payer: Self-pay | Admitting: Student

## 2022-01-07 ENCOUNTER — Other Ambulatory Visit: Payer: Self-pay | Admitting: Obstetrics & Gynecology

## 2022-01-07 DIAGNOSIS — J45909 Unspecified asthma, uncomplicated: Secondary | ICD-10-CM

## 2022-01-12 ENCOUNTER — Encounter: Payer: Self-pay | Admitting: Student

## 2022-01-12 MED ORDER — BUDESONIDE-FORMOTEROL FUMARATE 160-4.5 MCG/ACT IN AERO: 2.0000 | INHALATION_SPRAY | Freq: Two times a day (BID) | RESPIRATORY_TRACT | 3 refills | Status: DC

## 2022-02-16 ENCOUNTER — Other Ambulatory Visit (HOSPITAL_COMMUNITY): Payer: Self-pay

## 2022-03-05 ENCOUNTER — Ambulatory Visit (INDEPENDENT_AMBULATORY_CARE_PROVIDER_SITE_OTHER): Payer: Medicaid Other | Admitting: General Practice

## 2022-03-05 VITALS — BP 135/68 | HR 78 | Ht 63.0 in | Wt 236.9 lb

## 2022-03-05 DIAGNOSIS — Z3200 Encounter for pregnancy test, result unknown: Secondary | ICD-10-CM

## 2022-03-05 DIAGNOSIS — Z3201 Encounter for pregnancy test, result positive: Secondary | ICD-10-CM | POA: Diagnosis not present

## 2022-03-05 LAB — POCT URINE PREGNANCY: Preg Test, Ur: POSITIVE — AB

## 2022-03-05 NOTE — Progress Notes (Signed)
Gloria Cox presents today for UPT. She has no unusual complaints. LMP: 01-28-22    OBJECTIVE: Appears well, in no apparent distress.  OB History     Gravida  1   Para  1   Term  1   Preterm      AB      Living  1      SAB      IAB      Ectopic      Multiple  0   Live Births  1          Home UPT Result: Positive x3 In-Office UPT result: Positive   I have reviewed the patient's medical, obstetrical, social, and family histories, and medications.   ASSESSMENT: Positive pregnancy test  PLAN Prenatal care to be completed at: [facility]

## 2022-04-02 IMAGING — CR DG FOREARM 2V*L*
2 series · 2 of 2 positions shown · non-contrast
Comparison: None.

CLINICAL DATA: Pain after dog bite

EXAM:
LEFT FOREARM - 2 VIEW

[x forearm ap left]
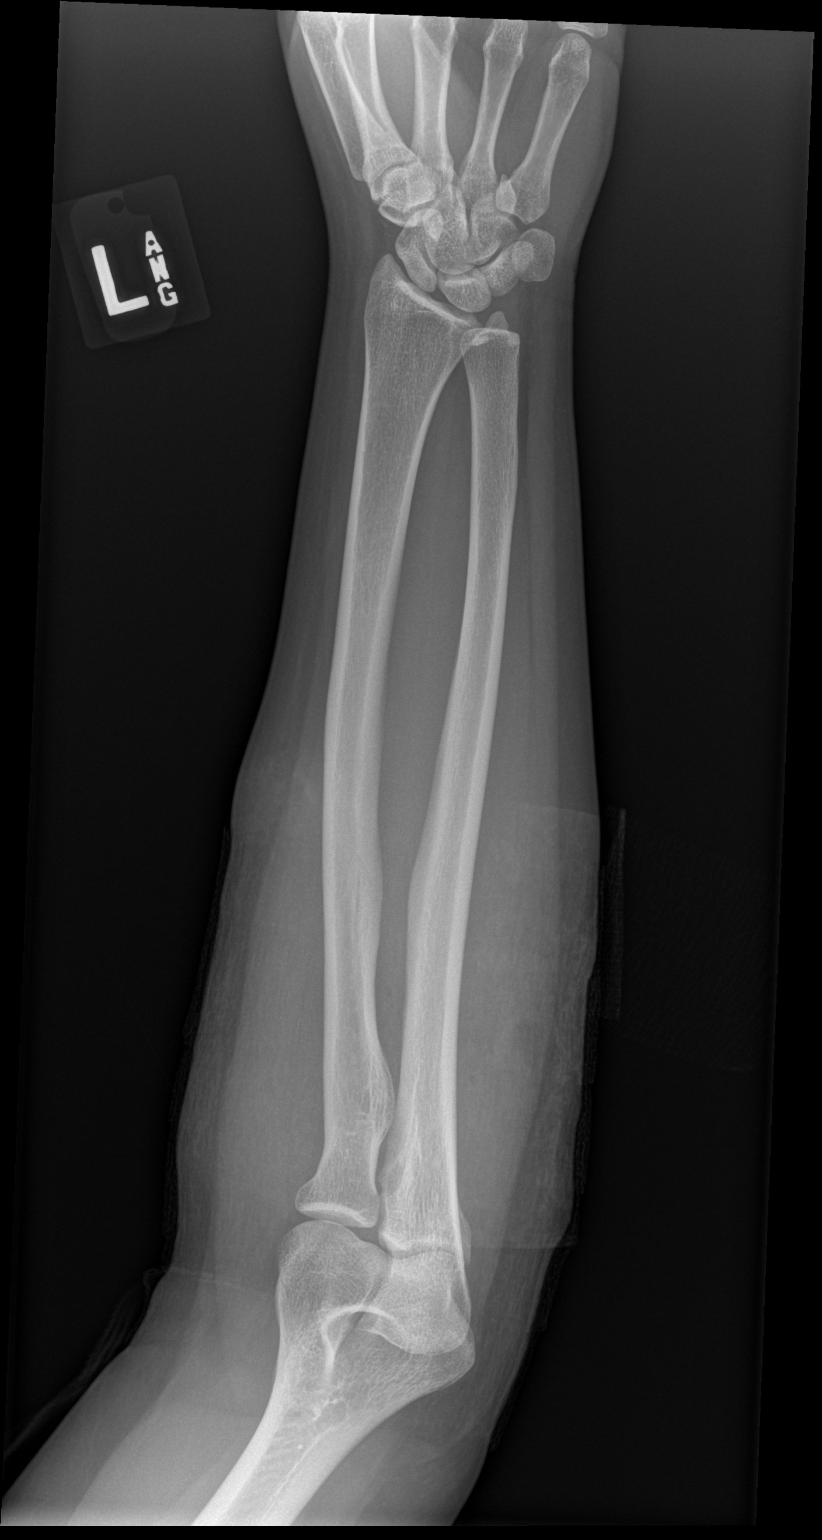

[x forearm lat left]
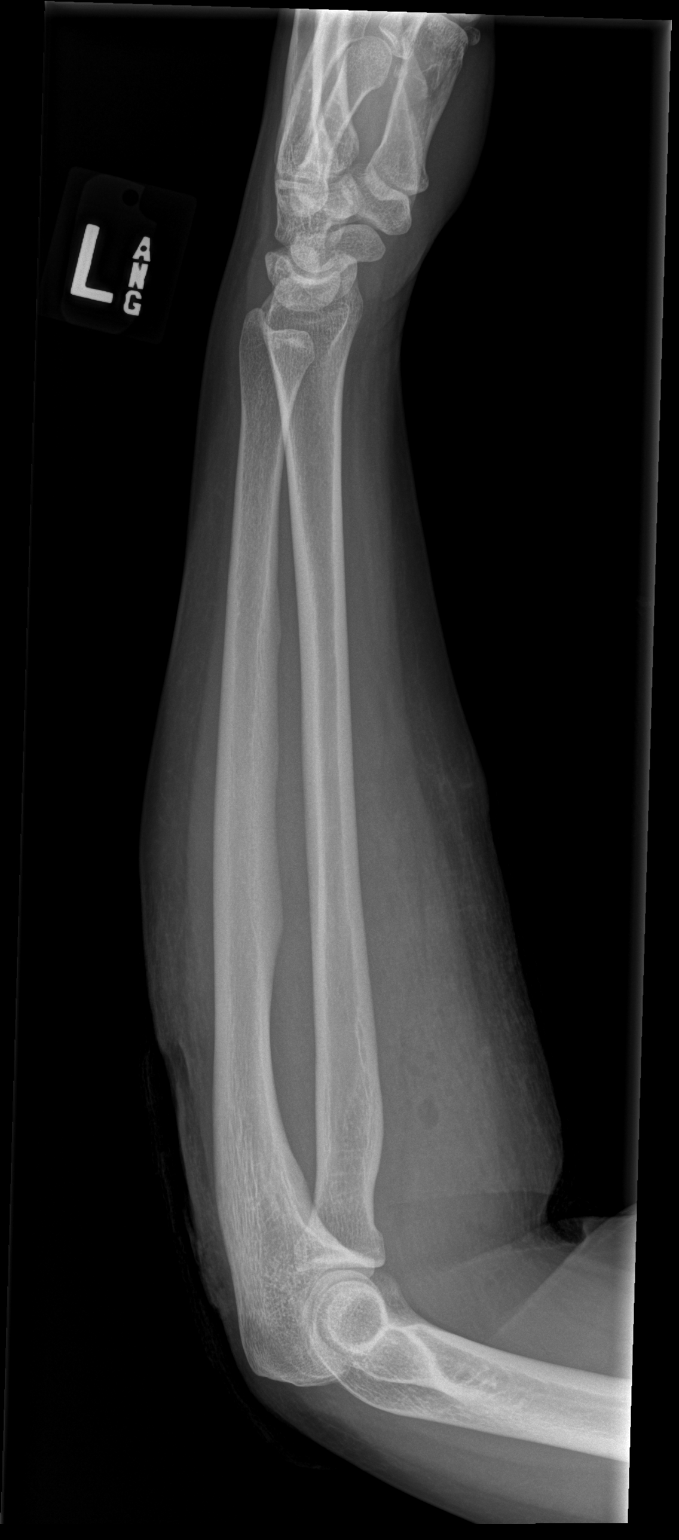

[2 of 2 positions shown; findings below may reference images not displayed]

FINDINGS: Dressings are identified at just below the elbow. Air in the soft
tissues consistent with reported dog bite. No foreign bodies are
identified. No fractures are seen.
IMPRESSION: Air in the soft tissues consistent with dog bite. No fracture,
dislocation, or foreign body.

## 2022-04-03 ENCOUNTER — Ambulatory Visit (INDEPENDENT_AMBULATORY_CARE_PROVIDER_SITE_OTHER): Payer: Medicaid Other | Admitting: Family Medicine

## 2022-04-03 ENCOUNTER — Ambulatory Visit (HOSPITAL_COMMUNITY)
Admission: RE | Admit: 2022-04-03 | Discharge: 2022-04-03 | Disposition: A | Discharge status: Home / Self Care | Payer: Medicaid Other | Source: Ambulatory Visit | Attending: Family Medicine | Admitting: Family Medicine

## 2022-04-03 DIAGNOSIS — R042 Hemoptysis: Secondary | ICD-10-CM | POA: Insufficient documentation

## 2022-04-03 DIAGNOSIS — Z8709 Personal history of other diseases of the respiratory system: Secondary | ICD-10-CM | POA: Diagnosis not present

## 2022-04-03 DIAGNOSIS — R059 Cough, unspecified: Secondary | ICD-10-CM

## 2022-04-03 MED ORDER — DULERA 200-5 MCG/ACT IN AERO: 2.0000 | INHALATION_SPRAY | Freq: Two times a day (BID) | RESPIRATORY_TRACT | 2 refills | Status: DC

## 2022-04-03 MED ORDER — ALBUTEROL SULFATE (2.5 MG/3ML) 0.083% IN NEBU: 2.5000 mg | INHALATION_SOLUTION | Freq: Four times a day (QID) | RESPIRATORY_TRACT | 1 refills | Status: DC | PRN

## 2022-04-03 MED ORDER — OSELTAMIVIR PHOSPHATE 75 MG PO CAPS: 75.0000 mg | ORAL_CAPSULE | Freq: Two times a day (BID) | ORAL | 0 refills | Status: AC

## 2022-04-03 NOTE — Progress Notes (Unsigned)
    SUBJECTIVE:   CHIEF COMPLAINT / HPI:   Had fever 101.2 yesterday morning. Boyfriend had the Flu, as well as son. Symptoms started Wednesday night. Cough, congestion, rhinorrhea. Headache and body aches. Decreased appetite able to keep hydrated. States she coughed up blood in morning. Mucous looks red and green. Has not had to increase inhaler use. Had pneumonia and hospitalization in September.  Positive pregnancy test on the 03/05/22. Initial OB visit at [facility] next week 04/08/22.  Contractions?  Vaginal Bleeding? Loss of fluids?  PERTINENT  PMH / PSH: Asthma, Migraines  OBJECTIVE:   LMP 01/28/2022 (Approximate)   ***  ASSESSMENT/PLAN:   No problem-specific Assessment & Plan notes found for this encounter.     Salvadore Oxford, MD [place]

## 2022-04-03 NOTE — Patient Instructions (Signed)
It was great to see you! Thank you for allowing me to participate in your care!  I recommend that you always bring your medications to each appointment as this makes it easy to ensure we are on the correct medications and helps [place] not miss when refills are needed.  Our plans for today:  - Please go across the street to [facility] get a chest x ray. - I have sent your inhalers and Tamiflu to your pharmacy. - Please return or call if any of the issues we discussed happen.   Take care and seek immediate care sooner if you develop any concerns.   Dr. Salvadore Oxford, MD [place]

## 2022-04-04 DIAGNOSIS — R042 Hemoptysis: Secondary | ICD-10-CM | POA: Insufficient documentation

## 2022-04-04 NOTE — Assessment & Plan Note (Signed)
Refilled inhalers. Given high risk for respiratory complications while pregnant, Dulera okay to continue at this time in setting of acute viral illness. Consider stopping once viral illness resolves. Patient not requiring increased use of inhalers at this time.

## 2022-04-04 NOTE — Assessment & Plan Note (Signed)
Patient has exposure to Influenza through boyfriend and son, both reportedly tested positive, and patient has classic Flu symptoms. Patient is likely positive. Patient is at high risk for complication due to hx of multifocal pneumonia requiring hospital admission in Sept 2023, hx of asthma, and recently pregnant estimated at around 9 weeks based on LMP. Clinically today patient is stable without wheezing, difficulty breathing, or signs of dehydration and saturating well on room air. However, she does endorse small hemoptysis when she coughs which is concerning for underlying new pneumonia given history. It is likely, patient's lungs are still recovering since hospitalization and viral irration causing hemoptysis but given patient's high risk will error on side of caution. Plan to start Tamiflu empirically, COVID and Flu test sent, and CXR. Strict return precautions addressed. Patient has initial OB visit at [facility] next week.

## 2022-04-05 LAB — COVID-19, FLU A+B NAA
Influenza A, NAA: NOT DETECTED
Influenza B, NAA: NOT DETECTED
SARS-CoV-2, NAA: NOT DETECTED

## 2022-04-13 ENCOUNTER — Other Ambulatory Visit (HOSPITAL_COMMUNITY)
Admission: RE | Admit: 2022-04-13 | Discharge: 2022-04-13 | Disposition: A | Discharge status: Home / Self Care | Payer: Medicaid Other | Source: Ambulatory Visit | Attending: Obstetrics and Gynecology | Admitting: Obstetrics and Gynecology

## 2022-04-13 ENCOUNTER — Ambulatory Visit (INDEPENDENT_AMBULATORY_CARE_PROVIDER_SITE_OTHER): Payer: Medicaid Other

## 2022-04-13 ENCOUNTER — Other Ambulatory Visit: Payer: Self-pay | Admitting: Obstetrics and Gynecology

## 2022-04-13 VITALS — BP 117/73 | HR 71 | Ht 63.0 in | Wt 232.6 lb

## 2022-04-13 DIAGNOSIS — Z3403 Encounter for supervision of normal first pregnancy, third trimester: Secondary | ICD-10-CM

## 2022-04-13 DIAGNOSIS — O3680X0 Pregnancy with inconclusive fetal viability, not applicable or unspecified: Secondary | ICD-10-CM

## 2022-04-13 DIAGNOSIS — Z3A10 10 weeks gestation of pregnancy: Secondary | ICD-10-CM | POA: Diagnosis not present

## 2022-04-13 DIAGNOSIS — Z1332 Encounter for screening for maternal depression: Secondary | ICD-10-CM | POA: Diagnosis not present

## 2022-04-13 DIAGNOSIS — Z3480 Encounter for supervision of other normal pregnancy, unspecified trimester: Secondary | ICD-10-CM | POA: Diagnosis not present

## 2022-04-13 MED ORDER — GOJJI WEIGHT SCALE MISC: 1.0000 | 0 refills | Status: DC

## 2022-04-13 MED ORDER — VITAFOL GUMMIES 3.33-0.333-34.8 MG PO CHEW: 3.0000 | CHEWABLE_TABLET | Freq: Every day | ORAL | 11 refills | Status: DC

## 2022-04-13 NOTE — Progress Notes (Signed)
New OB Intake  I connected with Guy Sandifer  on 04/13/22 at  3:10 PM EST by in person and verified that I am speaking with the correct person using two identifiers. Nurse is located at [facility] and pt is located at [place].  I discussed the limitations, risks, security and privacy concerns of performing an evaluation and management service by telephone and the availability of in person appointments. I also discussed with the patient that there may be a patient responsible charge related to this service. The patient expressed understanding and agreed to proceed.  I explained I am completing New OB Intake today. We discussed EDD of 11/04/22 that is based on LMP of 01/28/22. Pt is G2/P1001. I reviewed her allergies, medications, Medical/Surgical/OB history, and appropriate screenings. I informed her of [facility] services. [facility] information placed in AVS. Based on history, this is a low risk pregnancy.  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 Patient given information for [facility] Healthy Baby website for more information about Women's and [place]. Patient is not interested in water birth. Offered  upcoming OB visit with CNM to discuss further.  MyChart/Babyscripts MyChart access verified. I explained pt will have some visits in office and some virtually. Babyscripts instructions given and order placed. Patient verifies receipt of registration text/e-mail. Account successfully created and app downloaded.  Blood Pressure Cuff/Weight Scale Patient has BP cuff from previous pregnancy. Explained after first prenatal appt pt will check weekly and document in 8. Patient does not have weight scale; order sent to [place], patient may track weight weekly in Babyscripts.  Anatomy US Explained first scheduled [place] will be around 19 weeks. Dating and viability u/s performed today. Anatomy US TBD.  Labs Discussed Johnsie Cancel genetic screening with patient. Would like both Panorama and Horizon drawn at new OB visit. Routine prenatal labs needed.  COVID Vaccine Patient has not had COVID vaccine.   Is patient a CenteringPregnancy candidate?  Declined Declined due to Group setting Not a candidate due to  If accepted,   Social Determinants of Health Food Insecurity: Patient denies food insecurity. WIC Referral: Patient is interested in referral to [facility].  Transportation: Patient denies transportation needs. Childcare: Discussed no children allowed at ultrasound appointments. Offered childcare services; patient declines childcare services at this time.  First visit review I reviewed new OB appt with patient. I explained they will have a provider visit that includes discussing plan of care for pregnancy. Explained pt will be seen by TBD at first visit; encounter routed to appropriate provider. Explained that patient will be seen by pregnancy navigator following visit with provider.   Lucianne Lei, RN 04/13/2022  3:18 PM

## 2022-04-14 LAB — CBC/D/PLT+RPR+RH+ABO+RUBIGG...
Antibody Screen: NEGATIVE
Basophils Absolute: 0 10*3/uL (ref 0.0–0.2)
Basos: 0 %
EOS (ABSOLUTE): 0.4 10*3/uL (ref 0.0–0.4)
Eos: 4 %
HCV Ab: NONREACTIVE
HIV Screen 4th Generation wRfx: NONREACTIVE
Hematocrit: 38.4 % (ref 34.0–46.6)
Hemoglobin: 13 g/dL (ref 11.1–15.9)
Hepatitis B Surface Ag: NEGATIVE
Immature Grans (Abs): 0 10*3/uL (ref 0.0–0.1)
Immature Granulocytes: 0 %
Lymphocytes Absolute: 2.7 10*3/uL (ref 0.7–3.1)
Lymphs: 27 %
MCH: 28 pg (ref 26.6–33.0)
MCHC: 33.9 g/dL (ref 31.5–35.7)
MCV: 83 fL (ref 79–97)
Monocytes Absolute: 0.4 10*3/uL (ref 0.1–0.9)
Monocytes: 4 %
Neutrophils Absolute: 6.4 10*3/uL (ref 1.4–7.0)
Neutrophils: 65 %
Platelets: 228 10*3/uL (ref 150–450)
RBC: 4.64 x10E6/uL (ref 3.77–5.28)
RDW: 14.1 % (ref 11.7–15.4)
RPR Ser Ql: NONREACTIVE
Rh Factor: POSITIVE
Rubella Antibodies, IGG: 5.23 index (ref >0.99)
WBC: 9.9 10*3/uL (ref 3.4–10.8)

## 2022-04-14 LAB — HCV INTERPRETATION

## 2022-04-15 LAB — CERVICOVAGINAL ANCILLARY ONLY
Chlamydia: NEGATIVE
Comment: NEGATIVE
Comment: NEGATIVE
Comment: NORMAL
Neisseria Gonorrhea: NEGATIVE
Trichomonas: NEGATIVE

## 2022-04-15 LAB — CULTURE, OB URINE

## 2022-04-15 LAB — URINE CULTURE, OB REFLEX

## 2022-04-22 LAB — PANORAMA PRENATAL TEST FULL PANEL:PANORAMA TEST PLUS 5 ADDITIONAL MICRODELETIONS: FETAL FRACTION: 6.5

## 2022-04-27 ENCOUNTER — Ambulatory Visit (INDEPENDENT_AMBULATORY_CARE_PROVIDER_SITE_OTHER): Payer: Medicaid Other | Admitting: Student

## 2022-04-27 VITALS — BP 120/75 | HR 93 | Ht 63.0 in | Wt 233.4 lb

## 2022-04-27 DIAGNOSIS — J029 Acute pharyngitis, unspecified: Secondary | ICD-10-CM | POA: Diagnosis not present

## 2022-04-27 LAB — POCT RAPID STREP A (OFFICE): Rapid Strep A Screen: NEGATIVE

## 2022-04-27 MED ORDER — PENICILLIN V POTASSIUM 500 MG PO TABS: 500.0000 mg | ORAL_TABLET | Freq: Two times a day (BID) | ORAL | 0 refills | Status: AC

## 2022-04-27 MED ORDER — ALBUTEROL SULFATE (SENSOR) 108 (90 BASE) MCG/ACT IN AEPB: INHALATION_SPRAY | RESPIRATORY_TRACT | 0 refills | Status: DC

## 2022-04-27 NOTE — Assessment & Plan Note (Signed)
Group A strep swab negative in office, exudates and anterior cervical lymphadenopathy on exam.  Centor score of 2. will send for culture today and presumptively treat for strep infection and call patient if strep swab is negative.  Lower likelihood of pneumonia given patient saturating well on room air and does not have any increased work of breathing and last chest x-ray without any signs of pneumonia.  Most likely this is viral/ post viral upper respiratory infection.  No wheezing on examination, less likely to be asthma exacerbation. -Penicillin V twice daily for 10 days, will call if culture is negative -Albuterol inhaler refilled

## 2022-04-27 NOTE — Patient Instructions (Addendum)
It was great to see you! Thank you for allowing me to participate in your care!   Our plans for today:  -Your strep swab was negative however I will send in this for a culture.  I want to go ahead and treat you starting now for possible strep infection and if the culture returns negative I will let she know to stop this medication -I refilled your albuterol -Please do Flonase to help with nasal congestion and sinus pain -Please do salt water gargles as well to help with clearing her throat and make sure to rinse your throat after doing inhalers -return if not improved or having fevers  Take care and seek immediate care sooner if you develop any concerns.  Gerrit Heck, MD

## 2022-04-27 NOTE — Progress Notes (Signed)
    SUBJECTIVE:   CHIEF COMPLAINT / HPI: Sore throat/cough  Patient had a cough that started around 04/02/2022.  At that time got viral swab which was negative however she did have exposure to flu from her partner and son. Chest x-ray obtained which did not show any pneumonia.  Since then she had gotten better and then a few days ago started feeling worse with body aches and body pain and pressure in her ears.  She said that last night she was covered in sweat but she did not have any fevers.  She was nauseous and threw up 6 times yesterday-she is [redacted] weeks pregnant at this time.  She says that she is coughing up dark green mucus.  She also noticed some spots in the back of her throat.  She has been using her Dulera at night.  Has been out of her albuterol inhaler.   PERTINENT  PMH / PSH: hx of multifocal PNA sept 2023,   OBJECTIVE:   BP 120/75   Pulse 93   Ht 5\' 3"  (1.6 m)   Wt 233 lb 6 oz (105.9 kg)   LMP 01/28/2022 (Approximate)   SpO2 99%   BMI 41.34 kg/m   General: Nontoxic, NAD, awake, alert, responsive to questions Head: Normocephalic atraumatic, some tenderness to palpation of maxillary sinus, nasal congestion present, TMs and canals clear bilaterally, tender anterior cervical lymphadenopathy, white oropharyngeal exudates present on left side CV: Regular rate and rhythm no murmurs rubs or gallops Respiratory: Clear to ausculation bilaterally, no wheezes rales or crackles, chest rises symmetrically,  no increased work of breathing on room air, productive cough present Abdomen: Soft, non-tender, non-distended, normoactive bowel sounds  Extremities: Moves upper and lower extremities freely, no edema in LE Neuro: No focal deficits Skin: No rashes or lesions visualized   ASSESSMENT/PLAN:   Sore throat Group A strep swab negative in office, exudates and anterior cervical lymphadenopathy on exam.  Centor score of 2. will send for culture today and presumptively treat for strep infection  and call patient if strep swab is negative.  Lower likelihood of pneumonia given patient saturating well on room air and does not have any increased work of breathing and last chest x-ray without any signs of pneumonia.  Most likely this is viral/ post viral upper respiratory infection.  No wheezing on examination, less likely to be asthma exacerbation. -Penicillin V twice daily for 10 days, will call if culture is negative -Albuterol inhaler refilled    Gerrit Heck, MD [place]

## 2022-04-30 LAB — CULTURE, GROUP A STREP: Strep A Culture: NEGATIVE

## 2022-05-01 ENCOUNTER — Other Ambulatory Visit (HOSPITAL_COMMUNITY): Payer: Self-pay

## 2022-05-01 ENCOUNTER — Telehealth: Payer: Self-pay

## 2022-05-01 NOTE — Telephone Encounter (Signed)
Rec'd fax from patients pharmacy regarding inhaler.  Patients insurance covers ventolin inhaler. Can a new rx be sent in?

## 2022-05-01 NOTE — Telephone Encounter (Signed)
Pharmacy returns call to nurse line. Patient had two profiles at the pharmacy. NO further action is needed. Please disregard previous message from [place].   Thanks.   Talbot Grumbling, RN

## 2022-05-21 ENCOUNTER — Other Ambulatory Visit (HOSPITAL_COMMUNITY)
Admission: RE | Admit: 2022-05-21 | Discharge: 2022-05-21 | Disposition: A | Discharge status: Home / Self Care | Payer: Medicaid Other | Source: Ambulatory Visit | Attending: Obstetrics | Admitting: Obstetrics

## 2022-05-21 ENCOUNTER — Encounter: Payer: Self-pay | Admitting: Obstetrics

## 2022-05-21 ENCOUNTER — Ambulatory Visit (INDEPENDENT_AMBULATORY_CARE_PROVIDER_SITE_OTHER): Payer: Medicaid Other | Admitting: Obstetrics

## 2022-05-21 VITALS — BP 95/64 | HR 79 | Wt 235.0 lb

## 2022-05-21 DIAGNOSIS — Z3A16 16 weeks gestation of pregnancy: Secondary | ICD-10-CM

## 2022-05-21 DIAGNOSIS — O219 Vomiting of pregnancy, unspecified: Secondary | ICD-10-CM

## 2022-05-21 DIAGNOSIS — O99212 Obesity complicating pregnancy, second trimester: Secondary | ICD-10-CM | POA: Diagnosis not present

## 2022-05-21 DIAGNOSIS — O0990 Supervision of high risk pregnancy, unspecified, unspecified trimester: Secondary | ICD-10-CM | POA: Diagnosis not present

## 2022-05-21 DIAGNOSIS — G43009 Migraine without aura, not intractable, without status migrainosus: Secondary | ICD-10-CM

## 2022-05-21 DIAGNOSIS — J45909 Unspecified asthma, uncomplicated: Secondary | ICD-10-CM

## 2022-05-21 DIAGNOSIS — O99210 Obesity complicating pregnancy, unspecified trimester: Secondary | ICD-10-CM

## 2022-05-21 DIAGNOSIS — Z3480 Encounter for supervision of other normal pregnancy, unspecified trimester: Secondary | ICD-10-CM

## 2022-05-21 MED ORDER — VITAFOL ULTRA 29-0.6-0.4-200 MG PO CAPS: 1.0000 | ORAL_CAPSULE | Freq: Every day | ORAL | 4 refills | Status: DC

## 2022-05-21 MED ORDER — DOXYLAMINE-PYRIDOXINE 10-10 MG PO TBEC: DELAYED_RELEASE_TABLET | ORAL | 5 refills | Status: DC

## 2022-05-21 NOTE — Progress Notes (Signed)
NOB, reports no concerns today.

## 2022-05-21 NOTE — Progress Notes (Signed)
Subjective:    Gloria Cox is being seen today for her first obstetrical visit.  This is not a planned pregnancy. She is at 41w1dgestation. Her obstetrical history is significant for obesity. Relationship with FOB: significant other, not living together. Patient does intend to breast feed. Pregnancy history fully reviewed.  The information documented in the HPI was reviewed and verified.  Menstrual History: OB History     Gravida  2   Para  1   Term  1   Preterm      AB      Living  1      SAB      IAB      Ectopic      Multiple  0   Live Births  1            Patient's last menstrual period was 01/28/2022 (approximate).    Past Medical History:  Diagnosis Date   Asthma    Dyspepsia    Goiter    Hypothyroidism    [place]    Pre-diabetes    Pre-diabetes    [place]    Thyroiditis, autoimmune     Past Surgical History:  Procedure Laterality Date   CESAREAN SECTION N/A 09/06/2021   Procedure: CESAREAN SECTION;  Surgeon: PAletha Halim MD;  Location: [facility];  Service: Obstetrics;  Laterality: N/A;   CHOLECYSTECTOMY N/A 12/18/2015   Procedure: LAPAROSCOPIC CHOLECYSTECTOMY;  Surgeon: MRolm Bookbinder MD;  Location: [facility] ORS;  Service: General;  Laterality: N/A;   WISDOM TOOTH EXTRACTION  03/30/2012    (Not in a hospital admission)  No Known Allergies  Social History   Tobacco Use   Smoking status: Former    Packs/day: 0.10    Types: Cigarettes    Quit date: 03/09/2018    Years since quitting: 4.2   Smokeless tobacco: Never  Substance Use Topics   Alcohol use: Not Currently    Comment: not since confirmed pregnancy    Family History  Problem Relation Age of Onset   Obesity Mother    Hypertension Father    Stroke Father    Obesity Sister    Obesity Brother    Diabetes Maternal Grandmother    Hypertension Maternal Grandmother    Obesity Maternal Grandmother    Diabetes Maternal Grandfather    Hypertension Maternal Grandfather     Heart Problems Maternal Grandfather [phone]  Diabetes Paternal Grandmother    Hypertension Paternal Grandmother    Diabetes Paternal Grandfather    Hypertension Paternal Grandfather      Review of Systems Constitutional: negative for weight loss Gastrointestinal: negative for vomiting Genitourinary:negative for genital lesions and vaginal discharge and dysuria Musculoskeletal:negative for back pain Behavioral/Psych: negative for abusive relationship, depression, illegal drug usage and tobacco use    Objective:    BP 95/64   Pulse 79   Wt 235 lb (106.6 kg)   LMP 01/28/2022 (Approximate)   BMI 41.63 kg/m  General Appearance:    Alert, cooperative, no distress, appears stated age  Head:    Normocephalic, without obvious abnormality, atraumatic  Eyes:    PERRL, conjunctiva/corneas clear, EOM's intact, fundi    benign, both eyes  Ears:    Normal TM's and external ear canals, both ears  Nose:   Nares normal, septum midline, mucosa normal, no drainage    or sinus tenderness  Throat:   Lips, mucosa, and tongue normal; teeth and gums normal  Neck:   Supple, symmetrical, trachea midline, no  adenopathy;    thyroid:  no enlargement/tenderness/nodules; no carotid   bruit or JVD  Back:     Symmetric, no curvature, ROM normal, no CVA tenderness  Lungs:     Clear to auscultation bilaterally, respirations unlabored  Chest Wall:    No tenderness or deformity   Heart:    Regular rate and rhythm, S1 and S2 normal, no murmur, rub   or gallop  Breast Exam:    No tenderness, masses, or nipple abnormality  Abdomen:     Soft, non-tender, bowel sounds active all four quadrants,    no masses, no organomegaly  Genitalia:    Normal female without lesion, discharge or tenderness  Extremities:   Extremities normal, atraumatic, no cyanosis or edema  Pulses:   2+ and symmetric all extremities  Skin:   Skin color, texture, turgor normal, no rashes or lesions  Lymph nodes:   Cervical, supraclavicular, and  axillary nodes normal  Neurologic:   CNII-XII intact, normal strength, sensation and reflexes    throughout      Lab Review Urine pregnancy test Labs reviewed yes Radiologic studies reviewed yes  Assessment:    Pregnancy at 85w1dweeks    Plan:    1. Supervision of other normal pregnancy, antepartum Rx: - Cytology - PAP( [place]) - AFP, Serum, Open Spina Bifida - TSH - Prenat-Fe Poly-Methfol-FA-DHA (VITAFOL ULTRA) 29-0.6-0.4-200 MG CAPS; Take 1 capsule by mouth daily before breakfast.  Dispense: 90 capsule; Refill: 4 - Hemoglobin A1c - [place] OB DETAIL +14 WK; Future  2. Nausea and vomiting in pregnancy Rx: - Doxylamine-Pyridoxine (DICLEGIS) 10-10 MG TBEC; 1 tab in AM, 1 tab mid afternoon 2 tabs at bedtime. Max dose 4 tabs daily.  Dispense: 100 tablet; Refill: 5  3. Uncomplicated asthma, unspecified asthma severity, unspecified whether persistent - clinically stable  4. Migraine without aura and without status migrainosus, not intractable Rx: - Ambulatory referral to Neurology  5. Obesity in pregnancy    Prenatal vitamins.  Counseling provided regarding continued use of seat belts, cessation of alcohol consumption, smoking or use of illicit drugs; infection precautions i.e., influenza/TDAP immunizations, toxoplasmosis,CMV, parvovirus, listeria and varicella; workplace safety, exercise during pregnancy; routine dental care, safe medications, sexual activity, hot tubs, saunas, pools, travel, caffeine use, fish and methlymercury, potential toxins, hair treatments, varicose veins Weight gain recommendations per IOM guidelines reviewed: underweight/BMI< 18.5--> gain 28 - 40 lbs; normal weight/BMI 18.5 - 24.9--> gain 25 - 35 lbs; overweight/BMI 25 - 29.9--> gain 15 - 25 lbs; obese/BMI >30->gain  11 - 20 lbs Problem list reviewed and updated. FIRST/CF mutation testing/NIPT/QUAD SCREEN/fragile X/Ashkenazi Jewish population testing/Spinal muscular atrophy discussed:  requested. Role of ultrasound in pregnancy discussed; fetal survey: requested. Amniocentesis discussed: not indicated.  Meds ordered this encounter  Medications   Doxylamine-Pyridoxine (DICLEGIS) 10-10 MG TBEC    Sig: 1 tab in AM, 1 tab mid afternoon 2 tabs at bedtime. Max dose 4 tabs daily.    Dispense:  100 tablet    Refill:  5   Prenat-Fe Poly-Methfol-FA-DHA (VITAFOL ULTRA) 29-0.6-0.4-200 MG CAPS    Sig: Take 1 capsule by mouth daily before breakfast.    Dispense:  90 capsule    Refill:  4   Orders Placed This Encounter  Procedures   [facility] OB DETAIL +14 WK    Standing Status:   Future    Standing Expiration Date:   05/22/2023    Order Specific Question:   Reason for Exam (SYMPTOM  OR DIAGNOSIS  REQUIRED)    Answer:   Anatomy    Order Specific Question:   Preferred Location    Answer:   [facility] Ultrasound   AFP, Serum, Open Spina Bifida    Order Specific Question:   Is patient insulin dependent?    Answer:   No    Order Specific Question:   Gestational Age (GA), weeks    Answer:   50.1    Order Specific Question:   Date on which patient was at this [place]    Answer:   05/21/2022    Order Specific Question:   GA Calculation Method    Answer:   LMP    Order Specific Question:   Number of fetuses    Answer:   1    Order Specific Question:   Donor egg?    Answer:   N   TSH   Hemoglobin A1c   Ambulatory referral to Neurology    Referral Priority:   Routine    Referral Type:   Consultation    Referral Reason:   Specialty Services Required    Requested Specialty:   Neurology    Number of Visits Requested:   1    Follow up in 4 weeks.  I have spent a total of 25 minutes of face-to-face time, excluding clinical staff time, reviewing notes and preparing to see patient, ordering tests and/or medications, and counseling the patient.   Shelly Bombard, MD 05/21/2022 2:14 PM

## 2022-05-22 ENCOUNTER — Encounter: Payer: Self-pay | Admitting: General Practice

## 2022-05-22 LAB — HEMOGLOBIN A1C
Est. average glucose Bld gHb Est-mCnc: 108 mg/dL
Hgb A1c MFr Bld: 5.4 % (ref 4.8–5.6)

## 2022-05-22 LAB — TSH: TSH: 1.1 u[IU]/mL (ref 0.450–4.500)

## 2022-05-25 LAB — AFP, SERUM, OPEN SPINA BIFIDA
AFP MoM: 1.13
AFP Value: 29.8 ng/mL
Gest. Age on Collection Date: 16.1 weeks
Maternal Age At EDD: 25.4 yr
OSBR Risk 1 IN: 8106
Test Results:: NEGATIVE
Weight: 232 [lb_av]

## 2022-05-26 LAB — CYTOLOGY - PAP: Diagnosis: NEGATIVE

## 2022-05-28 ENCOUNTER — Emergency Department (HOSPITAL_COMMUNITY): Payer: Medicaid Other

## 2022-05-28 ENCOUNTER — Emergency Department (HOSPITAL_COMMUNITY)
Admission: EM | Admit: 2022-05-28 | Discharge: 2022-05-28 | Disposition: A | Discharge status: Home / Self Care | Payer: Medicaid Other | Attending: Emergency Medicine | Admitting: Emergency Medicine

## 2022-05-28 ENCOUNTER — Other Ambulatory Visit: Payer: Self-pay

## 2022-05-28 DIAGNOSIS — K529 Noninfective gastroenteritis and colitis, unspecified: Secondary | ICD-10-CM | POA: Diagnosis not present

## 2022-05-28 DIAGNOSIS — O99282 Endocrine, nutritional and metabolic diseases complicating pregnancy, second trimester: Secondary | ICD-10-CM | POA: Diagnosis not present

## 2022-05-28 DIAGNOSIS — E860 Dehydration: Secondary | ICD-10-CM | POA: Diagnosis not present

## 2022-05-28 DIAGNOSIS — Z3A17 17 weeks gestation of pregnancy: Secondary | ICD-10-CM | POA: Insufficient documentation

## 2022-05-28 DIAGNOSIS — O99612 Diseases of the digestive system complicating pregnancy, second trimester: Secondary | ICD-10-CM | POA: Insufficient documentation

## 2022-05-28 DIAGNOSIS — O219 Vomiting of pregnancy, unspecified: Secondary | ICD-10-CM | POA: Diagnosis present

## 2022-05-28 LAB — URINALYSIS, ROUTINE W REFLEX MICROSCOPIC
Bilirubin Urine: NEGATIVE
Glucose, UA: NEGATIVE mg/dL
Hgb urine dipstick: NEGATIVE
Ketones, ur: 80 mg/dL — AB
Leukocytes,Ua: NEGATIVE
Nitrite: NEGATIVE
Protein, ur: 30 mg/dL — AB
Specific Gravity, Urine: 1.024 (ref 1.005–1.030)
pH: 5 (ref 5.0–8.0)

## 2022-05-28 LAB — CBC WITH DIFFERENTIAL/PLATELET
Abs Immature Granulocytes: 0.03 10*3/uL (ref 0.00–0.07)
Basophils Absolute: 0 10*3/uL (ref 0.0–0.1)
Basophils Relative: 0 %
Eosinophils Absolute: 0.1 10*3/uL (ref 0.0–0.5)
Eosinophils Relative: 1 %
HCT: 40.2 % (ref 36.0–46.0)
Hemoglobin: 13.5 g/dL (ref 12.0–15.0)
Immature Granulocytes: 0 %
Lymphocytes Relative: 14 %
Lymphs Abs: 1.1 10*3/uL (ref 0.7–4.0)
MCH: 29.2 pg (ref 26.0–34.0)
MCHC: 33.6 g/dL (ref 30.0–36.0)
MCV: 86.8 fL (ref 80.0–100.0)
Monocytes Absolute: 0.3 10*3/uL (ref 0.1–1.0)
Monocytes Relative: 4 %
Neutro Abs: 6.4 10*3/uL (ref 1.7–7.7)
Neutrophils Relative %: 81 %
Platelets: 190 10*3/uL (ref 150–400)
RBC: 4.63 MIL/uL (ref 3.87–5.11)
RDW: 13.7 % (ref 11.5–15.5)
WBC: 7.9 10*3/uL (ref 4.0–10.5)
nRBC: 0 % (ref 0.0–0.2)

## 2022-05-28 LAB — COMPREHENSIVE METABOLIC PANEL
ALT: 16 U/L (ref 0–44)
AST: 17 U/L (ref 15–41)
Albumin: 3.4 g/dL — ABNORMAL LOW (ref 3.5–5.0)
Alkaline Phosphatase: 74 U/L (ref 38–126)
Anion gap: 9 (ref 5–15)
BUN: 5 mg/dL — ABNORMAL LOW (ref 6–20)
CO2: 20 mmol/L — ABNORMAL LOW (ref 22–32)
Calcium: 8.7 mg/dL — ABNORMAL LOW (ref 8.9–10.3)
Chloride: 104 mmol/L (ref 98–111)
Creatinine, Ser: 0.52 mg/dL (ref 0.44–1.00)
GFR, Estimated: >60 mL/min (ref >60)
Glucose, Bld: 99 mg/dL (ref 70–99)
Potassium: 3.3 mmol/L — ABNORMAL LOW (ref 3.5–5.1)
Sodium: 133 mmol/L — ABNORMAL LOW (ref 135–145)
Total Bilirubin: 0.7 mg/dL (ref 0.3–1.2)
Total Protein: 7.5 g/dL (ref 6.5–8.1)

## 2022-05-28 LAB — LIPASE, BLOOD: Lipase: 27 U/L (ref 11–51)

## 2022-05-28 MED ORDER — SODIUM CHLORIDE 0.9 % IV BOLUS: 1000.0000 mL | Freq: Once | INTRAVENOUS | Status: DC

## 2022-05-28 MED ORDER — SODIUM CHLORIDE 0.9 % IV BOLUS
1000.0000 mL | Freq: Once | INTRAVENOUS | Status: AC
  Administered 2022-05-28: 1000 mL via INTRAVENOUS

## 2022-05-28 MED ORDER — DOXYLAMINE SUCCINATE (SLEEP) 25 MG PO TABS
12.5000 mg | ORAL_TABLET | Freq: Once | ORAL | Status: AC
  Administered 2022-05-28: 12.5 mg via ORAL
  Filled 2022-05-28: qty 1

## 2022-05-28 MED ORDER — PYRIDOXINE HCL 100 MG/ML IJ SOLN
30.0000 mg | Freq: Once | INTRAMUSCULAR | Status: AC
  Administered 2022-05-28: 30 mg via INTRAVENOUS
  Filled 2022-05-28: qty 0.3

## 2022-05-28 MED ORDER — POTASSIUM CHLORIDE CRYS ER 20 MEQ PO TBCR
40.0000 meq | EXTENDED_RELEASE_TABLET | Freq: Once | ORAL | Status: AC
  Administered 2022-05-28: 40 meq via ORAL
  Filled 2022-05-28: qty 2

## 2022-05-28 NOTE — ED Triage Notes (Signed)
Pt presents with n/v/d x 2 days.  States she is [redacted] weeks pregnant.  Has not had any trouble with morning sickness and feels this is more of a "stomach bug"  States her OB told her to come to the ED for eval.  Diffuse abdominal cramping as well.

## 2022-05-28 NOTE — Discharge Instructions (Signed)
Thank you for letting us take care of you today.  Your workup today including your ultrasound was reassuring. Your potassium level was slightly low so we gave you medication to replace this in the ED. You likely have a viral syndrome exacerbating your morning sickness during this pregnancy.  Please follow up with your OB/GYN if your symptoms continue over the next few days. Please use the medication they prescribed to help with your nausea and vomiting. It is very important to stay well hydrated. I recommend taking the nausea medication about 30 minutes before your meals.  If you develop any new or worsening symptoms such as severe abdominal pain, vaginal bleeding or discharge, high fevers, inability to eat or drink, frequent vomiting uncontrolled with use of medications prescribed by your OB/GYN, or other concerns, please return to nearest emergency department for re-evaluation.

## 2022-05-28 NOTE — ED Provider Notes (Signed)
[place] EMERGENCY DEPARTMENT AT [facility] Provider Note   CSN: [id number] Arrival date & time: 05/28/22  1620     History {Add pertinent medical, surgical, social history, OB history to HPI:1} Chief Complaint  Patient presents with   Emesis    Gloria Cox is a 27 y.o. female who is currently [redacted] weeks pregnant who presents to the ED for 2 days of nausea, vomiting, and diarrhea.  Patient reports that she typically has 1-2 episodes of vomiting in the morning since becoming pregnant but since becoming ill 2 days ago she is having more episodes than she can count daily.  She contacted her OB who sent in a prescription for antiemetics but recommended that with her symptoms she comes to the ED for further evaluation.  Patient reports diffuse abdominal pain with symptoms but denies fever, chills, back pain, dysuria, pelvic pain, chest pain, shortness of breath, vaginal bleeding, vaginal discharge, or other symptoms at this time.  She has not been taking any antiemetics at home for symptoms.  She denies hematemesis, hematochezia, or melena.  She reports that she has had 2 prior miscarriages and 1 prior pregnancy that went to term.      Home Medications Prior to Admission medications   Medication Sig Start Date End Date Taking? Authorizing Provider  acetaminophen (TYLENOL) 500 MG tablet Take 2 tablets (1,000 mg total) by mouth every 6 (six) hours. 09/09/21   Renard Matter, MD  albuterol (PROVENTIL) (2.5 MG/3ML) 0.083% nebulizer solution Take 3 mLs (2.5 mg total) by nebulization every 6 (six) hours as needed for wheezing or shortness of breath. 04/03/22   Salvadore Oxford, MD  Albuterol Sulfate, sensor, 108 (90 Base) MCG/ACT AEPB Inhale 2 puffs every 4 hours by inhalation route. 04/27/22   Gerrit Heck, MD  budesonide-formoterol (SYMBICORT) 160-4.5 MCG/ACT inhaler Inhale 2 puffs into the lungs 2 (two) times daily. 01/12/22   Leavy Cella, RPH-CPP  Doxylamine-Pyridoxine (DICLEGIS)  10-10 MG TBEC 1 tab in AM, 1 tab mid afternoon 2 tabs at bedtime. Max dose 4 tabs daily. 05/21/22   Shelly Bombard, MD  EXCEDRIN MIGRAINE [id number] MG tablet Take 1 tablet by mouth every 6 (six) hours as needed for headache or migraine.    [provider]  HAILEY 24 FE 1-20 MG-MCG(24) tablet Take 1 tablet by mouth daily at 6 PM.    [provider]  Misc. Devices (GOJJI WEIGHT SCALE) MISC 1 Device by Does not apply route every 30 (thirty) days. 04/13/22   Constant, Peggy, MD  mometasone-formoterol (DULERA) 200-5 MCG/ACT AERO Inhale 2 puffs into the lungs 2 (two) times daily. Trial for three weeks 04/03/22   Salvadore Oxford, MD  [place] 10-5-325 MG CAPS Take 1 capsule by mouth every 6 (six) hours as needed (for congestion).    [provider]  Prenat-Fe Poly-Methfol-FA-DHA (VITAFOL ULTRA) 29-0.6-0.4-200 MG CAPS Take 1 capsule by mouth daily before breakfast. 05/21/22   Shelly Bombard, MD  Prenatal Vit-Fe Phos-FA-Omega (VITAFOL GUMMIES) 3.33-0.333-34.8 MG CHEW Chew 3 tablets by mouth daily. 04/13/22   Constant, Peggy, MD  budesonide (PULMICORT) 180 MCG/ACT inhaler Inhale 2 puffs into the lungs as needed.  06/14/11  [provider]      Allergies    Patient has no known allergies.    Review of Systems   Review of Systems  Gastrointestinal:  Positive for vomiting.    Physical Exam Updated Vital Signs BP 120/80 (BP Location: Right Arm)  Pulse 80   Temp 98 F (36.7 C) (Oral)   Resp 18   LMP 01/28/2022 (Approximate)   SpO2 100%  Physical Exam  ED Results / Procedures / Treatments   Labs (all labs ordered are listed, but only abnormal results are displayed) Labs Reviewed  COMPREHENSIVE METABOLIC PANEL - Abnormal; Notable for the following components:      Result Value   Sodium 133 (*)    Potassium 3.3 (*)    CO2 20 (*)    BUN 5 (*)    Calcium 8.7 (*)    Albumin 3.4 (*)    All other components within normal limits   URINALYSIS, ROUTINE W REFLEX MICROSCOPIC - Abnormal; Notable for the following components:   APPearance HAZY (*)    Ketones, ur 80 (*)    Protein, ur 30 (*)    Bacteria, UA FEW (*)    All other components within normal limits  CBC WITH DIFFERENTIAL/PLATELET  LIPASE, BLOOD    EKG None  Radiology No results found.  Procedures Procedures  {Document cardiac monitor, telemetry assessment procedure when appropriate:1}  Medications Ordered in ED Medications  sodium chloride 0.9 % bolus 1,000 mL (0 mLs Intravenous Stopped 05/28/22 2017)  doxylamine (Sleep) (UNISOM) tablet 12.5 mg (12.5 mg Oral Given 05/28/22 1735)  pyridOXINE (VITAMIN B6) injection 30 mg (30 mg Intravenous Given 05/28/22 1737)  potassium chloride SA (KLOR-CON M) CR tablet 40 mEq (40 mEq Oral Given 05/28/22 2013)    ED Course/ Medical Decision Making/ A&P   {   Click here for ABCD2, HEART and other calculatorsREFRESH Note before signing :1}                          Medical Decision Making Amount and/or Complexity of Data Reviewed Labs: ordered. Radiology: ordered.  Risk OTC drugs. Prescription drug management.   ***  {Document critical care time when appropriate:1} {Document review of labs and clinical decision tools ie heart score, Chads2Vasc2 etc:1}  {Document your independent review of radiology images, and any outside records:1} {Document your discussion with family members, caretakers, and with consultants:1} {Document social determinants of health affecting pt's care:1} {Document your decision making why or why not admission, treatments were needed:1} Final Clinical Impression(s) / ED Diagnoses Final diagnoses:  Gastroenteritis  Dehydration    Rx / DC Orders ED Discharge Orders     None

## 2022-05-30 ENCOUNTER — Other Ambulatory Visit: Payer: Self-pay | Admitting: Student

## 2022-06-16 ENCOUNTER — Encounter: Payer: Self-pay | Admitting: Student

## 2022-06-18 ENCOUNTER — Encounter: Payer: Self-pay | Admitting: Obstetrics and Gynecology

## 2022-06-18 ENCOUNTER — Ambulatory Visit (INDEPENDENT_AMBULATORY_CARE_PROVIDER_SITE_OTHER): Payer: Medicaid Other | Admitting: Obstetrics and Gynecology

## 2022-06-18 VITALS — BP 120/77 | HR 71 | Wt 233.0 lb

## 2022-06-18 DIAGNOSIS — Z6841 Body Mass Index (BMI) 40.0 and over, adult: Secondary | ICD-10-CM

## 2022-06-18 DIAGNOSIS — F419 Anxiety disorder, unspecified: Secondary | ICD-10-CM

## 2022-06-18 DIAGNOSIS — Z3482 Encounter for supervision of other normal pregnancy, second trimester: Secondary | ICD-10-CM

## 2022-06-18 DIAGNOSIS — Z3480 Encounter for supervision of other normal pregnancy, unspecified trimester: Secondary | ICD-10-CM

## 2022-06-18 DIAGNOSIS — Z3A20 20 weeks gestation of pregnancy: Secondary | ICD-10-CM

## 2022-06-18 DIAGNOSIS — Z8709 Personal history of other diseases of the respiratory system: Secondary | ICD-10-CM

## 2022-06-18 DIAGNOSIS — Z98891 History of uterine scar from previous surgery: Secondary | ICD-10-CM

## 2022-06-18 MED ORDER — BUDESONIDE-FORMOTEROL FUMARATE 160-4.5 MCG/ACT IN AERO: 2.0000 | INHALATION_SPRAY | Freq: Two times a day (BID) | RESPIRATORY_TRACT | 3 refills | Status: DC

## 2022-06-18 NOTE — Progress Notes (Signed)
   PRENATAL VISIT NOTE  Subjective:  Gloria Cox is a 27 y.o. G2P1001 at [redacted]w[redacted]d being seen today for ongoing prenatal care.  She is currently monitored for the following issues for this high-risk pregnancy and has Hyperlipemia; Macromastia; Migraine without aura and without status migrainosus, not intractable; HSV-1 (herpes simplex virus 1) infection; Obesity in pregnancy; History of asthma; S/P cesarean section; Tobacco use; and Supervision of other normal pregnancy, antepartum on their problem list.  Patient reports  worsening asthma symptoms and anxiety . Reports using her albuterol inhaler & nebulizer more frequently with the variations in temperature and allergies. Reached out to PCP for guidance. She also reports tearfulness, worsening anxiety and feeling not like herself.  Contractions: Not present. Vag. Bleeding: None.  Movement: Present. Denies leaking of fluid.   The following portions of the patient's history were reviewed and updated as appropriate: allergies, current medications, past family history, past medical history, past social history, past surgical history and problem list.   Objective:   Vitals:   06/18/22 1342  BP: 120/77  Pulse: 71  Weight: 233 lb (105.7 kg)   Fetal Status: Fetal Heart Rate (bpm): 156   Movement: Present     General:  Alert, oriented and cooperative. Patient is in no acute distress.  Skin: Skin is warm and dry. No rash noted.   Cardiovascular: Normal heart rate noted  Respiratory: Normal respiratory effort, no problems with respiration noted  Abdomen: Soft, gravid, appropriate for gestational age.  Pain/Pressure: Present      Assessment and Plan:  Pregnancy: G2P1001 at [redacted]w[redacted]d 1. Supervision of other normal pregnancy, antepartum 2. [redacted] weeks gestation of pregnancy Reviewed normal labs  3. Asthma Continue albuterol prn. Refilled symbicort for patient since she ran out and has not been taking.  4. S/P cesarean section Mode of delivery to be  addressed later in pregnancy  5. BMI 40.0-44.9, adult ([place])  6. Anxiety Offered BH with or without SSRI. Reviewed overall safety of SSRI with risk of self limited neonatal withdrawal-type syndrome after delivery. Discussed that these risks need to be balanced against significant risks of untreated maternal mental health conditions. After discussion she would like to meet with [facility] and is considering SSRI - Ambulatory referral to [place]  Please refer to After Visit Summary for other counseling recommendations.   Return in about 4 weeks (around 07/16/2022) for return OB at 24 weeks.  Future Appointments  Date Time Provider [place]  07/06/2022 12:45 PM [facility] NURSE [facility]  07/06/2022  1:00 PM [facility] US1 [facility]  07/20/2022  1:30 PM [place], Kathie Dike, CNM [facility] None   Inez Catalina, MD

## 2022-07-06 ENCOUNTER — Other Ambulatory Visit: Payer: Self-pay | Admitting: *Deleted

## 2022-07-06 ENCOUNTER — Ambulatory Visit: Payer: Medicaid Other | Admitting: *Deleted

## 2022-07-06 ENCOUNTER — Ambulatory Visit: Payer: Medicaid Other | Attending: Obstetrics

## 2022-07-06 VITALS — BP 110/72 | HR 84

## 2022-07-06 DIAGNOSIS — Z3480 Encounter for supervision of other normal pregnancy, unspecified trimester: Secondary | ICD-10-CM | POA: Insufficient documentation

## 2022-07-06 DIAGNOSIS — Z363 Encounter for antenatal screening for malformations: Secondary | ICD-10-CM | POA: Diagnosis not present

## 2022-07-06 DIAGNOSIS — Z3A22 22 weeks gestation of pregnancy: Secondary | ICD-10-CM | POA: Diagnosis not present

## 2022-07-06 DIAGNOSIS — O99212 Obesity complicating pregnancy, second trimester: Secondary | ICD-10-CM

## 2022-07-06 DIAGNOSIS — O34219 Maternal care for unspecified type scar from previous cesarean delivery: Secondary | ICD-10-CM

## 2022-07-06 DIAGNOSIS — J45909 Unspecified asthma, uncomplicated: Secondary | ICD-10-CM | POA: Diagnosis not present

## 2022-07-06 DIAGNOSIS — O99512 Diseases of the respiratory system complicating pregnancy, second trimester: Secondary | ICD-10-CM | POA: Insufficient documentation

## 2022-07-06 DIAGNOSIS — O321XX0 Maternal care for breech presentation, not applicable or unspecified: Secondary | ICD-10-CM | POA: Insufficient documentation

## 2022-07-06 DIAGNOSIS — O99213 Obesity complicating pregnancy, third trimester: Secondary | ICD-10-CM

## 2022-07-06 DIAGNOSIS — Z362 Encounter for other antenatal screening follow-up: Secondary | ICD-10-CM

## 2022-07-13 ENCOUNTER — Ambulatory Visit (INDEPENDENT_AMBULATORY_CARE_PROVIDER_SITE_OTHER): Payer: Medicaid Other | Admitting: Obstetrics and Gynecology

## 2022-07-13 ENCOUNTER — Ambulatory Visit (INDEPENDENT_AMBULATORY_CARE_PROVIDER_SITE_OTHER): Payer: Medicaid Other | Admitting: Licensed Clinical Social Worker

## 2022-07-13 ENCOUNTER — Encounter: Payer: Self-pay | Admitting: Obstetrics and Gynecology

## 2022-07-13 VITALS — BP 115/65 | HR 70 | Wt 240.0 lb

## 2022-07-13 DIAGNOSIS — Z8709 Personal history of other diseases of the respiratory system: Secondary | ICD-10-CM

## 2022-07-13 DIAGNOSIS — Z98891 History of uterine scar from previous surgery: Secondary | ICD-10-CM

## 2022-07-13 DIAGNOSIS — Z3A23 23 weeks gestation of pregnancy: Secondary | ICD-10-CM

## 2022-07-13 DIAGNOSIS — O99212 Obesity complicating pregnancy, second trimester: Secondary | ICD-10-CM

## 2022-07-13 DIAGNOSIS — O99210 Obesity complicating pregnancy, unspecified trimester: Secondary | ICD-10-CM

## 2022-07-13 DIAGNOSIS — Z3480 Encounter for supervision of other normal pregnancy, unspecified trimester: Secondary | ICD-10-CM

## 2022-07-13 DIAGNOSIS — F4322 Adjustment disorder with anxiety: Secondary | ICD-10-CM | POA: Diagnosis not present

## 2022-07-13 NOTE — BH Specialist Note (Signed)
Integrated Behavioral Health Initial In-Person Visit  MRN: [id number] Name: SERAPHINA ZAKOWSKI  Number of Integrated Behavioral Health Clinician visits: 1 Session Start time:   10:00am Session End time: 10:35am Total time in minutes: 35 mins in person   Types of Service: Individual psychotherapy  Interpretor:No. Interpretor Name and Language: none   Warm Hand Off Completed.        Subjective: Gloria Cox is a 27 y.o. female accompanied by n/a Patient was referred by Dr Alysia Penna for depressed mood and crying. Patient reports the following symptoms/concerns: depressed mood, fatigue, loss of interest, crying and limited support  Duration of problem: apprx 2 minths; Severity of problem: mild  Objective: Mood: good and Affect: Appropriate Risk of harm to self or others: No plan to harm self or others  Life Context: Family and Social: lives with partner  School/Work: unemp Self-Care: n/a Life Changes: new pregnancy  Patient and/or Family's Strengths/Protective Factors: Concrete supports in place (healthy food, safe environments, etc.)  Goals Addressed: Patient will: Reduce symptoms of: {IBH Symptoms:21014056} Increase knowledge and/or ability of: {IBH Patient Tools:21014057}  Demonstrate ability to: {IBH Goals:21014053}  Progress towards Goals: {CHL AMB BH PROGRESS TOWARDS GOALS:5092317438}  Interventions: Interventions utilized: {IBH Interventions:21014054}  Standardized Assessments completed: {IBH Screening Tools:21014051}  Patient and/or Family Response: ***  Patient Centered Plan: Patient is on the following Treatment Plan(s):  ***  Assessment: Patient currently experiencing ***.   Patient may benefit from ***.  Plan: Follow up with behavioral health clinician on : *** Behavioral recommendations: *** Referral(s): {IBH Referrals:21014055} "From scale of 1-10, how likely are you to follow plan?": ***  Gwyndolyn Saxon, LCSW

## 2022-07-13 NOTE — Progress Notes (Signed)
Subjective:  Gloria Cox is a 27 y.o. G2P1001 at [redacted]w[redacted]d being seen today for ongoing prenatal care.  She is currently monitored for the following issues for this high-risk pregnancy and has Hyperlipemia; Macromastia; Migraine without aura and without status migrainosus, not intractable; HSV-1 (herpes simplex virus 1) infection; Obesity in pregnancy; History of asthma; S/P cesarean section; Tobacco use; and Supervision of other normal pregnancy, antepartum on their problem list.  Patient reports no complaints.  Contractions: Not present. Vag. Bleeding: None.  Movement: Present. Denies leaking of fluid.   The following portions of the patient's history were reviewed and updated as appropriate: allergies, current medications, past family history, past medical history, past social history, past surgical history and problem list. Problem list updated.  Objective:   Vitals:   07/13/22 1017  BP: 115/65  Pulse: 70  Weight: 240 lb (108.9 kg)    Fetal Status: Fetal Heart Rate (bpm): 144   Movement: Present     General:  Alert, oriented and cooperative. Patient is in no acute distress.  Skin: Skin is warm and dry. No rash noted.   Cardiovascular: Normal heart rate noted  Respiratory: Normal respiratory effort, no problems with respiration noted  Abdomen: Soft, gravid, appropriate for gestational age. Pain/Pressure: Present     Pelvic:  Cervical exam deferred        Extremities: Normal range of motion.  Edema: None  Mental Status: Normal mood and affect. Normal behavior. Normal judgment and thought content.   Urinalysis:      Assessment and Plan:  Pregnancy: G2P1001 at [redacted]w[redacted]d  1. Supervision of other normal pregnancy, antepartum Stable Glucola next visit  2. S/P cesarean section Desires TOLAC Consented today  3. Obesity in pregnancy Serial growth scans as per MFM  4. History of asthma Stable  Preterm labor symptoms and general obstetric precautions including but not limited to  vaginal bleeding, contractions, leaking of fluid and fetal movement were reviewed in detail with the patient. Please refer to After Visit Summary for other counseling recommendations.  Return in about 4 weeks (around 08/10/2022) for OB visit, face to face, any provider, fasting for Glucola.   Hermina Staggers, MD

## 2022-07-20 ENCOUNTER — Encounter: Payer: Medicaid Other | Admitting: Advanced Practice Midwife

## 2022-07-20 ENCOUNTER — Institutional Professional Consult (permissible substitution): Payer: Medicaid Other | Admitting: Licensed Clinical Social Worker

## 2022-08-04 ENCOUNTER — Encounter: Payer: Self-pay | Admitting: *Deleted

## 2022-08-05 ENCOUNTER — Ambulatory Visit: Payer: Medicaid Other | Admitting: *Deleted

## 2022-08-05 ENCOUNTER — Ambulatory Visit: Payer: Medicaid Other | Attending: Maternal & Fetal Medicine

## 2022-08-05 VITALS — BP 123/69 | HR 70

## 2022-08-05 DIAGNOSIS — O98512 Other viral diseases complicating pregnancy, second trimester: Secondary | ICD-10-CM

## 2022-08-05 DIAGNOSIS — Z362 Encounter for other antenatal screening follow-up: Secondary | ICD-10-CM | POA: Insufficient documentation

## 2022-08-05 DIAGNOSIS — J45909 Unspecified asthma, uncomplicated: Secondary | ICD-10-CM

## 2022-08-05 DIAGNOSIS — O99213 Obesity complicating pregnancy, third trimester: Secondary | ICD-10-CM | POA: Diagnosis not present

## 2022-08-05 DIAGNOSIS — O99512 Diseases of the respiratory system complicating pregnancy, second trimester: Secondary | ICD-10-CM

## 2022-08-05 DIAGNOSIS — Z3A27 27 weeks gestation of pregnancy: Secondary | ICD-10-CM

## 2022-08-05 DIAGNOSIS — B009 Herpesviral infection, unspecified: Secondary | ICD-10-CM

## 2022-08-05 DIAGNOSIS — E669 Obesity, unspecified: Secondary | ICD-10-CM

## 2022-08-05 DIAGNOSIS — O34219 Maternal care for unspecified type scar from previous cesarean delivery: Secondary | ICD-10-CM

## 2022-08-05 DIAGNOSIS — O09292 Supervision of pregnancy with other poor reproductive or obstetric history, second trimester: Secondary | ICD-10-CM | POA: Diagnosis not present

## 2022-08-05 DIAGNOSIS — O99212 Obesity complicating pregnancy, second trimester: Secondary | ICD-10-CM | POA: Diagnosis present

## 2022-08-10 ENCOUNTER — Encounter: Payer: Self-pay | Admitting: Obstetrics and Gynecology

## 2022-08-10 ENCOUNTER — Other Ambulatory Visit: Payer: Medicaid Other

## 2022-08-12 ENCOUNTER — Encounter: Payer: Medicaid Other | Admitting: Obstetrics

## 2022-08-12 ENCOUNTER — Other Ambulatory Visit: Payer: Medicaid Other

## 2022-08-13 ENCOUNTER — Other Ambulatory Visit: Payer: Self-pay | Admitting: Emergency Medicine

## 2022-08-13 ENCOUNTER — Encounter: Payer: Self-pay | Admitting: Obstetrics

## 2022-08-13 DIAGNOSIS — Z3480 Encounter for supervision of other normal pregnancy, unspecified trimester: Secondary | ICD-10-CM

## 2022-08-13 MED ORDER — VITAFOL ULTRA 29-0.6-0.4-200 MG PO CAPS: 1.0000 | ORAL_CAPSULE | Freq: Every day | ORAL | 4 refills | Status: DC

## 2022-08-13 NOTE — Progress Notes (Signed)
Rx for PNV

## 2022-08-18 ENCOUNTER — Other Ambulatory Visit (HOSPITAL_COMMUNITY)
Admission: RE | Admit: 2022-08-18 | Discharge: 2022-08-18 | Disposition: A | Discharge status: Home / Self Care | Payer: Medicaid Other | Source: Ambulatory Visit | Attending: Obstetrics and Gynecology | Admitting: Obstetrics and Gynecology

## 2022-08-18 ENCOUNTER — Encounter: Payer: Self-pay | Admitting: Obstetrics

## 2022-08-18 ENCOUNTER — Other Ambulatory Visit: Payer: Medicaid Other

## 2022-08-18 ENCOUNTER — Ambulatory Visit (INDEPENDENT_AMBULATORY_CARE_PROVIDER_SITE_OTHER): Payer: Medicaid Other | Admitting: Obstetrics

## 2022-08-18 VITALS — BP 114/77 | HR 77 | Wt 245.0 lb

## 2022-08-18 DIAGNOSIS — B3731 Acute candidiasis of vulva and vagina: Secondary | ICD-10-CM

## 2022-08-18 DIAGNOSIS — N898 Other specified noninflammatory disorders of vagina: Secondary | ICD-10-CM

## 2022-08-18 DIAGNOSIS — Z23 Encounter for immunization: Secondary | ICD-10-CM | POA: Diagnosis not present

## 2022-08-18 DIAGNOSIS — O99210 Obesity complicating pregnancy, unspecified trimester: Secondary | ICD-10-CM

## 2022-08-18 DIAGNOSIS — B369 Superficial mycosis, unspecified: Secondary | ICD-10-CM

## 2022-08-18 DIAGNOSIS — Z3480 Encounter for supervision of other normal pregnancy, unspecified trimester: Secondary | ICD-10-CM

## 2022-08-18 DIAGNOSIS — O99810 Abnormal glucose complicating pregnancy: Secondary | ICD-10-CM

## 2022-08-18 DIAGNOSIS — Z3A28 28 weeks gestation of pregnancy: Secondary | ICD-10-CM

## 2022-08-18 MED ORDER — CICLOPIROX OLAMINE 0.77 % EX CREA: TOPICAL_CREAM | Freq: Two times a day (BID) | CUTANEOUS | 2 refills | Status: DC

## 2022-08-18 MED ORDER — TERCONAZOLE 0.8 % VA CREA: 1.0000 | TOPICAL_CREAM | Freq: Every day | VAGINAL | 0 refills | Status: DC

## 2022-08-18 NOTE — Progress Notes (Signed)
   Subjective:  Gloria Cox is a 27 y.o. G2P1001 at [redacted]w[redacted]d being seen today for ongoing prenatal care.  She is currently monitored for the following issues for this low-risk pregnancy and has HSV-1 (herpes simplex virus 1) infection; Obesity in pregnancy; S/P cesarean section; Tobacco use; and Supervision of other normal pregnancy, antepartum on their problem list.  Patient reports vaginal irritation and itching in breast folds, abdominal folds and in groin areas .  Contractions: Not present. Vag. Bleeding: None.  Movement: Present. Denies leaking of fluid.   The following portions of the patient's history were reviewed and updated as appropriate: allergies, current medications, past family history, past medical history, past social history, past surgical history and problem list. Problem list updated.  Objective:   Vitals:   08/18/22 1042  BP: 114/77  Pulse: 77  Weight: 245 lb (111.1 kg)    Fetal Status:     Movement: Present     General:  Alert, oriented and cooperative. Patient is in no acute distress.  Skin: Skin is warm and dry. No rash noted.   Cardiovascular: Normal heart rate noted  Respiratory: Normal respiratory effort, no problems with respiration noted  Abdomen: Soft, gravid, appropriate for gestational age. Pain/Pressure: Absent     Pelvic:  Cervical exam deferred        Extremities: Normal range of motion.  Edema: None  Mental Status: Normal mood and affect. Normal behavior. Normal judgment and thought content.   Urinalysis:      Assessment and Plan:  Pregnancy: G2P1001 at [redacted]w[redacted]d  1. Supervision of other normal pregnancy, antepartum  2. [redacted] weeks gestation of pregnancy Rx: - Glucose Tolerance, 2 Hours w/1 Hour - RPR - CBC - HIV Antibody (routine testing w rflx) - Tdap vaccine greater than or equal to 7yo IM - POC Urinalysis Dipstick OB  3. Vaginal discharge Rx: - Cervicovaginal ancillary only( [place])  4. Candidal vulvovaginitis Rx: - terconazole  (TERAZOL 3) 0.8 % vaginal cream; Place 1 applicator vaginally at bedtime.  Dispense: 20 g; Refill: 0  5. Fungal skin infection Rx: - ciclopirox (LOPROX) 0.77 % cream; Apply topically 2 (two) times daily.  Dispense: 90 g; Refill: 2  6. Obesity affecting pregnancy, antepartum, unspecified obesity type  7. Prediabetes in mother during pregnancy    Preterm labor symptoms and general obstetric precautions including but not limited to vaginal bleeding, contractions, leaking of fluid and fetal movement were reviewed in detail with the patient. Please refer to After Visit Summary for other counseling recommendations.    Return in about 2 weeks (around 09/01/2022) for ROB.   Brock Bad, MD 08/18/22

## 2022-08-18 NOTE — Progress Notes (Addendum)
Pt presents for ROB. 28 week labs, gtt collected today. C/o of vaginal odor, states it smells like yeast. Also has rash on groin and under belly, concerned for yeast. Also reports dark urine with odor.   [id number] GAD-18 Wants to schedule with Sue Lush

## 2022-08-19 ENCOUNTER — Telehealth: Payer: Self-pay | Admitting: Licensed Clinical Social Worker

## 2022-08-19 LAB — HIV ANTIBODY (ROUTINE TESTING W REFLEX): HIV Screen 4th Generation wRfx: NONREACTIVE

## 2022-08-19 LAB — CBC
Hematocrit: 35.5 % (ref 34.0–46.6)
Hemoglobin: 11.8 g/dL (ref 11.1–15.9)
MCH: 29.7 pg (ref 26.6–33.0)
MCHC: 33.2 g/dL (ref 31.5–35.7)
MCV: 89 fL (ref 79–97)
Platelets: 187 10*3/uL (ref 150–450)
RBC: 3.97 x10E6/uL (ref 3.77–5.28)
RDW: 13.3 % (ref 11.7–15.4)
WBC: 9 10*3/uL (ref 3.4–10.8)

## 2022-08-19 LAB — CERVICOVAGINAL ANCILLARY ONLY
Bacterial Vaginitis (gardnerella): NEGATIVE
Candida Glabrata: NEGATIVE
Candida Vaginitis: POSITIVE — AB
Chlamydia: NEGATIVE
Comment: NEGATIVE
Comment: NEGATIVE
Comment: NEGATIVE
Comment: NEGATIVE
Comment: NEGATIVE
Comment: NORMAL
Neisseria Gonorrhea: NEGATIVE
Trichomonas: NEGATIVE

## 2022-08-19 LAB — GLUCOSE TOLERANCE, 2 HOURS W/ 1HR
Glucose, 1 hour: 160 mg/dL (ref 70–179)
Glucose, 2 hour: 109 mg/dL (ref 70–152)
Glucose, Fasting: 104 mg/dL — ABNORMAL HIGH (ref 70–91)

## 2022-08-19 LAB — RPR: RPR Ser Ql: NONREACTIVE

## 2022-08-19 NOTE — Telephone Encounter (Signed)
Called to advised pt of June 6 visit. Two visits please arrive for ob visit scheduled at 955am ibh visit is after at 11am

## 2022-08-25 ENCOUNTER — Other Ambulatory Visit: Payer: Self-pay | Admitting: Obstetrics

## 2022-08-25 DIAGNOSIS — O24410 Gestational diabetes mellitus in pregnancy, diet controlled: Secondary | ICD-10-CM

## 2022-08-31 ENCOUNTER — Ambulatory Visit: Payer: Medicaid Other

## 2022-09-02 ENCOUNTER — Ambulatory Visit: Payer: Medicaid Other | Admitting: Registered"

## 2022-09-02 NOTE — Progress Notes (Unsigned)
The following learning objectives were met by the patient during this course:   States the definition of Gestational Diabetes States why dietary management is important in controlling blood glucose Describes the effects each nutrient has on blood glucose levels Demonstrates ability to create a balanced meal plan Demonstrates carbohydrate counting  States when to check blood glucose levels Demonstrates proper blood glucose monitoring techniques States the effect of stress and exercise on blood glucose levels States the importance of limiting caffeine and abstaining from alcohol and smoking   Blood glucose monitor given: None. Patient has meter and checking blood sugar prior to class.   Patient instructed to monitor glucose levels: FBS: 60 - ? 95 mg/dL; 1 hr: <140 OR 2 hr: <120   Patient received handouts: Nutrition Diabetes and Pregnancy, including carb counting list and glucose log sheet   Patient will be seen for follow-up as needed. 

## 2022-09-03 ENCOUNTER — Ambulatory Visit (INDEPENDENT_AMBULATORY_CARE_PROVIDER_SITE_OTHER): Payer: Medicaid Other | Admitting: Licensed Clinical Social Worker

## 2022-09-03 ENCOUNTER — Encounter: Payer: Self-pay | Admitting: Obstetrics

## 2022-09-03 ENCOUNTER — Ambulatory Visit (INDEPENDENT_AMBULATORY_CARE_PROVIDER_SITE_OTHER): Payer: Medicaid Other | Admitting: Obstetrics

## 2022-09-03 VITALS — BP 111/69 | HR 88 | Wt 249.1 lb

## 2022-09-03 DIAGNOSIS — Z3A31 31 weeks gestation of pregnancy: Secondary | ICD-10-CM

## 2022-09-03 DIAGNOSIS — F4322 Adjustment disorder with anxiety: Secondary | ICD-10-CM | POA: Diagnosis not present

## 2022-09-03 DIAGNOSIS — J301 Allergic rhinitis due to pollen: Secondary | ICD-10-CM

## 2022-09-03 DIAGNOSIS — Z3480 Encounter for supervision of other normal pregnancy, unspecified trimester: Secondary | ICD-10-CM

## 2022-09-03 DIAGNOSIS — Z98891 History of uterine scar from previous surgery: Secondary | ICD-10-CM

## 2022-09-03 DIAGNOSIS — K219 Gastro-esophageal reflux disease without esophagitis: Secondary | ICD-10-CM

## 2022-09-03 DIAGNOSIS — O99210 Obesity complicating pregnancy, unspecified trimester: Secondary | ICD-10-CM

## 2022-09-03 MED ORDER — CETIRIZINE HCL 10 MG PO TABS: 10.0000 mg | ORAL_TABLET | Freq: Every day | ORAL | 11 refills | Status: DC

## 2022-09-03 MED ORDER — VITAFOL ULTRA 29-0.6-0.4-200 MG PO CAPS: 1.0000 | ORAL_CAPSULE | Freq: Every day | ORAL | 4 refills | Status: DC

## 2022-09-03 MED ORDER — PANTOPRAZOLE SODIUM 40 MG PO TBEC: 40.0000 mg | DELAYED_RELEASE_TABLET | Freq: Two times a day (BID) | ORAL | 5 refills | Status: DC

## 2022-09-03 NOTE — Progress Notes (Signed)
Subjective:  Gloria Cox is a 27 y.o. G2P1001 at [redacted]w[redacted]d being seen today for ongoing prenatal care.  She is currently monitored for the following issues for this high-risk pregnancy and has HSV-1 (herpes simplex virus 1) infection; Obesity in pregnancy; S/P cesarean section; Tobacco use; and Supervision of other normal pregnancy, antepartum on their problem list.  Patient reports heartburn and seasonal allergies .  Contractions: Not present. Vag. Bleeding: None.  Movement: Present. Denies leaking of fluid.   The following portions of the patient's history were reviewed and updated as appropriate: allergies, current medications, past family history, past medical history, past social history, past surgical history and problem list. Problem list updated.  Objective:   Vitals:   09/03/22 1003  BP: 111/69  Pulse: 88  Weight: 249 lb 1.6 oz (113 kg)    Fetal Status: Fetal Heart Rate (bpm): 145   Movement: Present     General:  Alert, oriented and cooperative. Patient is in no acute distress.  Skin: Skin is warm and dry. No rash noted.   Cardiovascular: Normal heart rate noted  Respiratory: Normal respiratory effort, no problems with respiration noted  Abdomen: Soft, gravid, appropriate for gestational age. Pain/Pressure: Present     Pelvic:  Cervical exam deferred        Extremities: Normal range of motion.  Edema: Trace  Mental Status: Normal mood and affect. Normal behavior. Normal judgment and thought content.   Urinalysis:      Assessment and Plan:  Pregnancy: G2P1001 at [redacted]w[redacted]d  1. Supervision of other normal pregnancy, antepartum Rx: - Prenat-Fe Poly-Methfol-FA-DHA (VITAFOL ULTRA) 29-0.6-0.4-200 MG CAPS; Take 1 capsule by mouth daily before breakfast.  Dispense: 90 capsule; Refill: 4  2. S/P cesarean section ( [facility] ) - desires VBAC.  Consent signed  3. Obesity in pregnancy  4. GERD without esophagitis Rx: - pantoprazole (PROTONIX) 40 MG tablet; Take 1 tablet (40 mg total)  by mouth 2 (two) times daily before a meal.  Dispense: 60 tablet; Refill: 5  5. Seasonal allergic rhinitis due to pollen Rx: - cetirizine (ZYRTEC ALLERGY) 10 MG tablet; Take 1 tablet (10 mg total) by mouth daily.  Dispense: 30 tablet; Refill: 11   Preterm labor symptoms and general obstetric precautions including but not limited to vaginal bleeding, contractions, leaking of fluid and fetal movement were reviewed in detail with the patient. Please refer to After Visit Summary for other counseling recommendations.   Return in about 1 day (around 09/04/2022) for 2 hour OGTT ( Lab Only ).   Brock Bad, MD 09/03/22

## 2022-09-03 NOTE — BH Specialist Note (Signed)
Integrated Behavioral Health Follow Up In-Person Visit  MRN: [id number] Name: Gloria Cox  Number of Integrated Behavioral Health Clinician visits: 2 Session Start time:  10:40am Session End time: 11:17am Total time in minutes: 37 mins in [facility] at [facility]   Types of Service: Individual psychotherapy  Interpretor:No. Interpretor Name and Language: none  Subjective: Gloria Cox is a 27 y.o. female accompanied by n/a Patient was referred by Dr. Clearance Coots for anxiety and depressed mood. Patient reports the following symptoms/concerns: crying, relationship conflict, depressed mood, limited social support.  Duration of problem: approx one year; Severity of problem: mild  Objective: Mood: good  and Affect: Appropriate Risk of harm to self or others: No plan to harm self or others  Life Context: Family and Social: Lives with boyfriend and mother School/Work: n/a Self-Care: n/a Life Changes: n/a  Patient and/or Family's Strengths/Protective Factors: Concrete supports in place (healthy food, safe environments, etc.)  Goals Addressed: Patient will:  Reduce symptoms of: anxiety and stress   Increase knowledge and/or ability of: coping skills and stress    Demonstrate ability to: Increase adequate support systems for patient/family  Progress towards Goals: Ongoing  Interventions: Interventions utilized:  Supportive Counselinglives wht  Standardized Assessments completed: PHQ 9  Patient and/or Family Response: Ms. Gabelman reports increased depressed due to relationship conflict with father of baby. Ms. Zanardi reports history of childhood trauma with both parents negatively impacted by substance use. Ms. Kallal and LCSW A Felton Clinton discuss creating smart goals to improve personal development and parenting plan   Assessment: Patient currently experiencing adjustment disorder with anxiety.   Patient may benefit from integrated behavioral health.  Plan: Follow up with  behavioral health clinician on : 09/18/2022 Behavioral recommendations: Create smart goals, engage in self care, communicate needs with partner  Referral(s): Integrated [employer] (In Clinic) "From scale of 1-10, how likely are you to follow plan?":    Gwyndolyn Saxon, LCSW

## 2022-09-03 NOTE — Progress Notes (Signed)
Pt reports fetal movement and occasional pressure. Pt states that the day of her glucose testing she drank a soda at 2am which is why she did not pass her fasting. Pt is requesting repeat testing.

## 2022-09-04 ENCOUNTER — Other Ambulatory Visit: Payer: Medicaid Other

## 2022-09-04 DIAGNOSIS — Z3480 Encounter for supervision of other normal pregnancy, unspecified trimester: Secondary | ICD-10-CM

## 2022-09-04 DIAGNOSIS — O24419 Gestational diabetes mellitus in pregnancy, unspecified control: Secondary | ICD-10-CM

## 2022-09-05 LAB — GLUCOSE TOLERANCE, 2 HOURS W/ 1HR
Glucose, 1 hour: 183 mg/dL — ABNORMAL HIGH (ref 70–179)
Glucose, 2 hour: 119 mg/dL (ref 70–152)
Glucose, Fasting: 91 mg/dL (ref 70–91)

## 2022-09-08 DIAGNOSIS — O24419 Gestational diabetes mellitus in pregnancy, unspecified control: Secondary | ICD-10-CM | POA: Insufficient documentation

## 2022-09-09 ENCOUNTER — Other Ambulatory Visit: Payer: Self-pay

## 2022-09-09 DIAGNOSIS — O24410 Gestational diabetes mellitus in pregnancy, diet controlled: Secondary | ICD-10-CM

## 2022-09-09 MED ORDER — ACCU-CHEK SOFTCLIX LANCETS MISC: 100.0000 | Freq: Four times a day (QID) | 12 refills | Status: DC

## 2022-09-09 MED ORDER — ACCU-CHEK GUIDE VI STRP: ORAL_STRIP | 12 refills | Status: DC

## 2022-09-09 MED ORDER — ACCU-CHEK GUIDE W/DEVICE KIT: 1.0000 | PACK | Freq: Every day | 0 refills | Status: DC

## 2022-09-18 ENCOUNTER — Encounter: Payer: Self-pay | Admitting: Obstetrics

## 2022-09-18 ENCOUNTER — Ambulatory Visit (INDEPENDENT_AMBULATORY_CARE_PROVIDER_SITE_OTHER): Payer: Medicaid Other | Admitting: Obstetrics

## 2022-09-18 VITALS — BP 115/74 | HR 71

## 2022-09-18 DIAGNOSIS — K219 Gastro-esophageal reflux disease without esophagitis: Secondary | ICD-10-CM

## 2022-09-18 DIAGNOSIS — Z98891 History of uterine scar from previous surgery: Secondary | ICD-10-CM

## 2022-09-18 DIAGNOSIS — O0993 Supervision of high risk pregnancy, unspecified, third trimester: Secondary | ICD-10-CM

## 2022-09-18 DIAGNOSIS — O24410 Gestational diabetes mellitus in pregnancy, diet controlled: Secondary | ICD-10-CM

## 2022-09-18 DIAGNOSIS — O99210 Obesity complicating pregnancy, unspecified trimester: Secondary | ICD-10-CM

## 2022-09-18 DIAGNOSIS — O0990 Supervision of high risk pregnancy, unspecified, unspecified trimester: Secondary | ICD-10-CM

## 2022-09-18 DIAGNOSIS — Z3A33 33 weeks gestation of pregnancy: Secondary | ICD-10-CM

## 2022-09-18 DIAGNOSIS — F4322 Adjustment disorder with anxiety: Secondary | ICD-10-CM

## 2022-09-18 MED ORDER — METFORMIN HCL ER 500 MG PO TB24: 1000.0000 mg | ORAL_TABLET | Freq: Two times a day (BID) | ORAL | 5 refills | Status: DC

## 2022-09-18 NOTE — Progress Notes (Signed)
Subjective:  Gloria Cox is a 27 y.o. G2P1001 at [redacted]w[redacted]d being seen today for ongoing prenatal care.  She is currently monitored for the following issues for this high-risk pregnancy and has HSV-1 (herpes simplex virus 1) infection; Obesity in pregnancy; S/P cesarean section; Tobacco use; Supervision of other normal pregnancy, antepartum; and Gestational diabetes mellitus (GDM) in third trimester on their problem list.  Patient reports no complaints.  Contractions: Irritability. Vag. Bleeding: None.  Movement: Present. Denies leaking of fluid.   The following portions of the patient's history were reviewed and updated as appropriate: allergies, current medications, past family history, past medical history, past social history, past surgical history and problem list. Problem list updated.  Objective:   Vitals:   09/18/22 1103  BP: 115/74  Pulse: 71    Fetal Status: Fetal Heart Rate (bpm): 130   Movement: Present     General:  Alert, oriented and cooperative. Patient is in no acute distress.  Skin: Skin is warm and dry. No rash noted.   Cardiovascular: Normal heart rate noted  Respiratory: Normal respiratory effort, no problems with respiration noted  Abdomen: Soft, gravid, appropriate for gestational age. Pain/Pressure: Present     Pelvic:  Cervical exam deferred        Extremities: Normal range of motion.  Edema: Trace  Mental Status: Normal mood and affect. Normal behavior. Normal judgment and thought content.   Urinalysis:      Assessment and Plan:  Pregnancy: G2P1001 at [redacted]w[redacted]d  1. Supervision of high risk pregnancy, antepartum  2. Diet controlled White classification A1 gestational diabetes mellitus (GDM) - glucose control not optimal:  FBS's:  90"s-110  and   2 hr PP's:  90's-156 - will start Metformin Rx: - metFORMIN (GLUCOPHAGE-XR) 500 MG 24 hr tablet; Take 2 tablets (1,000 mg total) by mouth 2 (two) times daily with a meal.  Dispense: 120 tablet; Refill: 5  3. S/P  cesarean section  4. Obesity in pregnancy  5. GERD without esophagitis - Protonix prn  6. Adjustment disorder with anxiety - clinically stable   Preterm labor symptoms and general obstetric precautions including but not limited to vaginal bleeding, contractions, leaking of fluid and fetal movement were reviewed in detail with the patient. Please refer to After Visit Summary for other counseling recommendations.   Return in about 2 weeks (around 10/02/2022) for [facility].   Brock Bad, MD 09/18/22    Pt reports fetal movement with some pressure and irritability. Reports fasting BG today was 96

## 2022-09-28 ENCOUNTER — Ambulatory Visit: Payer: Medicaid Other | Admitting: *Deleted

## 2022-09-28 ENCOUNTER — Other Ambulatory Visit: Payer: Self-pay | Admitting: Maternal & Fetal Medicine

## 2022-09-28 ENCOUNTER — Other Ambulatory Visit: Payer: Self-pay | Admitting: *Deleted

## 2022-09-28 ENCOUNTER — Ambulatory Visit: Payer: Medicaid Other | Attending: Maternal & Fetal Medicine

## 2022-09-28 ENCOUNTER — Encounter: Payer: Self-pay | Admitting: *Deleted

## 2022-09-28 VITALS — BP 119/72 | HR 68

## 2022-09-28 DIAGNOSIS — O98813 Other maternal infectious and parasitic diseases complicating pregnancy, third trimester: Secondary | ICD-10-CM

## 2022-09-28 DIAGNOSIS — O34219 Maternal care for unspecified type scar from previous cesarean delivery: Secondary | ICD-10-CM

## 2022-09-28 DIAGNOSIS — J45909 Unspecified asthma, uncomplicated: Secondary | ICD-10-CM

## 2022-09-28 DIAGNOSIS — O24419 Gestational diabetes mellitus in pregnancy, unspecified control: Secondary | ICD-10-CM | POA: Insufficient documentation

## 2022-09-28 DIAGNOSIS — O99213 Obesity complicating pregnancy, third trimester: Secondary | ICD-10-CM

## 2022-09-28 DIAGNOSIS — B009 Herpesviral infection, unspecified: Secondary | ICD-10-CM | POA: Diagnosis not present

## 2022-09-28 DIAGNOSIS — Z3A39 39 weeks gestation of pregnancy: Secondary | ICD-10-CM | POA: Diagnosis not present

## 2022-09-28 DIAGNOSIS — O99513 Diseases of the respiratory system complicating pregnancy, third trimester: Secondary | ICD-10-CM | POA: Diagnosis not present

## 2022-09-28 DIAGNOSIS — O283 Abnormal ultrasonic finding on antenatal screening of mother: Secondary | ICD-10-CM | POA: Diagnosis not present

## 2022-09-28 DIAGNOSIS — Z3480 Encounter for supervision of other normal pregnancy, unspecified trimester: Secondary | ICD-10-CM

## 2022-09-28 DIAGNOSIS — Z362 Encounter for other antenatal screening follow-up: Secondary | ICD-10-CM

## 2022-09-28 DIAGNOSIS — Z3A34 34 weeks gestation of pregnancy: Secondary | ICD-10-CM

## 2022-09-28 DIAGNOSIS — O24415 Gestational diabetes mellitus in pregnancy, controlled by oral hypoglycemic drugs: Secondary | ICD-10-CM

## 2022-09-28 DIAGNOSIS — E669 Obesity, unspecified: Secondary | ICD-10-CM

## 2022-09-28 NOTE — Procedures (Addendum)
Gloria Cox [birth year] [redacted]w[redacted]d  Fetus A Non-Stress Test Interpretation for 09/28/22  BPP with NST  Indication: Unsatisfactory BPP  Fetal Heart Rate A Baseline Rate (A): 130 bpm Variability: Moderate Accelerations: 15 x 15 Decelerations: None Multiple birth?: No  Uterine Activity Mode: Palpation, Toco Contraction Frequency (min): none Resting Tone Palpated: Relaxed  Interpretation (Fetal Testing) Nonstress Test Interpretation: Reactive Overall Impression: Reassuring for gestational age Comments: Dr. Darra Lis reviewed tracing

## 2022-10-02 ENCOUNTER — Encounter: Payer: Self-pay | Admitting: Obstetrics

## 2022-10-05 ENCOUNTER — Ambulatory Visit: Payer: Medicaid Other | Attending: Maternal & Fetal Medicine | Admitting: *Deleted

## 2022-10-05 ENCOUNTER — Other Ambulatory Visit (HOSPITAL_COMMUNITY)
Admission: RE | Admit: 2022-10-05 | Discharge: 2022-10-05 | Disposition: A | Discharge status: Home / Self Care | Payer: Medicaid Other | Source: Ambulatory Visit | Attending: Obstetrics and Gynecology | Admitting: Obstetrics and Gynecology

## 2022-10-05 ENCOUNTER — Ambulatory Visit (INDEPENDENT_AMBULATORY_CARE_PROVIDER_SITE_OTHER): Payer: Medicaid Other | Admitting: Obstetrics and Gynecology

## 2022-10-05 VITALS — BP 119/84 | HR 92 | Wt 251.0 lb

## 2022-10-05 DIAGNOSIS — O99213 Obesity complicating pregnancy, third trimester: Secondary | ICD-10-CM

## 2022-10-05 DIAGNOSIS — Z3A35 35 weeks gestation of pregnancy: Secondary | ICD-10-CM | POA: Diagnosis not present

## 2022-10-05 DIAGNOSIS — Z3480 Encounter for supervision of other normal pregnancy, unspecified trimester: Secondary | ICD-10-CM

## 2022-10-05 DIAGNOSIS — O24415 Gestational diabetes mellitus in pregnancy, controlled by oral hypoglycemic drugs: Secondary | ICD-10-CM

## 2022-10-05 DIAGNOSIS — B3731 Acute candidiasis of vulva and vagina: Secondary | ICD-10-CM

## 2022-10-05 DIAGNOSIS — O24410 Gestational diabetes mellitus in pregnancy, diet controlled: Secondary | ICD-10-CM | POA: Insufficient documentation

## 2022-10-05 DIAGNOSIS — Z3490 Encounter for supervision of normal pregnancy, unspecified, unspecified trimester: Secondary | ICD-10-CM

## 2022-10-05 DIAGNOSIS — O99210 Obesity complicating pregnancy, unspecified trimester: Secondary | ICD-10-CM

## 2022-10-05 DIAGNOSIS — Z98891 History of uterine scar from previous surgery: Secondary | ICD-10-CM

## 2022-10-05 DIAGNOSIS — B009 Herpesviral infection, unspecified: Secondary | ICD-10-CM

## 2022-10-05 MED ORDER — TERCONAZOLE 0.8 % VA CREA: 1.0000 | TOPICAL_CREAM | Freq: Every day | VAGINAL | 0 refills | Status: DC

## 2022-10-05 NOTE — Procedures (Signed)
Gloria Cox 12/16/1995 [redacted]w[redacted]d  Fetus A Non-Stress Test Interpretation for 10/05/22 NST only  Indication:  GDM  Fetal Heart Rate A Mode: External Baseline Rate (A): 120 bpm Variability: Minimal, Moderate Accelerations: 15 x 15 Decelerations: None Multiple birth?: No  Uterine Activity Mode: Palpation, Toco Contraction Frequency (min): ui Resting Tone Palpated: Relaxed  Interpretation (Fetal Testing) Nonstress Test Interpretation: Reactive Overall Impression: Reassuring for gestational age Comments: Dr. Parke Poisson reviewed tracing

## 2022-10-05 NOTE — Progress Notes (Signed)
   PRENATAL VISIT NOTE  Subjective:  Gloria Cox is a 27 y.o. G2P1001 at [redacted]w[redacted]d being seen today for ongoing prenatal care.  She is currently monitored for the following issues for this high-risk pregnancy and has HSV-1 (herpes simplex virus 1) infection; Obesity in pregnancy; S/P cesarean section; Tobacco use; Supervision of other normal pregnancy, antepartum; and Gestational diabetes mellitus (GDM) in third trimester on their problem list.  Patient doing well with no acute concerns today. She reports no complaints.  Contractions: Not present. Vag. Bleeding: None.  Movement: Present. Denies leaking of fluid.   The following portions of the patient's history were reviewed and updated as appropriate: allergies, current medications, past family history, past medical history, past social history, past surgical history and problem list. Problem list updated.  Objective:   Vitals:   10/05/22 1029  BP: 119/84  Pulse: 92  Weight: 251 lb (113.9 kg)    Fetal Status: Fetal Heart Rate (bpm): 130 Fundal Height: 36 cm Movement: Present     General:  Alert, oriented and cooperative. Patient is in no acute distress.  Skin: Skin is warm and dry. No rash noted.   Cardiovascular: Normal heart rate noted  Respiratory: Normal respiratory effort, no problems with respiration noted  Abdomen: Soft, gravid, appropriate for gestational age.  Pain/Pressure: Present     Pelvic: Cervical exam deferred        Extremities: Normal range of motion.     Mental Status:  Normal mood and affect. Normal behavior. Normal judgment and thought content.   Assessment and Plan:  Pregnancy: G2P1001 at [redacted]w[redacted]d  1. [redacted] weeks gestation of pregnancy   2. Gestational diabetes mellitus (GDM) in third trimester controlled on oral hypoglycemic drug FBS: 80-90 PPBS: 98-117 Continue weekly testing and oral metformin, IOL at or around 39 weeks  3. Supervision of other normal pregnancy, antepartum Continue routine prenatal  care  - Culture, beta strep (group b only) - Cervicovaginal ancillary only( [place])  4. S/P cesarean section Reviewed VBAC risks with patient  Consent previously signed All questions answered  5. Obesity in pregnancy   6. HSV-1 (herpes simplex virus 1) infection Per pt she has only ever had oral outbreak and none recent, will hold prophylaxis at this time  7. Candidal vulvovaginitis Visual yeast noted, vaginal swab pending, will treat empirically - terconazole (TERAZOL 3) 0.8 % vaginal cream; Place 1 applicator vaginally at bedtime.  Dispense: 20 g; Refill: 0  Preterm labor symptoms and general obstetric precautions including but not limited to vaginal bleeding, contractions, leaking of fluid and fetal movement were reviewed in detail with the patient.  Please refer to After Visit Summary for other counseling recommendations.   Return in about 1 week (around 10/12/2022) for [facility], in person.   Mariel Aloe, MD Faculty Attending Center for [facility]

## 2022-10-05 NOTE — Progress Notes (Signed)
Pt complains of possible BV or yeast infection. Pt had some spotting with intercourse.  Pt states glucose had been better controlled with Metformin.  Per MFM, last u/s, deliver around 39 weeks  --   please discuss with pt.

## 2022-10-06 LAB — CERVICOVAGINAL ANCILLARY ONLY
Bacterial Vaginitis (gardnerella): POSITIVE — AB
Candida Glabrata: NEGATIVE
Candida Vaginitis: POSITIVE — AB
Chlamydia: NEGATIVE
Comment: NEGATIVE
Comment: NEGATIVE
Comment: NEGATIVE
Comment: NEGATIVE
Comment: NEGATIVE
Comment: NORMAL
Neisseria Gonorrhea: NEGATIVE
Trichomonas: NEGATIVE

## 2022-10-07 ENCOUNTER — Encounter: Payer: Self-pay | Admitting: Obstetrics and Gynecology

## 2022-10-07 ENCOUNTER — Other Ambulatory Visit: Payer: Self-pay | Admitting: Emergency Medicine

## 2022-10-07 MED ORDER — METRONIDAZOLE 500 MG PO TABS: 500.0000 mg | ORAL_TABLET | Freq: Two times a day (BID) | ORAL | 0 refills | Status: DC

## 2022-10-07 MED ORDER — TERCONAZOLE 0.4 % VA CREA: 1.0000 | TOPICAL_CREAM | Freq: Every day | VAGINAL | 0 refills | Status: DC

## 2022-10-07 NOTE — Progress Notes (Signed)
Rx for yeast and BV 

## 2022-10-09 LAB — CULTURE, BETA STREP (GROUP B ONLY): Strep Gp B Culture: NEGATIVE

## 2022-10-12 ENCOUNTER — Ambulatory Visit: Payer: Medicaid Other | Admitting: Obstetrics & Gynecology

## 2022-10-12 ENCOUNTER — Ambulatory Visit: Payer: Medicaid Other | Attending: Maternal & Fetal Medicine | Admitting: *Deleted

## 2022-10-12 VITALS — BP 124/85 | HR 68 | Wt 251.4 lb

## 2022-10-12 DIAGNOSIS — O24415 Gestational diabetes mellitus in pregnancy, controlled by oral hypoglycemic drugs: Secondary | ICD-10-CM

## 2022-10-12 DIAGNOSIS — O99210 Obesity complicating pregnancy, unspecified trimester: Secondary | ICD-10-CM

## 2022-10-12 DIAGNOSIS — Z98891 History of uterine scar from previous surgery: Secondary | ICD-10-CM

## 2022-10-12 DIAGNOSIS — Z3A36 36 weeks gestation of pregnancy: Secondary | ICD-10-CM

## 2022-10-12 DIAGNOSIS — O34219 Maternal care for unspecified type scar from previous cesarean delivery: Secondary | ICD-10-CM

## 2022-10-12 DIAGNOSIS — Z3480 Encounter for supervision of other normal pregnancy, unspecified trimester: Secondary | ICD-10-CM

## 2022-10-12 DIAGNOSIS — O99213 Obesity complicating pregnancy, third trimester: Secondary | ICD-10-CM

## 2022-10-12 DIAGNOSIS — Z720 Tobacco use: Secondary | ICD-10-CM

## 2022-10-12 MED ORDER — VITAFOL ULTRA 29-0.6-0.4-200 MG PO CAPS
1.0000 | ORAL_CAPSULE | Freq: Every day | ORAL | 4 refills | Status: DC
Start: 2022-10-12 — End: 2023-02-13

## 2022-10-12 NOTE — Procedures (Signed)
Gloria Cox 1995/08/03 [redacted]w[redacted]d  Fetus A Non-Stress Test Interpretation for 10/12/22  NST only  Indication: Gestational Diabetes medication controlled  Fetal Heart Rate A Mode: External Baseline Rate (A): 130 bpm Variability: Minimal, Moderate Accelerations: 15 x 15 Decelerations: None Multiple birth?: No  Uterine Activity Mode: Palpation, Toco Contraction Frequency (min): occ with ui Contraction Duration (sec): 50-70 Contraction Quality: Mild Resting Tone Palpated: Relaxed Resting Time: Adequate  Interpretation (Fetal Testing) Overall Impression: Reassuring for gestational age Comments: Dr. Darra Lis reviewed tracing

## 2022-10-12 NOTE — Progress Notes (Signed)
   PRENATAL VISIT NOTE  Subjective:  Gloria Cox is a 27 y.o. G2P1001 at [redacted]w[redacted]d being seen today for ongoing prenatal care.  She is currently monitored for the following issues for this high-risk pregnancy and has HSV-1 (herpes simplex virus 1) infection; Obesity in pregnancy; S/P cesarean section; Tobacco use; Supervision of other normal pregnancy, antepartum; and Gestational diabetes mellitus (GDM) in third trimester on their problem list.  Patient reports no complaints.  Contractions: Not present. Vag. Bleeding: None.  Movement: Present. Denies leaking of fluid.   The following portions of the patient's history were reviewed and updated as appropriate: allergies, current medications, past family history, past medical history, past social history, past surgical history and problem list.   Objective:   Vitals:   10/12/22 1054  BP: 124/85  Pulse: 68  Weight: 251 lb 6.4 oz (114 kg)    Fetal Status: Fetal Heart Rate (bpm): 139   Movement: Present     General:  Alert, oriented and cooperative. Patient is in no acute distress.  Skin: Skin is warm and dry. No rash noted.   Cardiovascular: Normal heart rate noted  Respiratory: Normal respiratory effort, no problems with respiration noted  Abdomen: Soft, gravid, appropriate for gestational age.  Pain/Pressure: Present     Pelvic: Cervical exam deferred        Extremities: Normal range of motion.  Edema: None  Mental Status: Normal mood and affect. Normal behavior. Normal judgment and thought content.   Assessment and Plan:  Pregnancy: G2P1001 at [redacted]w[redacted]d 1. Supervision of other normal pregnancy, antepartum  - Prenat-Fe Poly-Methfol-FA-DHA (VITAFOL ULTRA) 29-0.6-0.4-200 MG CAPS; Take 1 capsule by mouth daily before breakfast.  Dispense: 90 capsule; Refill: 4  Preterm labor symptoms and general obstetric precautions including but not limited to vaginal bleeding, contractions, leaking of fluid and fetal movement were reviewed in detail  with the patient. Please refer to After Visit Summary for other counseling recommendations.  Supervision of other normal pregnancy, antepartum - Plan: Prenat-Fe Poly-Methfol-FA-DHA (VITAFOL ULTRA) 29-0.6-0.4-200 MG CAPS  S/P cesarean section  Gestational diabetes mellitus (GDM) in third trimester controlled on oral hypoglycemic drug  Obesity in pregnancy  Desires VBAC (vaginal birth after cesarean) trial  Return in about 1 week (around 10/19/2022). GDM control is stable Future Appointments  Date Time Provider Department Center  10/12/2022  1:15 PM [facility] NST [facility]  10/19/2022 10:35 AM Leftwich-Kirby, Wilmer Floor, CNM [facility] None  10/19/2022  1:15 PM [facility] NST [facility]  10/26/2022  9:55 AM Lennart Pall, MD [facility] None  10/26/2022  2:00 PM [facility] US1 [facility]  10/28/2022  6:30 AM [facility] SCHED ROOM [facility] None  11/02/2022 10:35 AM Constant, Gigi Gin, MD [facility] None  11/09/2022 10:35 AM Adam Phenix, MD [facility] None    Scheryl Darter, MD

## 2022-10-12 NOTE — Progress Notes (Signed)
Pt presents for ROB.  Induction on 7/31

## 2022-10-19 ENCOUNTER — Encounter: Payer: Self-pay | Admitting: Advanced Practice Midwife

## 2022-10-19 ENCOUNTER — Ambulatory Visit: Payer: Medicaid Other | Attending: Maternal & Fetal Medicine | Admitting: *Deleted

## 2022-10-19 ENCOUNTER — Ambulatory Visit (INDEPENDENT_AMBULATORY_CARE_PROVIDER_SITE_OTHER): Payer: Medicaid Other | Admitting: Advanced Practice Midwife

## 2022-10-19 VITALS — BP 121/79 | HR 66 | Wt 254.9 lb

## 2022-10-19 DIAGNOSIS — Z3A37 37 weeks gestation of pregnancy: Secondary | ICD-10-CM

## 2022-10-19 DIAGNOSIS — O24419 Gestational diabetes mellitus in pregnancy, unspecified control: Secondary | ICD-10-CM | POA: Insufficient documentation

## 2022-10-19 DIAGNOSIS — Z3480 Encounter for supervision of other normal pregnancy, unspecified trimester: Secondary | ICD-10-CM

## 2022-10-19 DIAGNOSIS — O24415 Gestational diabetes mellitus in pregnancy, controlled by oral hypoglycemic drugs: Secondary | ICD-10-CM

## 2022-10-19 DIAGNOSIS — O34219 Maternal care for unspecified type scar from previous cesarean delivery: Secondary | ICD-10-CM

## 2022-10-19 NOTE — Procedures (Signed)
Gloria Cox [birth year] [redacted]w[redacted]d  Fetus A Non-Stress Test Interpretation for 10/19/22  Indication: Gestational Diabetes medication controlled  Fetal Heart Rate A Mode: External Baseline Rate (A): 125 bpm Variability: Moderate Accelerations: 15 x 15 Decelerations: None Multiple birth?: No  Uterine Activity Mode: Palpation, Toco Contraction Frequency (min): none Resting Tone Palpated: Relaxed  Interpretation (Fetal Testing) Nonstress Test Interpretation: Reactive Overall Impression: Reassuring for gestational age Comments: Dr. Judeth Cornfield reviewed tracing

## 2022-10-19 NOTE — Progress Notes (Signed)
   PRENATAL VISIT NOTE  Subjective:  Gloria Cox is a 27 y.o. G2P1001 at [redacted]w[redacted]d being seen today for ongoing prenatal care.  She is currently monitored for the following issues for this high-risk pregnancy and has HSV-1 (herpes simplex virus 1) infection; Obesity in pregnancy; S/P cesarean section; Tobacco use; Supervision of other normal pregnancy, antepartum; and Gestational diabetes mellitus (GDM) in third trimester on their problem list.  Patient reports occasional contractions.  Contractions: Irritability. Vag. Bleeding: None.  Movement: Present. Denies leaking of fluid.   The following portions of the patient's history were reviewed and updated as appropriate: allergies, current medications, past family history, past medical history, past social history, past surgical history and problem list.   Objective:   Vitals:   10/19/22 1104  BP: 121/79  Pulse: 66  Weight: 254 lb 14.4 oz (115.6 kg)    Fetal Status: Fetal Heart Rate (bpm): 132 Fundal Height: 37 cm Movement: Present     General:  Alert, oriented and cooperative. Patient is in no acute distress.  Skin: Skin is warm and dry. No rash noted.   Cardiovascular: Normal heart rate noted  Respiratory: Normal respiratory effort, no problems with respiration noted  Abdomen: Soft, gravid, appropriate for gestational age.  Pain/Pressure: Present     Pelvic: Cervical exam deferred        Extremities: Normal range of motion.  Edema: Mild pitting, slight indentation  Mental Status: Normal mood and affect. Normal behavior. Normal judgment and thought content.   Assessment and Plan:  Pregnancy: G2P1001 at [redacted]w[redacted]d 1. Supervision of other normal pregnancy, antepartum --Anticipatory guidance about next visits/weeks of pregnancy given.   2. Gestational diabetes mellitus (GDM) in third trimester controlled on oral hypoglycemic drug --Fasting glucose values between 88 and 112, only 2 in last 2 weeks above 95.  PP all below 120 at 2 hours, pt  reports they are 140s at 1 hour after meals if she takes it early. --Good glucose control.  Pt very motivated to avoid diabetes later in life and to have a vaginal delivery this pregnancy. Encouraged pt to continue good habits.  3. Desires VBAC (vaginal birth after cesarean) trial --Consent signed --Reviewed benefits/risks again at pt request, questions answered  4. [redacted] weeks gestation of pregnancy   Term labor symptoms and general obstetric precautions including but not limited to vaginal bleeding, contractions, leaking of fluid and fetal movement were reviewed in detail with the patient. Please refer to After Visit Summary for other counseling recommendations.   No follow-ups on file.  Future Appointments  Date Time Provider Department Center  10/26/2022  9:55 AM Lennart Pall, MD [facility] None  10/26/2022  2:00 PM [facility] US1 [facility]  10/28/2022  6:30 AM [facility] SCHED ROOM [facility] None  11/02/2022 10:35 AM Constant, Gigi Gin, MD [facility] None  11/09/2022 10:35 AM Adam Phenix, MD [facility] None    Sharen Counter, CNM

## 2022-10-19 NOTE — Progress Notes (Signed)
Pt presents for ROB visit. Pt has concerns about VBAC.

## 2022-10-21 ENCOUNTER — Other Ambulatory Visit: Payer: Self-pay | Admitting: Advanced Practice Midwife

## 2022-10-21 ENCOUNTER — Encounter (HOSPITAL_COMMUNITY): Payer: Self-pay | Admitting: *Deleted

## 2022-10-21 ENCOUNTER — Telehealth (HOSPITAL_COMMUNITY): Payer: Self-pay | Admitting: *Deleted

## 2022-10-21 NOTE — Telephone Encounter (Signed)
Preadmission screen  

## 2022-10-24 ENCOUNTER — Other Ambulatory Visit: Payer: Self-pay | Admitting: Advanced Practice Midwife

## 2022-10-26 ENCOUNTER — Ambulatory Visit: Payer: Medicaid Other | Attending: Maternal & Fetal Medicine

## 2022-10-26 ENCOUNTER — Ambulatory Visit (INDEPENDENT_AMBULATORY_CARE_PROVIDER_SITE_OTHER): Payer: Medicaid Other | Admitting: Obstetrics and Gynecology

## 2022-10-26 VITALS — BP 113/78 | HR 72 | Wt 257.0 lb

## 2022-10-26 DIAGNOSIS — E669 Obesity, unspecified: Secondary | ICD-10-CM

## 2022-10-26 DIAGNOSIS — O34219 Maternal care for unspecified type scar from previous cesarean delivery: Secondary | ICD-10-CM

## 2022-10-26 DIAGNOSIS — O98813 Other maternal infectious and parasitic diseases complicating pregnancy, third trimester: Secondary | ICD-10-CM | POA: Diagnosis not present

## 2022-10-26 DIAGNOSIS — O24415 Gestational diabetes mellitus in pregnancy, controlled by oral hypoglycemic drugs: Secondary | ICD-10-CM

## 2022-10-26 DIAGNOSIS — B009 Herpesviral infection, unspecified: Secondary | ICD-10-CM | POA: Diagnosis not present

## 2022-10-26 DIAGNOSIS — Z3480 Encounter for supervision of other normal pregnancy, unspecified trimester: Secondary | ICD-10-CM

## 2022-10-26 DIAGNOSIS — Z98891 History of uterine scar from previous surgery: Secondary | ICD-10-CM

## 2022-10-26 DIAGNOSIS — Z3A38 38 weeks gestation of pregnancy: Secondary | ICD-10-CM

## 2022-10-26 DIAGNOSIS — O99213 Obesity complicating pregnancy, third trimester: Secondary | ICD-10-CM | POA: Diagnosis not present

## 2022-10-26 DIAGNOSIS — J45909 Unspecified asthma, uncomplicated: Secondary | ICD-10-CM

## 2022-10-26 DIAGNOSIS — O99513 Diseases of the respiratory system complicating pregnancy, third trimester: Secondary | ICD-10-CM

## 2022-10-26 NOTE — Progress Notes (Signed)
   PRENATAL VISIT NOTE  Subjective:  Gloria Cox is a 27 y.o. G2P1001 at [redacted]w[redacted]d being seen today for ongoing prenatal care.  She is currently monitored for the following issues for this high-risk pregnancy and has HSV-1 (herpes simplex virus 1) infection; Obesity in pregnancy; S/P cesarean section; Tobacco use; Supervision of other normal pregnancy, antepartum; and Gestational diabetes mellitus (GDM) in third trimester on their problem list.  Patient reports  doing well overall .  Contractions: Irregular. Vag. Bleeding: None.  Movement: Present. Denies leaking of fluid.   The following portions of the patient's history were reviewed and updated as appropriate: allergies, current medications, past family history, past medical history, past social history, past surgical history and problem list.   Objective:   Vitals:   10/26/22 1016  BP: 113/78  Pulse: 72  Weight: 257 lb (116.6 kg)   Fetal Status: Fetal Heart Rate (bpm): 135   Movement: Present     General:  Alert, oriented and cooperative. Patient is in no acute distress.  Skin: Skin is warm and dry. No rash noted.   Cardiovascular: Normal heart rate noted  Respiratory: Normal respiratory effort, no problems with respiration noted  Abdomen: Soft, gravid, appropriate for gestational age.  Pain/Pressure: Present      Assessment and Plan:  Pregnancy: G2P1001 at [redacted]w[redacted]d 1. Supervision of other normal pregnancy, antepartum 2. [redacted] weeks gestation of pregnancy IOL scheduled 7/31  3. S/P cesarean section Plans TOLAC, consent previously signed Discussed IOL - FB, AROM, pitocin; reviewed that cytotec cannot be safely administered  4. Gestational diabetes mellitus (GDM) in third trimester controlled on oral hypoglycemic drug Did not bring BG for review. Reports fasting in 90s and postprandial <120 Continue MTF 1000 BID Growth [place] scheduled for today, last AGA IOL scheduled for 39w (7/31)  Please refer to After Visit Summary for other  counseling recommendations.   Future Appointments  Date Time Provider Department Center  10/26/2022  2:00 PM [facility] US1 [facility]  10/28/2022  6:30 AM [facility] SCHED ROOM [facility] None  11/02/2022 10:35 AM Constant, Gigi Gin, MD [facility] None  11/09/2022 10:35 AM Adam Phenix, MD [facility] None   Lennart Pall, MD

## 2022-10-28 ENCOUNTER — Inpatient Hospital Stay (HOSPITAL_COMMUNITY): Payer: Medicaid Other | Admitting: Anesthesiology

## 2022-10-28 ENCOUNTER — Inpatient Hospital Stay (HOSPITAL_COMMUNITY)
Admission: RE | Admit: 2022-10-28 | Discharge: 2022-10-31 | DRG: 788 | Disposition: A | Discharge status: Home / Self Care | Payer: Medicaid Other | Attending: Family Medicine | Admitting: Family Medicine

## 2022-10-28 ENCOUNTER — Other Ambulatory Visit: Payer: Self-pay

## 2022-10-28 ENCOUNTER — Encounter (HOSPITAL_COMMUNITY): Payer: Self-pay | Admitting: Obstetrics and Gynecology

## 2022-10-28 ENCOUNTER — Inpatient Hospital Stay (HOSPITAL_COMMUNITY): Payer: Medicaid Other

## 2022-10-28 DIAGNOSIS — O9832 Other infections with a predominantly sexual mode of transmission complicating childbirth: Secondary | ICD-10-CM | POA: Diagnosis not present

## 2022-10-28 DIAGNOSIS — O24424 Gestational diabetes mellitus in childbirth, insulin controlled: Secondary | ICD-10-CM | POA: Diagnosis not present

## 2022-10-28 DIAGNOSIS — Z3A39 39 weeks gestation of pregnancy: Secondary | ICD-10-CM

## 2022-10-28 DIAGNOSIS — O34211 Maternal care for low transverse scar from previous cesarean delivery: Secondary | ICD-10-CM | POA: Diagnosis not present

## 2022-10-28 DIAGNOSIS — A6000 Herpesviral infection of urogenital system, unspecified: Secondary | ICD-10-CM | POA: Diagnosis present

## 2022-10-28 DIAGNOSIS — O24415 Gestational diabetes mellitus in pregnancy, controlled by oral hypoglycemic drugs: Secondary | ICD-10-CM | POA: Diagnosis present

## 2022-10-28 DIAGNOSIS — Z87891 Personal history of nicotine dependence: Secondary | ICD-10-CM | POA: Diagnosis not present

## 2022-10-28 DIAGNOSIS — O99334 Smoking (tobacco) complicating childbirth: Secondary | ICD-10-CM | POA: Diagnosis not present

## 2022-10-28 DIAGNOSIS — O34219 Maternal care for unspecified type scar from previous cesarean delivery: Secondary | ICD-10-CM | POA: Diagnosis not present

## 2022-10-28 DIAGNOSIS — O134 Gestational [pregnancy-induced] hypertension without significant proteinuria, complicating childbirth: Secondary | ICD-10-CM | POA: Diagnosis not present

## 2022-10-28 DIAGNOSIS — Z3490 Encounter for supervision of normal pregnancy, unspecified, unspecified trimester: Secondary | ICD-10-CM

## 2022-10-28 DIAGNOSIS — O99214 Obesity complicating childbirth: Secondary | ICD-10-CM | POA: Diagnosis present

## 2022-10-28 DIAGNOSIS — Z98891 History of uterine scar from previous surgery: Principal | ICD-10-CM

## 2022-10-28 DIAGNOSIS — O99210 Obesity complicating pregnancy, unspecified trimester: Secondary | ICD-10-CM | POA: Diagnosis present

## 2022-10-28 DIAGNOSIS — O24425 Gestational diabetes mellitus in childbirth, controlled by oral hypoglycemic drugs: Principal | ICD-10-CM | POA: Diagnosis present

## 2022-10-28 DIAGNOSIS — B009 Herpesviral infection, unspecified: Secondary | ICD-10-CM | POA: Diagnosis present

## 2022-10-28 DIAGNOSIS — Z720 Tobacco use: Secondary | ICD-10-CM | POA: Diagnosis present

## 2022-10-28 LAB — COMPREHENSIVE METABOLIC PANEL
ALT: 46 U/L — ABNORMAL HIGH (ref 0–44)
AST: 20 U/L (ref 15–41)
Albumin: 2.6 g/dL — ABNORMAL LOW (ref 3.5–5.0)
Alkaline Phosphatase: 172 U/L — ABNORMAL HIGH (ref 38–126)
Anion gap: 12 (ref 5–15)
BUN: 10 mg/dL (ref 6–20)
CO2: 16 mmol/L — ABNORMAL LOW (ref 22–32)
Calcium: 8.7 mg/dL — ABNORMAL LOW (ref 8.9–10.3)
Chloride: 105 mmol/L (ref 98–111)
Creatinine, Ser: 0.7 mg/dL (ref 0.44–1.00)
GFR, Estimated: >60 mL/min (ref >60)
Glucose, Bld: 99 mg/dL (ref 70–99)
Potassium: 3.3 mmol/L — ABNORMAL LOW (ref 3.5–5.1)
Sodium: 133 mmol/L — ABNORMAL LOW (ref 135–145)
Total Bilirubin: 0.4 mg/dL (ref 0.3–1.2)
Total Protein: 6.1 g/dL — ABNORMAL LOW (ref 6.5–8.1)

## 2022-10-28 LAB — TYPE AND SCREEN
ABO/RH(D): O POS
Antibody Screen: NEGATIVE

## 2022-10-28 LAB — CBC
HCT: 35.1 % — ABNORMAL LOW (ref 36.0–46.0)
Hemoglobin: 11.3 g/dL — ABNORMAL LOW (ref 12.0–15.0)
MCH: 27.6 pg (ref 26.0–34.0)
MCHC: 32.2 g/dL (ref 30.0–36.0)
MCV: 85.8 fL (ref 80.0–100.0)
Platelets: 191 10*3/uL (ref 150–400)
RBC: 4.09 MIL/uL (ref 3.87–5.11)
RDW: 13.9 % (ref 11.5–15.5)
WBC: 11.8 10*3/uL — ABNORMAL HIGH (ref 4.0–10.5)
nRBC: 0 % (ref 0.0–0.2)

## 2022-10-28 LAB — GLUCOSE, CAPILLARY
Glucose-Capillary: 87 mg/dL (ref 70–99)
Glucose-Capillary: 85 mg/dL (ref 70–99)
Glucose-Capillary: 79 mg/dL (ref 70–99)
Glucose-Capillary: 79 mg/dL (ref 70–99)

## 2022-10-28 LAB — RPR: RPR Ser Ql: NONREACTIVE

## 2022-10-28 MED ORDER — LACTATED RINGERS IV SOLN: INTRAVENOUS | Status: DC

## 2022-10-28 MED ORDER — OXYTOCIN-SODIUM CHLORIDE 30-0.9 UT/500ML-% IV SOLN: 2.5000 [IU]/h | INTRAVENOUS | Status: DC

## 2022-10-28 MED ORDER — ONDANSETRON HCL 4 MG/2ML IJ SOLN
4.0000 mg | Freq: Four times a day (QID) | INTRAMUSCULAR | Status: DC | PRN
  Administered 2022-10-28 – 2022-10-29 (×2): 4 mg via INTRAVENOUS
  Filled 2022-10-28 (×2): qty 2

## 2022-10-28 MED ORDER — EPHEDRINE 5 MG/ML INJ: 10.0000 mg | INTRAVENOUS | Status: DC | PRN

## 2022-10-28 MED ORDER — LIDOCAINE HCL (PF) 1 % IJ SOLN: 30.0000 mL | INTRAMUSCULAR | Status: DC | PRN

## 2022-10-28 MED ORDER — PHENYLEPHRINE 80 MCG/ML (10ML) SYRINGE FOR IV PUSH (FOR BLOOD PRESSURE SUPPORT)
80.0000 ug | PREFILLED_SYRINGE | INTRAVENOUS | Status: DC | PRN
  Administered 2022-10-29 (×2): 80 ug via INTRAVENOUS

## 2022-10-28 MED ORDER — SOD CITRATE-CITRIC ACID 500-334 MG/5ML PO SOLN
30.0000 mL | ORAL | Status: DC | PRN
  Administered 2022-10-29: 30 mL via ORAL
  Filled 2022-10-28: qty 30

## 2022-10-28 MED ORDER — OXYTOCIN BOLUS FROM INFUSION: 333.0000 mL | Freq: Once | INTRAVENOUS | Status: DC

## 2022-10-28 MED ORDER — LACTATED RINGERS IV SOLN
500.0000 mL | INTRAVENOUS | Status: DC | PRN
  Administered 2022-10-29: 250 mL via INTRAVENOUS
  Administered 2022-10-29 (×2): 500 mL via INTRAVENOUS

## 2022-10-28 MED ORDER — FENTANYL CITRATE (PF) 100 MCG/2ML IJ SOLN
INTRAMUSCULAR | Status: DC | PRN
  Administered 2022-10-28 – 2022-10-29 (×2): 100 ug via EPIDURAL
  Administered 2022-10-29: 50 ug via EPIDURAL
  Administered 2022-10-29: 100 ug via EPIDURAL
  Administered 2022-10-29: 50 ug via EPIDURAL

## 2022-10-28 MED ORDER — LIDOCAINE HCL (PF) 1 % IJ SOLN
INTRAMUSCULAR | Status: DC | PRN
  Administered 2022-10-28 (×2): 4 mL via EPIDURAL

## 2022-10-28 MED ORDER — ACETAMINOPHEN 325 MG PO TABS: 650.0000 mg | ORAL_TABLET | ORAL | Status: DC | PRN

## 2022-10-28 MED ORDER — TERBUTALINE SULFATE 1 MG/ML IJ SOLN
0.2500 mg | Freq: Once | INTRAMUSCULAR | Status: AC | PRN
  Administered 2022-10-29: 0.25 mg via SUBCUTANEOUS
  Filled 2022-10-28: qty 1

## 2022-10-28 MED ORDER — FENTANYL-BUPIVACAINE-NACL 0.5-0.125-0.9 MG/250ML-% EP SOLN
12.0000 mL/h | EPIDURAL | Status: DC | PRN
  Administered 2022-10-28: 12 mL/h via EPIDURAL
  Filled 2022-10-28 (×2): qty 250

## 2022-10-28 MED ORDER — DIPHENHYDRAMINE HCL 50 MG/ML IJ SOLN: 12.5000 mg | INTRAMUSCULAR | Status: DC | PRN

## 2022-10-28 MED ORDER — PHENYLEPHRINE 80 MCG/ML (10ML) SYRINGE FOR IV PUSH (FOR BLOOD PRESSURE SUPPORT)
80.0000 ug | PREFILLED_SYRINGE | INTRAVENOUS | Status: DC | PRN
  Filled 2022-10-28: qty 10

## 2022-10-28 MED ORDER — BUPIVACAINE HCL (PF) 0.25 % IJ SOLN
INTRAMUSCULAR | Status: DC | PRN
  Administered 2022-10-28 – 2022-10-29 (×2): 8 mL via EPIDURAL
  Administered 2022-10-29 (×2): 4 mL via EPIDURAL

## 2022-10-28 MED ORDER — FENTANYL CITRATE (PF) 100 MCG/2ML IJ SOLN
50.0000 ug | INTRAMUSCULAR | Status: DC | PRN
  Administered 2022-10-28 – 2022-10-29 (×3): 100 ug via INTRAVENOUS
  Filled 2022-10-28 (×5): qty 2

## 2022-10-28 MED ORDER — LACTATED RINGERS IV SOLN
500.0000 mL | Freq: Once | INTRAVENOUS | Status: AC
  Administered 2022-10-28: 500 mL via INTRAVENOUS

## 2022-10-28 MED ORDER — OXYCODONE-ACETAMINOPHEN 5-325 MG PO TABS: 1.0000 | ORAL_TABLET | ORAL | Status: DC | PRN

## 2022-10-28 MED ORDER — OXYTOCIN-SODIUM CHLORIDE 30-0.9 UT/500ML-% IV SOLN
1.0000 m[IU]/min | INTRAVENOUS | Status: DC
  Administered 2022-10-28: 2 m[IU]/min via INTRAVENOUS
  Filled 2022-10-28: qty 500

## 2022-10-28 NOTE — Progress Notes (Signed)
Labor Progress Note  Gloria Cox is a 27 y.o. G2P1001 at [redacted]w[redacted]d presented for IOL [name]  S: Patient is feeling her contractions.  Would like Foley bulb placed.  On 22 of Pitocin.  O:  BP 132/77   Pulse 77   Temp 97.8 F (36.6 C) (Axillary)   Resp 15   Ht 5\' 3"  (1.6 m)   Wt 116 kg   LMP 01/28/2022 (Approximate)   BMI 45.30 kg/m  EFM: 120 bpm/Moderate variability/ 15x15 accels/ None decels CAT: 1 Toco: regular, every 2-4 minutes   CVE: Dilation: 3 Effacement (%): 50 Cervical Position: Posterior Station: -3 Presentation: Vertex Exam by:: Dr. Lanae Crumbly   A&P: 27 y.o. G2P1001 [redacted]w[redacted]d  here for IOL as above  #Labor: Progressing well.  Foley bulb placed, Pitocin reduced to 6 milliunits/min.  Shortly after Foley bulb was placed, it came out, patient was then 3 to 3-1/2 cm dilated.  AROM performed, clear IUPC placed. #Pain: Nitrous oxide, Family/Friend support, and PO/IV pain meds #FWB: CAT 1 #GBS negative # [name]: CBG every 4 hours, last CBG 87.  CTM.  Myrtie Hawk, DO FMOB Fellow, Faculty practice [facility], Center for [facility] 10/28/22  3:10 PM

## 2022-10-28 NOTE — Progress Notes (Signed)
Patient ID: Gloria Cox, female   DOB: 1996/03/29, 27 y.o.   MRN: [id number]  Pt just got comfortable enough to rest> not disturbed; chart review shows some elevated BPs (#7), possibly taken w ctx  BPs 138/73, 150/75, 141/75, 133/84 FHR 130s, +accels, occ mi variables Ctx irreg 2-4 mins with Pit at 39mu/min; MVUs not adequate, ~120 Cx deferred  CBGs all <100  IUP@39 .0wks IOL process TOLAC GDMA2 ^BPs  Will continue to follow BPs and collect labs for possible dx of gHTN Plan to check cx/place urinary foley once she awakens Hopeful for vag delivery  Gloria Cox [facility] 10/28/2022 7:12 PM

## 2022-10-28 NOTE — Anesthesia Procedure Notes (Signed)
Epidural Patient location during procedure: OB Start time: 10/28/2022 5:31 PM End time: 10/28/2022 5:37 PM  Staffing Anesthesiologist: Kaylyn Layer, MD Performed: anesthesiologist   Preanesthetic Checklist Completed: patient identified, IV checked, risks and benefits discussed, monitors and equipment checked, pre-op evaluation and timeout performed  Epidural Patient position: sitting Prep: DuraPrep and site prepped and draped Patient monitoring: continuous pulse ox, blood pressure and heart rate Approach: midline Location: L3-L4 Injection technique: LOR air  Needle:  Needle type: Tuohy  Needle gauge: 17 G Needle length: 9 cm Needle insertion depth: 7 cm Catheter type: closed end flexible Catheter size: 19 Gauge Catheter at skin depth: 12 cm Test dose: negative and Other (1% lidocaine)  Assessment Events: blood not aspirated, injection not painful, no injection resistance, no paresthesia and negative IV test  Additional Notes Patient identified. Risks, benefits, and alternatives discussed with patient including but not limited to bleeding, infection, nerve damage, paralysis, failed block, incomplete pain control, headache, blood pressure changes, nausea, vomiting, reactions to medication, itching, and postpartum back pain. Confirmed with bedside nurse the patient's most recent platelet count. Confirmed with patient that they are not currently taking any anticoagulation, have any bleeding history, or any family history of bleeding disorders. Patient expressed understanding and wished to proceed. All questions were answered. Sterile technique was used throughout the entire procedure. Please see nursing notes for vital signs.   Crisp LOR on first pass. DPE technique with clear CSF (performed d/t hx of unilateral epidural). Catheter threaded easily but would not flush. Catheter removed and replaced at same location, now flushes easily. Test dose was given through epidural catheter  and negative prior to continuing to dose epidural or start infusion. Warning signs of high block given to the patient including shortness of breath, tingling/numbness in hands, complete motor block, or any concerning symptoms with instructions to call for help. Patient was given instructions on fall risk and not to get out of bed. All questions and concerns addressed with instructions to call with any issues or inadequate analgesia.  Reason for block:procedure for pain

## 2022-10-28 NOTE — Progress Notes (Signed)
Labor Progress Note  Gloria Cox is a 27 y.o. G2P1001 at [redacted]w[redacted]d presented for IOL [name]  S: Patient is doing well, feeling her contractions, still declines Foley bulb until later in her labor  O:  BP (!) 102/53 (BP Location: Right Arm)   Pulse 61   Temp (!) 97.4 F (36.3 C) (Oral)   Resp 15   Ht 5\' 3"  (1.6 m)   Wt 116 kg   LMP 01/28/2022 (Approximate)   BMI 45.30 kg/m  EFM: 120 bpm/Moderate variability/ 15x15 accels/ None decels CAT: 1 Toco: regular, every 2-4 minutes   CVE: Dilation: 1 Effacement (%): 50 Cervical Position: Posterior Station: Ballotable Presentation: Vertex Exam by:: Dr. Camelia Phenes   A&P: 27 y.o. G2P1001 [redacted]w[redacted]d  here for IOL as above  #Labor: Progressing well.  Continue increasing Pitocin, plan Foley bulb at next SVE #Pain: Nitrous oxide, Family/Friend support, and PO/IV pain meds #FWB: CAT 1 #GBS negative # [name]: CBG every 4 hours, last CBG 87.  CTM.  Myrtie Hawk, DO FMOB Fellow, Faculty practice [facility], Center for [facility] 10/28/22  10:53 AM

## 2022-10-28 NOTE — Anesthesia Preprocedure Evaluation (Signed)
Anesthesia Evaluation  Patient identified by MRN, date of birth, ID band Patient awake    Reviewed: Allergy & Precautions, Patient's Chart, lab work & pertinent test results  History of Anesthesia Complications Negative for: history of anesthetic complications  Airway Mallampati: II  TM Distance: >3 FB Neck ROM: Full    Dental no notable dental hx.    Pulmonary asthma , former smoker   Pulmonary exam normal        Cardiovascular negative cardio ROS Normal cardiovascular exam     Neuro/Psych negative neurological ROS  negative psych ROS   GI/Hepatic negative GI ROS, Neg liver ROS,,,  Endo/Other  diabetes, GestationalHypothyroidism    Renal/GU negative Renal ROS  negative genitourinary   Musculoskeletal negative musculoskeletal ROS (+)    Abdominal   Peds  Hematology negative hematology ROS (+)   Anesthesia Other Findings Day of surgery medications reviewed with patient.  Reproductive/Obstetrics (+) Pregnancy                              Anesthesia Physical Anesthesia Plan  ASA: 3  Anesthesia Plan: Epidural   Post-op Pain Management:    Induction:   PONV Risk Score and Plan: Treatment may vary due to age or medical condition  Airway Management Planned: Natural Airway  Additional Equipment: Fetal Monitoring  Intra-op Plan:   Post-operative Plan:   Informed Consent: I have reviewed the patients History and Physical, chart, labs and discussed the procedure including the risks, benefits and alternatives for the proposed anesthesia with the patient or authorized representative who has indicated his/her understanding and acceptance.       Plan Discussed with:   Anesthesia Plan Comments:          Anesthesia Quick Evaluation

## 2022-10-28 NOTE — Progress Notes (Signed)
Pit increased to 12 at 1630. RN unable to increase pit again until shift change d/t pt vomiting, needing epidural, epidural not being completely effective until anesthesia redose at 1842. CTX are spacing. Notified CNM.

## 2022-10-28 NOTE — Inpatient Diabetes Management (Signed)
Inpatient Diabetes Program Recommendations  Diabetes Treatment Program Recommendations  ADA Standards of Care Diabetes in Pregnancy Target Glucose Ranges:  Fasting: 70 - 95 mg/dL 1 hr postprandial: Less than 140mg /dL (from first bite of meal) 2 hr postprandial: Less than 120 mg/dL (from first bite of meal)      Latest Reference Range & Units 10/28/22 08:04  Glucose-Capillary 70 - 99 mg/dL 87    Latest Reference Range & Units 10/28/22 06:16  Glucose 70 - 99 mg/dL 99   Review of Glycemic Control  Diabetes history: GDM; 39W Outpatient Diabetes medications: Metformin XR 1000 mg BID Current orders for Inpatient glycemic control: CBGs Q4H  Inpatient Diabetes Program Recommendations:    Insulin: May want to consider using Diabetes Treatment for Pregnant/Postpartum Patients order set to order Novolog 0-14 units Q4H and if CBGs become consistently over 120 mg/dl, would recommend to use IV insulin per [employer].   Thanks, Orlando Penner, RN, MSN, CDCES Diabetes Coordinator Inpatient Diabetes Program [phone] (Team Pager from 8am to 5pm)

## 2022-10-28 NOTE — H&P (Signed)
OBSTETRIC ADMISSION HISTORY AND PHYSICAL  Gloria Cox is a 27 y.o. female G2P1001 with IUP at [redacted]w[redacted]d by LMP presenting for induction of labor for [name]. She reports +FMs, No LOF, no VB, no blurry vision, headaches or peripheral edema, and RUQ pain.  She plans on breast feeding. She request Nexplanon for birth control. She received her prenatal care at [facility]  Dating: By LMP --->  Estimated Date of Delivery: 11/04/22  Sono:    @38w  6 d, CWD, normal anatomy, cephalic presentation, 3432 g, 52% EFW   Prenatal History/Complications:   Patient Active Problem List   Diagnosis Date Noted   Gestational diabetes mellitus (GDM) in third trimester controlled on oral hypoglycemic drug 10/28/2022   Gestational diabetes mellitus (GDM) in third trimester 09/08/2022   Supervision of other normal pregnancy, antepartum 04/13/2022   Tobacco use 12/17/2021   S/P cesarean section 09/06/2021   Obesity in pregnancy 02/16/2021   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 Serial growth scan as per MFM  Flu Vaccine  12/29/21 Genetic/Carrier Screen  NIPS:   LR AFP:   neg Horizon:  TDaP Vaccine   08/18/2022 Hgb A1C or  GTT Early 5.4 Third trimester abnormal  COVID Vaccine Not vaccinated   LAB RESULTS   Rhogam  --/--/O POS (07/31 7829)  Blood Type --/--/O POS (07/31 0615)   Baby Feeding Plan Breast Antibody NEG (07/31 0615)  Contraception  Pills or NuvaRing? Rubella 5.23 (01/15 1623)  Circumcision Yes  RPR NON REACTIVE (07/31 0616)   Pediatrician  [facility] HBsAg Negative (01/15 1623)   Support Person FOB-Jordan HCVAb Non Reactive (01/15 1623)   Prenatal Classes  HIV Non Reactive (05/21 1051)     BTL Consent  GBS   neg  VBAC Consent 07/13/22 Pap Diagnosis  Date Value Ref Range  Status  05/21/2022      - Negative for intraepithelial lesion or malignancy (NILM)         DME Rx Arly.Keller ] BP cuff Arly.Keller ] Weight Scale Waterbirth  [ ]  Class [ ]  Consent [ ]  CNM visit  PHQ9 & GAD7 [ X ] new OB [ X ] 28 weeks  [ X ] 36 weeks Induction  [ ]  Orders Entered [ ] Foley Y/N     Past Medical History: Past Medical History:  Diagnosis Date   Asthma    Dyspepsia    Gestational diabetes    Goiter    History of asthma 02/16/2021   Hyperlipemia 05/09/2013   Hypothyroidism    Macromastia 02/20/2014   Migraine without aura and without status migrainosus, not intractable 02/14/2015   [place]    Pre-diabetes    Striae    Thyroiditis, autoimmune     Past Surgical History: Past Surgical History:  Procedure Laterality Date   CESAREAN SECTION N/A 09/06/2021   Procedure: CESAREAN SECTION;  Surgeon: Glencoe Bing, MD;  Location: [facility] LD ORS;  Service: Obstetrics;  Laterality: N/A;   CHOLECYSTECTOMY N/A 12/18/2015   Procedure: LAPAROSCOPIC CHOLECYSTECTOMY;  Surgeon: Emelia Loron, MD;  Location: [facility] ORS;  Service: General;  Laterality: N/A;   WISDOM TOOTH EXTRACTION  03/30/2012    Obstetrical History: OB History     Gravida  2   Para  1   Term  1   Preterm      AB      Living  1      SAB      IAB      Ectopic      Multiple  0   Live Births  1           Social History Social History   Socioeconomic History   Marital status: Single    Spouse name: Not on file   Number of children: Not on file   Years of education: Not on file   Highest education level: Not on file  Occupational History   Not on file  Tobacco Use   Smoking status: Former    Current packs/day: 0.00    Types: Cigarettes    Quit date: 03/09/2018    Years since quitting: 4.6   Smokeless tobacco: Never  Vaping Use   Vaping status: Former   Quit date: 01/11/2022   Substances: Nicotine  Substance and Sexual Activity   Alcohol use: Not Currently    Comment: not since confirmed  pregnancy   Drug use: Not Currently    Types: Marijuana    Comment: none for years   Sexual activity: Yes    Partners: Male    Birth control/protection: None  Other Topics Concern   Not on file  Social History Narrative   Not on file   Social Determinants of Health   Financial Resource Strain: Not on file  Food Insecurity: No Food Insecurity (10/28/2022)   Hunger Vital Sign    Worried About Running Out of Food in the Last Year: Never true    Ran Out of Food in the Last Year: Never true  Transportation Needs: No Transportation Needs (10/28/2022)   PRAPARE - Administrator, Civil Service (Medical): No    Lack of Transportation (Non-Medical): No  Physical Activity: Not on file  Stress: Not on file  Social Connections: Not on file    Family History: Family History  Problem Relation Age of Onset   Obesity Mother    Hypertension Father    Stroke Father    Obesity Sister    Obesity Brother    Diabetes Maternal Grandmother    Hypertension Maternal Grandmother    Obesity Maternal Grandmother    Diabetes Maternal Grandfather    Hypertension Maternal Grandfather    Heart Problems Maternal Grandfather 25   Diabetes Paternal Grandmother    Hypertension Paternal Grandmother    Diabetes Paternal Grandfather    Hypertension Paternal Grandfather     Allergies: No Known Allergies  Medications Prior to Admission  Medication Sig Dispense Refill Last Dose   Accu-Chek Softclix Lancets lancets 100 each by Other route 4 (four) times daily. 100 each 12    acetaminophen (TYLENOL) 500 MG tablet Take 2 tablets (1,000 mg total) by mouth every 6 (six) hours. 30 tablet 0    albuterol (PROVENTIL) (2.5 MG/3ML) 0.083% nebulizer solution Take 3 mLs (2.5 mg total) by nebulization every 6 (six) hours as needed for wheezing or shortness of breath. 150 mL 1    albuterol (VENTOLIN HFA) 108 (90 Base) MCG/ACT inhaler INHALE 2 PUFFS EVERY 4 HOURS BY INHALATION ROUTE. 18 each 3    Blood Glucose  Monitoring Suppl (ACCU-CHEK GUIDE) w/Device KIT 1 kit by Does not apply route daily. (Patient not taking: Reported on 10/12/2022) 1 kit 0    budesonide-formoterol (SYMBICORT) 160-4.5 MCG/ACT inhaler Inhale 2 puffs into the lungs 2 (two) times daily. (  Patient not taking: Reported on 10/12/2022) 1 each 3    cetirizine (ZYRTEC ALLERGY) 10 MG tablet Take 1 tablet (10 mg total) by mouth daily. 30 tablet 11    ciclopirox (LOPROX) 0.77 % cream Apply topically 2 (two) times daily. 90 g 2    EXCEDRIN MIGRAINE 250-250-65 MG tablet Take 1 tablet by mouth every 6 (six) hours as needed for headache or migraine.      glucose blood (ACCU-CHEK GUIDE) test strip Use as instructed 100 each 12    metFORMIN (GLUCOPHAGE-XR) 500 MG 24 hr tablet Take 2 tablets (1,000 mg total) by mouth 2 (two) times daily with a meal. 120 tablet 5    metroNIDAZOLE (FLAGYL) 500 MG tablet Take 1 tablet (500 mg total) by mouth 2 (two) times daily. 14 tablet 0    Misc. Devices (GOJJI WEIGHT SCALE) MISC 1 Device by Does not apply route every 30 (thirty) days. 1 each 0    MUCINEX SINUS-MAX CONG & PAIN 10-5-325 MG CAPS Take 1 capsule by mouth every 6 (six) hours as needed (for congestion). (Patient not taking: Reported on 09/18/2022)      pantoprazole (PROTONIX) 40 MG tablet Take 1 tablet (40 mg total) by mouth 2 (two) times daily before a meal. 60 tablet 5    Prenat-Fe Poly-Methfol-FA-DHA (VITAFOL ULTRA) 29-0.6-0.4-200 MG CAPS Take 1 capsule by mouth daily before breakfast. 90 capsule 4    terconazole (TERAZOL 3) 0.8 % vaginal cream Place 1 applicator vaginally at bedtime. 20 g 0    terconazole (TERAZOL 7) 0.4 % vaginal cream Place 1 applicator vaginally at bedtime. 45 g 0      Review of Systems   All systems reviewed and negative except as stated in HPI  Blood pressure (!) 102/53, pulse 61, temperature (!) 97.4 F (36.3 C), temperature source Oral, resp. rate 15, height 5\' 3"  (1.6 m), weight 116 kg, last menstrual period 01/28/2022, not  currently breastfeeding. General appearance: alert, cooperative, and appears stated age Lungs: clear to auscultation bilaterally Heart: regular rate and rhythm Abdomen: soft, non-tender; bowel sounds normal Pelvic: No lesions Extremities: Homans sign is negative, no sign of DVT  Presentation: cephalic Fetal monitoringBaseline: 130 bpm, Variability: Good {> 6 bpm), Accelerations: Reactive, and Decelerations: Absent Uterine activity contractions every 3 to 5 minutes Dilation: Fingertip Effacement (%): Thick Station: -3 Exam by:: Esther Hardy RN   Prenatal labs: ABO, Rh: --/--/O POS (07/31 0615) Antibody: NEG (07/31 0615) Rubella: 5.23 (01/15 1623) RPR: NON REACTIVE (07/31 0616)  HBsAg: Negative (01/15 1623)  HIV: Non Reactive (05/21 1051)  GBS: Negative/-- (07/08 1108)  1 hr Glucola abnormal Genetic screening normal Anatomy US normal  Prenatal Transfer Tool  Maternal Diabetes: Yes:  Diabetes Type:  Insulin/Medication controlled Genetic Screening: Normal Maternal Ultrasounds/Referrals: Normal Fetal Ultrasounds or other Referrals:  None Maternal Substance Abuse:  No Significant Maternal Medications:  None Significant Maternal Lab Results:  Group B Strep negative Number of Prenatal Visits:greater than 3 verified prenatal visits Other Comments:  None  Results for orders placed or performed during the hospital encounter of 10/28/22 (from the past 24 hour(s))  Type and screen   Collection Time: 10/28/22  6:15 AM  Result Value Ref Range   ABO/RH(D) O POS    Antibody Screen NEG    Sample Expiration      10/31/2022,2359 Performed at [facility], [address], [place], [place] [address]   CBC   Collection Time: 10/28/22  6:16 AM  Result Value Ref  Range   WBC 11.8 (H) 4.0 - 10.5 K/uL   RBC 4.09 3.87 - 5.11 MIL/uL   Hemoglobin 11.3 (L) 12.0 - 15.0 g/dL   HCT 40.9 (L) 81.1 - 91.4 %   MCV 85.8 80.0 - 100.0 fL   MCH 27.6 26.0 - 34.0 pg   MCHC 32.2 30.0 - 36.0 g/dL    RDW 78.2 95.6 - 21.3 %   Platelets 191 150 - 400 K/uL   nRBC 0.0 0.0 - 0.2 %  RPR   Collection Time: 10/28/22  6:16 AM  Result Value Ref Range   RPR Ser Ql NON REACTIVE NON REACTIVE  Comprehensive metabolic panel   Collection Time: 10/28/22  6:16 AM  Result Value Ref Range   Sodium 133 (L) 135 - 145 mmol/L   Potassium 3.3 (L) 3.5 - 5.1 mmol/L   Chloride 105 98 - 111 mmol/L   CO2 16 (L) 22 - 32 mmol/L   Glucose, Bld 99 70 - 99 mg/dL   BUN 10 6 - 20 mg/dL   Creatinine, Ser 0.86 0.44 - 1.00 mg/dL   Calcium 8.7 (L) 8.9 - 10.3 mg/dL   Total Protein 6.1 (L) 6.5 - 8.1 g/dL   Albumin 2.6 (L) 3.5 - 5.0 g/dL   AST 20 15 - 41 U/L   ALT 46 (H) 0 - 44 U/L   Alkaline Phosphatase 172 (H) 38 - 126 U/L   Total Bilirubin 0.4 0.3 - 1.2 mg/dL   GFR, Estimated >57 >84 mL/min   Anion gap 12 5 - 15  Glucose, capillary   Collection Time: 10/28/22  8:04 AM  Result Value Ref Range   Glucose-Capillary 87 70 - 99 mg/dL    Patient Active Problem List   Diagnosis Date Noted   Gestational diabetes mellitus (GDM) in third trimester controlled on oral hypoglycemic drug 10/28/2022   Gestational diabetes mellitus (GDM) in third trimester 09/08/2022   Supervision of other normal pregnancy, antepartum 04/13/2022   Tobacco use 12/17/2021   S/P cesarean section 09/06/2021   Obesity in pregnancy 02/16/2021   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 G2P1001 at [redacted]w[redacted]d here for IOL [name], [place]  #Labor: On Pitocin 2 x 2, patient would rather wait on Foley bulb until next cervical exam #Pain: Per patient request #FWB: Category 1 #ID:  GBS negative #MOF: Breast #MOC: Likely Nexplanon #Circ:  Yes  Myrtie Hawk, DO  10/28/2022, 10:28 AM

## 2022-10-29 ENCOUNTER — Encounter (HOSPITAL_COMMUNITY)
Admission: RE | Disposition: A | Discharge status: Home / Self Care | Payer: Self-pay | Source: Home / Self Care | Attending: Family Medicine

## 2022-10-29 ENCOUNTER — Encounter (HOSPITAL_COMMUNITY): Payer: Self-pay | Admitting: Obstetrics and Gynecology

## 2022-10-29 DIAGNOSIS — O134 Gestational [pregnancy-induced] hypertension without significant proteinuria, complicating childbirth: Secondary | ICD-10-CM

## 2022-10-29 DIAGNOSIS — O24424 Gestational diabetes mellitus in childbirth, insulin controlled: Secondary | ICD-10-CM

## 2022-10-29 DIAGNOSIS — O9832 Other infections with a predominantly sexual mode of transmission complicating childbirth: Secondary | ICD-10-CM

## 2022-10-29 DIAGNOSIS — O99214 Obesity complicating childbirth: Secondary | ICD-10-CM

## 2022-10-29 DIAGNOSIS — O99334 Smoking (tobacco) complicating childbirth: Secondary | ICD-10-CM

## 2022-10-29 DIAGNOSIS — Z3A39 39 weeks gestation of pregnancy: Secondary | ICD-10-CM

## 2022-10-29 DIAGNOSIS — O34211 Maternal care for low transverse scar from previous cesarean delivery: Secondary | ICD-10-CM

## 2022-10-29 LAB — PROTEIN / CREATININE RATIO, URINE
Creatinine, Urine: 34 mg/dL
Total Protein, Urine: <6 mg/dL

## 2022-10-29 LAB — GLUCOSE, CAPILLARY
Glucose-Capillary: 106 mg/dL — ABNORMAL HIGH (ref 70–99)
Glucose-Capillary: 110 mg/dL — ABNORMAL HIGH (ref 70–99)
Glucose-Capillary: 123 mg/dL — ABNORMAL HIGH (ref 70–99)
Glucose-Capillary: 80 mg/dL (ref 70–99)
Glucose-Capillary: 87 mg/dL (ref 70–99)

## 2022-10-29 LAB — CBC
HCT: 34.5 % — ABNORMAL LOW (ref 36.0–46.0)
Hemoglobin: 11.1 g/dL — ABNORMAL LOW (ref 12.0–15.0)
MCH: 27.8 pg (ref 26.0–34.0)
MCHC: 32.2 g/dL (ref 30.0–36.0)
MCV: 86.3 fL (ref 80.0–100.0)
Platelets: 159 10*3/uL (ref 150–400)
RBC: 4 MIL/uL (ref 3.87–5.11)
RDW: 13.8 % (ref 11.5–15.5)
WBC: 17.2 10*3/uL — ABNORMAL HIGH (ref 4.0–10.5)
nRBC: 0 % (ref 0.0–0.2)

## 2022-10-29 SURGERY — Surgical Case
Anesthesia: Epidural

## 2022-10-29 MED ORDER — DIPHENHYDRAMINE HCL 50 MG/ML IJ SOLN
25.0000 mg | Freq: Once | INTRAMUSCULAR | Status: AC | PRN
  Administered 2022-10-29: 25 mg via INTRAVENOUS
  Filled 2022-10-29: qty 1

## 2022-10-29 MED ORDER — FENTANYL CITRATE (PF) 100 MCG/2ML IJ SOLN: INTRAMUSCULAR | Status: DC | PRN

## 2022-10-29 MED ORDER — ONDANSETRON HCL 4 MG/2ML IJ SOLN
INTRAMUSCULAR | Status: DC | PRN
  Administered 2022-10-29: 4 mg via INTRAVENOUS

## 2022-10-29 MED ORDER — FENTANYL CITRATE (PF) 100 MCG/2ML IJ SOLN: 25.0000 ug | INTRAMUSCULAR | Status: DC | PRN

## 2022-10-29 MED ORDER — LACTATED RINGERS IV SOLN: INTRAVENOUS | Status: DC

## 2022-10-29 MED ORDER — DIPHENHYDRAMINE HCL 25 MG PO CAPS: 25.0000 mg | ORAL_CAPSULE | Freq: Four times a day (QID) | ORAL | Status: DC | PRN

## 2022-10-29 MED ORDER — CEFAZOLIN SODIUM-DEXTROSE 2-4 GM/100ML-% IV SOLN
INTRAVENOUS | Status: AC
  Filled 2022-10-29: qty 100

## 2022-10-29 MED ORDER — OXYTOCIN-SODIUM CHLORIDE 30-0.9 UT/500ML-% IV SOLN: 2.5000 [IU]/h | INTRAVENOUS | Status: AC

## 2022-10-29 MED ORDER — FENTANYL CITRATE (PF) 100 MCG/2ML IJ SOLN
INTRAMUSCULAR | Status: AC
  Filled 2022-10-29: qty 2

## 2022-10-29 MED ORDER — SIMETHICONE 80 MG PO CHEW
80.0000 mg | CHEWABLE_TABLET | ORAL | Status: DC | PRN
  Administered 2022-10-31: 80 mg via ORAL

## 2022-10-29 MED ORDER — WITCH HAZEL-GLYCERIN EX PADS: 1.0000 | MEDICATED_PAD | CUTANEOUS | Status: DC | PRN

## 2022-10-29 MED ORDER — MORPHINE SULFATE (PF) 0.5 MG/ML IJ SOLN
INTRAMUSCULAR | Status: AC
  Filled 2022-10-29: qty 10

## 2022-10-29 MED ORDER — TRANEXAMIC ACID-NACL 1000-0.7 MG/100ML-% IV SOLN
INTRAVENOUS | Status: DC | PRN
  Administered 2022-10-29: 1000 mg via INTRAVENOUS

## 2022-10-29 MED ORDER — METHYLERGONOVINE MALEATE 0.2 MG/ML IJ SOLN
INTRAMUSCULAR | Status: AC
  Filled 2022-10-29: qty 1

## 2022-10-29 MED ORDER — MAGNESIUM HYDROXIDE 400 MG/5ML PO SUSP: 30.0000 mL | ORAL | Status: DC | PRN

## 2022-10-29 MED ORDER — OXYCODONE HCL 5 MG PO TABS: 5.0000 mg | ORAL_TABLET | Freq: Once | ORAL | Status: DC | PRN

## 2022-10-29 MED ORDER — KETOROLAC TROMETHAMINE 30 MG/ML IJ SOLN
30.0000 mg | Freq: Four times a day (QID) | INTRAMUSCULAR | Status: DC
  Administered 2022-10-29: 30 mg via INTRAVENOUS
  Filled 2022-10-29: qty 1

## 2022-10-29 MED ORDER — PRENATAL MULTIVITAMIN CH
1.0000 | ORAL_TABLET | Freq: Every day | ORAL | Status: DC
  Administered 2022-10-30 – 2022-10-31 (×2): 1 via ORAL
  Filled 2022-10-29 (×2): qty 1

## 2022-10-29 MED ORDER — LIDOCAINE-EPINEPHRINE (PF) 2 %-1:200000 IJ SOLN
INTRAMUSCULAR | Status: DC | PRN
  Administered 2022-10-29: 10 mL via EPIDURAL
  Administered 2022-10-29: 3 mL via EPIDURAL
  Administered 2022-10-29 (×3): 5 mL via EPIDURAL

## 2022-10-29 MED ORDER — CEFAZOLIN SODIUM-DEXTROSE 2-3 GM-%(50ML) IV SOLR
INTRAVENOUS | Status: DC | PRN
  Administered 2022-10-29: 2 g via INTRAVENOUS

## 2022-10-29 MED ORDER — BUPIVACAINE HCL (PF) 0.25 % IJ SOLN
INTRAMUSCULAR | Status: DC | PRN
  Administered 2022-10-29: 1.2 mL via INTRATHECAL

## 2022-10-29 MED ORDER — SENNOSIDES-DOCUSATE SODIUM 8.6-50 MG PO TABS
2.0000 | ORAL_TABLET | Freq: Every day | ORAL | Status: DC
  Administered 2022-10-30 – 2022-10-31 (×2): 2 via ORAL
  Filled 2022-10-29 (×2): qty 2

## 2022-10-29 MED ORDER — OXYCODONE HCL 5 MG PO TABS
5.0000 mg | ORAL_TABLET | ORAL | Status: DC | PRN
  Administered 2022-10-30: 5 mg via ORAL
  Administered 2022-10-30 – 2022-10-31 (×3): 10 mg via ORAL
  Administered 2022-10-31: 5 mg via ORAL
  Filled 2022-10-29 (×2): qty 2
  Filled 2022-10-29: qty 1
  Filled 2022-10-29 (×2): qty 2

## 2022-10-29 MED ORDER — MENTHOL 3 MG MT LOZG: 1.0000 | LOZENGE | OROMUCOSAL | Status: DC | PRN

## 2022-10-29 MED ORDER — METHYLERGONOVINE MALEATE 0.2 MG/ML IJ SOLN
INTRAMUSCULAR | Status: DC | PRN
  Administered 2022-10-29: .2 mg via INTRAMUSCULAR

## 2022-10-29 MED ORDER — SODIUM CHLORIDE 0.9 % IR SOLN
Status: DC | PRN
  Administered 2022-10-29 (×2): 1

## 2022-10-29 MED ORDER — DEXAMETHASONE SODIUM PHOSPHATE 10 MG/ML IJ SOLN
INTRAMUSCULAR | Status: DC | PRN
  Administered 2022-10-29: 5 mg via INTRAVENOUS

## 2022-10-29 MED ORDER — ZOLPIDEM TARTRATE 5 MG PO TABS: 5.0000 mg | ORAL_TABLET | Freq: Every evening | ORAL | Status: DC | PRN

## 2022-10-29 MED ORDER — SIMETHICONE 80 MG PO CHEW
80.0000 mg | CHEWABLE_TABLET | Freq: Three times a day (TID) | ORAL | Status: DC
  Administered 2022-10-30 – 2022-10-31 (×4): 80 mg via ORAL
  Filled 2022-10-29 (×5): qty 1

## 2022-10-29 MED ORDER — SODIUM CHLORIDE 0.9 % IV SOLN
INTRAVENOUS | Status: DC | PRN
  Administered 2022-10-29: 500 mg via INTRAVENOUS

## 2022-10-29 MED ORDER — PROMETHAZINE HCL 25 MG/ML IJ SOLN: 6.2500 mg | INTRAMUSCULAR | Status: DC | PRN

## 2022-10-29 MED ORDER — IBUPROFEN 600 MG PO TABS: 600.0000 mg | ORAL_TABLET | Freq: Four times a day (QID) | ORAL | Status: DC

## 2022-10-29 MED ORDER — ONDANSETRON HCL 4 MG/2ML IJ SOLN
INTRAMUSCULAR | Status: AC
  Filled 2022-10-29: qty 2

## 2022-10-29 MED ORDER — DIBUCAINE (PERIANAL) 1 % EX OINT: 1.0000 | TOPICAL_OINTMENT | CUTANEOUS | Status: DC | PRN

## 2022-10-29 MED ORDER — OXYTOCIN-SODIUM CHLORIDE 30-0.9 UT/500ML-% IV SOLN
INTRAVENOUS | Status: DC | PRN
  Administered 2022-10-29: 300 mL via INTRAVENOUS

## 2022-10-29 MED ORDER — COCONUT OIL OIL: 1.0000 | TOPICAL_OIL | Status: DC | PRN

## 2022-10-29 MED ORDER — ENOXAPARIN SODIUM 60 MG/0.6ML IJ SOSY
60.0000 mg | PREFILLED_SYRINGE | INTRAMUSCULAR | Status: DC
  Administered 2022-10-30 – 2022-10-31 (×2): 60 mg via SUBCUTANEOUS
  Filled 2022-10-29 (×2): qty 0.6

## 2022-10-29 MED ORDER — KETOROLAC TROMETHAMINE 30 MG/ML IJ SOLN: 30.0000 mg | Freq: Once | INTRAMUSCULAR | Status: DC | PRN

## 2022-10-29 MED ORDER — MORPHINE SULFATE (PF) 0.5 MG/ML IJ SOLN
INTRAMUSCULAR | Status: DC | PRN
  Administered 2022-10-29: 3 mg via EPIDURAL

## 2022-10-29 MED ORDER — GABAPENTIN 100 MG PO CAPS
200.0000 mg | ORAL_CAPSULE | Freq: Every day | ORAL | Status: DC
  Administered 2022-10-29 – 2022-10-30 (×2): 200 mg via ORAL
  Filled 2022-10-29 (×2): qty 2

## 2022-10-29 MED ORDER — ACETAMINOPHEN 10 MG/ML IV SOLN
INTRAVENOUS | Status: DC | PRN
  Administered 2022-10-29: 1000 mg via INTRAVENOUS

## 2022-10-29 MED ORDER — OXYCODONE HCL 5 MG/5ML PO SOLN: 5.0000 mg | Freq: Once | ORAL | Status: DC | PRN

## 2022-10-29 MED ORDER — ACETAMINOPHEN 500 MG PO TABS
1000.0000 mg | ORAL_TABLET | Freq: Four times a day (QID) | ORAL | Status: DC
  Filled 2022-10-29 (×3): qty 2

## 2022-10-29 MED ORDER — DEXMEDETOMIDINE HCL IN NACL 80 MCG/20ML IV SOLN
INTRAVENOUS | Status: DC | PRN
  Administered 2022-10-29: 12 ug via INTRAVENOUS

## 2022-10-29 SURGICAL SUPPLY — 34 items
ADH SKN CLS APL DERMABOND .7 (GAUZE/BANDAGES/DRESSINGS) ×1
APL PRP STRL LF DISP 70% ISPRP (MISCELLANEOUS) ×2
CHLORAPREP W/TINT 26 (MISCELLANEOUS) ×2 IMPLANT
CLAMP UMBILICAL CORD (MISCELLANEOUS) ×1 IMPLANT
CLOTH BEACON ORANGE TIMEOUT ST (SAFETY) ×1 IMPLANT
DERMABOND ADVANCED .7 DNX12 (GAUZE/BANDAGES/DRESSINGS) ×1 IMPLANT
DRESSING PREVENA PLUS CUSTOM (GAUZE/BANDAGES/DRESSINGS) IMPLANT
DRSG OPSITE POSTOP 4X10 (GAUZE/BANDAGES/DRESSINGS) ×1 IMPLANT
DRSG PREVENA PLUS CUSTOM (GAUZE/BANDAGES/DRESSINGS) ×1
ELECT REM PT RETURN 9FT ADLT (ELECTROSURGICAL) ×1
ELECTRODE REM PT RTRN 9FT ADLT (ELECTROSURGICAL) ×1 IMPLANT
EXTRACTOR VACUUM KIWI (MISCELLANEOUS) ×1 IMPLANT
GAUZE SPONGE 4X4 12PLY STRL LF (GAUZE/BANDAGES/DRESSINGS) IMPLANT
GLOVE BIOGEL PI IND STRL 7.0 (GLOVE) ×3 IMPLANT
GLOVE ECLIPSE 6.5 STRL STRAW (GLOVE) ×1 IMPLANT
GOWN STRL REUS W/ TWL LRG LVL3 (GOWN DISPOSABLE) ×2 IMPLANT
GOWN STRL REUS W/TWL LRG LVL3 (GOWN DISPOSABLE) ×2
MAT PREVALON FULL STRYKER (MISCELLANEOUS) IMPLANT
NS IRRIG 1000ML POUR BTL (IV SOLUTION) ×1 IMPLANT
PAD OB MATERNITY 4.3X12.25 (PERSONAL CARE ITEMS) ×1 IMPLANT
PAD PREP 24X48 CUFFED NSTRL (MISCELLANEOUS) ×1 IMPLANT
RETRACTOR TRAXI PANNICULUS (MISCELLANEOUS) IMPLANT
RETRACTOR WND ALEXIS 25 LRG (MISCELLANEOUS) IMPLANT
RTRCTR WOUND ALEXIS 25CM LRG (MISCELLANEOUS)
SUT MNCRL AB 0 CT1 27 (SUTURE) ×1 IMPLANT
SUT PLAIN 2 0 XLH (SUTURE) ×1 IMPLANT
SUT VIC AB 0 CT1 36 (SUTURE) ×1 IMPLANT
SUT VIC AB 2-0 CT1 27 (SUTURE) ×1
SUT VIC AB 2-0 CT1 TAPERPNT 27 (SUTURE) ×1 IMPLANT
SUT VIC AB 4-0 KS 27 (SUTURE) ×1 IMPLANT
TOWEL OR 17X24 6PK STRL BLUE (TOWEL DISPOSABLE) ×3 IMPLANT
TRAY FOLEY W/BAG SLVR 16FR (SET/KITS/TRAYS/PACK) ×1
TRAY FOLEY W/BAG SLVR 16FR ST (SET/KITS/TRAYS/PACK) ×1 IMPLANT
WATER STERILE IRR 1000ML POUR (IV SOLUTION) ×1 IMPLANT

## 2022-10-29 NOTE — Anesthesia Procedure Notes (Signed)
Epidural Patient location during procedure: OB Start time: 10/29/2022 4:07 AM End time: 10/29/2022 4:17 AM  Staffing Anesthesiologist: Lannie Fields, DO Performed: anesthesiologist   Preanesthetic Checklist Completed: patient identified, IV checked, risks and benefits discussed, monitors and equipment checked, pre-op evaluation and timeout performed  Epidural Patient position: sitting Prep: DuraPrep and site prepped and draped Patient monitoring: continuous pulse ox, blood pressure, heart rate and cardiac monitor Approach: midline Location: L3-L4 Injection technique: LOR air  Needle:  Needle type: Tuohy  Needle gauge: 17 G Needle length: 9 cm Needle insertion depth: 9 cm Catheter type: closed end flexible Catheter size: 19 Gauge Catheter at skin depth: 14 cm Test dose: negative  Assessment Sensory level: T8 Events: blood not aspirated, no cerebrospinal fluid, injection not painful, no injection resistance, no paresthesia and negative IV test  Additional Notes Patient identified. Risks/Benefits/Options discussed with patient including but not limited to bleeding, infection, nerve damage, paralysis, failed block, incomplete pain control, headache, blood pressure changes, nausea, vomiting, reactions to medication both or allergic, itching and postpartum back pain. Confirmed with bedside nurse the patient's most recent platelet count. Confirmed with patient that they are not currently taking any anticoagulation, have any bleeding history or any family history of bleeding disorders. Patient expressed understanding and wished to proceed. All questions were answered. Sterile technique was used throughout the entire procedure. Please see nursing notes for vital signs. Test dose was given through epidural catheter and negative prior to continuing to dose epidural or start infusion. Warning signs of high block given to the patient including shortness of breath, tingling/numbness in  hands, complete motor block, or any concerning symptoms with instructions to call for help. Patient was given instructions on fall risk and not to get out of bed. All questions and concerns addressed with instructions to call with any issues or inadequate analgesia.    CSE performed with 24G pencan through tuohy, slow trickle of blood-tinged CSF, medication injected without issue. Difficult epidural placement due to habitus, lack of landmarks. 9cm tuohy fully hubbed for LOR. Reason for block:procedure for pain

## 2022-10-29 NOTE — Progress Notes (Signed)
Patient ID: Gloria Cox, female   DOB: December 21, 1995, 27 y.o.   MRN: [id number]  Pt stopped ~0400 due to epidural replacement/pain issues; having some vag pain/discomfort now but is overall much better; Pit restarted @ 0415  BPs 118/72, 111/56 FHR 125-135, +accels, no decels Cx 4-5, swollen ant lip (per RN exam) Ctx q 2-4 mins with Pit at 80mu/min and inadequate MVUs at this point   P/C: neg  IUP@39 .1wks IOL process TOLAC GDMA2 gHTN  -Continue to uptitrate Pit to achieve adequate MVUs  -Benadryl/fluids for swollen cx -Discussion of pt not wanting to remain at 4-5cm for an extended time in the presence of adequate MVUs; will convey to day team and have further discussion as the day goes on  [name] [facility] 10/29/2022 8:41 AM

## 2022-10-29 NOTE — Transfer of Care (Signed)
Immediate Anesthesia Transfer of Care Note  Patient: Gloria Cox  Procedure(s) Performed: CESAREAN SECTION  Patient Location: PACU  Anesthesia Type:Spinal  Level of Consciousness: awake  Airway & Oxygen Therapy: Patient Spontanous Breathing  Post-op Assessment: Report given to RN  Post vital signs: Reviewed and stable  Last Vitals:  Vitals Value Taken Time  BP 107/96 10/29/22 1540  Temp    Pulse 77 10/29/22 1544  Resp 21 10/29/22 1544  SpO2 96 % 10/29/22 1544  Vitals shown include unfiled device data.  Last Pain:  Vitals:   10/29/22 1326  TempSrc: Axillary  PainSc:       Patients Stated Pain Goal: 3 (10/28/22 2000)  Complications: No notable events documented.

## 2022-10-29 NOTE — Op Note (Signed)
[name] PROCEDURE DATE: 10/29/2022  PREOPERATIVE DIAGNOSES: Intrauterine pregnancy at [redacted]w[redacted]d weeks gestation;  suspected uterine rupture, elective  POSTOPERATIVE DIAGNOSES: elective rCS, viable infant delivered  PROCEDURE: Repeat Low Transverse Cesarean Section  SURGEON:  Dr. Karyl Kinnier  ASSISTANT:  Myrtie Hawk, DO An experienced assistant was required given the standard of surgical care given the complexity of the case.  This assistant was needed for exposure, dissection, suctioning, retraction, instrument exchange, assisting with delivery with administration of fundal pressure, and for overall help during the procedure.  ANESTHESIOLOGY TEAM: Anesthesiologist: Linton Rump, MD; Lannie Fields, DO; Kaylyn Layer, MD CRNA: Algis Greenhouse, CRNA  INDICATIONS: Gloria Cox is a 27 y.o. [phone] at [redacted]w[redacted]d here for cesarean section secondary to the indications listed under preoperative diagnoses; please see preoperative note for further details.  The risks of surgery were discussed with the patient including but were not limited to: bleeding which may require transfusion or reoperation; infection which may require antibiotics; injury to bowel, bladder, ureters or other surrounding organs; injury to the fetus; need for additional procedures including hysterectomy in the event of a life-threatening hemorrhage; formation of adhesions; placental abnormalities wth subsequent pregnancies; incisional problems; thromboembolic phenomenon and other postoperative/anesthesia complications.  The patient concurred with the proposed plan, giving informed written consent for the procedure.    FINDINGS:  Viable female infant in cephalic presentation.  Apgars 6 and 7.  Amniotic fluid: meconium.  Intact placenta, three vessel cord.  Normal uterus, fallopian tubes and ovaries bilaterally.  ANESTHESIA: epidural INTRAVENOUS FLUIDS: 1300 ml   ESTIMATED BLOOD LOSS: 729 ml URINE  OUTPUT:  200 ml SPECIMENS: Placenta sent to pathology . COMPLICATIONS: None immediate  PROCEDURE IN DETAIL:  The patient preoperatively received intravenous antibiotics and had sequential compression devices applied to her lower extremities.  She was then taken to the operating room where the epidural was dosed up to a surgical level and found to be adequate. She was then placed in a dorsal supine position with a leftward tilt, and prepped and draped in a sterile manner.  A foley catheter was  was already in place.  After an adequate timeout was performed, a Pfannenstiel skin incision was made with scalpel and carried through to the underlying layer of fascia. The fascia was incised in the midline, and this incision was extended bluntly. The rectus muscles were separated in the midline and the peritoneum was entered bluntly.   The Alexis self-retaining retractor was introduced into the abdominal cavity.  Attention was turned to the lower uterine segment where a low transverse hysterotomy was made with a scalpel and extended bluntly in caudad and cephalad directions.  The infant was successfully delivered, the cord was clamped and cut after one minute, and the infant was handed over to the awaiting neonatology team. Uterine massage was then administered, and the placenta delivered intact with a three-vessel cord. The uterus was then exteriorized and cleared of clots and debris.  Noted R extension of hysterotomy to edge of broad ligament. The hysterotomy was closed with 0-Monocryl in a running fashion. Reinforced R extension with 0- Monocryl in running fashion and tied medially with first monocryl suture.  The pelvis was cleared of all clot and debris. Hemostasis was confirmed on all surfaces including retroperitoneally.  The uterus was returned to the abdomen and the incision was once again inspected and found to be hemostatic. The retractor was removed.  The peritoneum was closed with a 2-0 Vicryl running  stitch.  The fascia was then closed using 0 Vicryl in a running fashion.  The subcutaneous layer was irrigated, any areas of bleeding were cauterized with the bovie,  was reapproximated with 2-0 plain gut in a running fashion, was found to be hemostatic.. The skin was closed with a 4-0 Vicryl subcuticular stitch. The patient tolerated the procedure well. Sponge, instrument and needle counts were correct x 3.  She was taken to the recovery room in stable condition.   Myrtie Hawk, DO FMOB Fellow, Faculty practice [facility], Center for [facility] 10/29/22  3:25 PM

## 2022-10-29 NOTE — Progress Notes (Signed)
Labor Progress Note  Gloria Cox is a 27 y.o. G2P1001 at [redacted]w[redacted]d presented for IOL [name]   S: Pt feeling intense pain despite repeat bolus epidural.  Sudden onset of abdominal pain that doesn't go away after cessation of CTX.    O:  BP 114/68   Pulse 78   Temp 97.9 F (36.6 C) (Axillary)   Resp 16   Ht 5\' 3"  (1.6 m)   Wt 116 kg   LMP 01/28/2022 (Approximate)   SpO2 99%   BMI 45.30 kg/m  EFM:135 bpm/Moderate variability/ 15x15 accels/ None decels CAT: 1 Toco: regular, every 1-2 minutes   CVE: Dilation: 6.5 Effacement (%): 70 (swollen) Cervical Position: Posterior Station: -1 Presentation: Vertex Exam by:: Dr. Camelia Phenes   A&P: 27 y.o. G2P1001 [redacted]w[redacted]d  here for IOL as above  #Labor: discussed at length for option for redosing epidural, break, and csection.  Patient elects for csection given worsening pain, exhaustion and lack of relief with bolus attempts thus far.   Gloria Cox has agreed to proceed with cesarean section due to suspected uterine rupture. The risks of surgery were discussed with the patient including but were not limited to: bleeding which may require transfusion or reoperation; infection which may require antibiotics; injury to bowel, bladder, ureters or other surrounding organs; injury to the fetus; need for additional procedures including hysterectomy in the event of a life-threatening hemorrhage; formation of adhesions; placental abnormalities wth subsequent pregnancies; incisional problems; thromboembolic phenomenon and other postoperative/anesthesia complications. The patient concurred with the proposed plan, giving informed written consent for the procedure. Patient has been NPO for 12 hours she will remain NPO for procedure. Anesthesia and OR aware. Preoperative prophylactic antibiotics and SCDs ordered on call to the OR. To OR when ready.    #Pain: Family/Friend support and Epidural #FWB: CAT 1 #GBS negative # [name]: CBG every 2 hours, last CBG  106.  CTM.   Hessie Dibble, MD FMOB Fellow, Faculty practice [facility], Center for [facility] 10/29/22  1:42 PM

## 2022-10-29 NOTE — Discharge Summary (Signed)
Postpartum Discharge Summary     Patient Name: Gloria Cox DOB: [birth year] MRN: [id number]  Date of admission: 10/28/2022 Delivery date:10/29/2022 Delivering provider: Federico Flake Date of discharge: 10/31/2022  Admitting diagnosis: Gestational diabetes mellitus (GDM) in third trimester controlled on oral hypoglycemic drug [O24.415] Intrauterine pregnancy: [redacted]w[redacted]d     Secondary diagnosis:  Principal Problem:   Status post repeat low transverse cesarean section Active Problems:   HSV-1 (herpes simplex virus 1) infection   Obesity in pregnancy   Tobacco use   Gestational diabetes mellitus (GDM) in third trimester controlled on oral hypoglycemic drug  Additional problems: HSV-1, obesity in pregnancy, tobacco use, GDM    Discharge diagnosis: Term Pregnancy Delivered and Gestational Hypertension                                              Post partum procedures: n/a  Augmentation: AROM, Pitocin, and IP Foley Complications: None  Hospital course: Induction of Labor With Cesarean Section   27 y.o. yo G2P2002 at [redacted]w[redacted]d was admitted to the hospital 10/28/2022 for induction of labor. Patient had a labor course significant for augmentation with eventual sudden onset of abdominal pain concerning for ?uterine rupture requiring elective rLTCS. The patient went for cesarean section due to Elective Repeat and concern for ?uterine rupture . Delivery details are as follows: Membrane Rupture Time/Date: 2:52 PM,10/28/2022  Delivery Method:C-Section, Low Transverse Operative Delivery: n/a  Details of operation can be found in separate operative Note.  Patient had a uncomplicated postpartum course. She is ambulating, tolerating a regular diet, passing flatus, and urinating well.  Patient is discharged home in stable condition on 10/31/22.      Newborn Data: Birth date:10/29/2022 Birth time:2:37 PM Gender:Female Living status:Living Apgars:6 ,7  Weight:3200 g                                Magnesium Sulfate received: No BMZ received: No Rhophylac:N/A MMR:N/A T-DaP:Given prenatally Flu: Yes Transfusion:No  Physical exam  Vitals:   10/30/22 0830 10/30/22 1400 10/30/22 2017 10/31/22 0510  BP: 119/75 123/72 129/60 118/67  Pulse: 82 69 76 71  Resp: 16 18 18 16   Temp: 97.7 F (36.5 C) 97.6 F (36.4 C) 98.6 F (37 C) 98.2 F (36.8 C)  TempSrc: Oral Oral Oral Oral  SpO2: 100%  98% 100%  Weight:      Height:       General: alert, cooperative, and no distress Lochia: appropriate Uterine Fundus: firm Incision: Healing well with no significant drainage, No significant erythema, Dressing is clean, dry, and intact DVT Evaluation: No evidence of DVT seen on physical exam. Labs: Lab Results  Component Value Date   WBC 12.4 (H) 10/30/2022   HGB 9.9 (L) 10/30/2022   HCT 30.9 (L) 10/30/2022   MCV 87.0 10/30/2022   PLT 152 10/30/2022      Latest Ref Rng & Units 10/28/2022    6:16 AM  CMP  Glucose 70 - 99 mg/dL 99   BUN 6 - 20 mg/dL 10   Creatinine 4.09 - 1.00 mg/dL 8.11   Sodium 914 - 782 mmol/L 133   Potassium 3.5 - 5.1 mmol/L 3.3   Chloride 98 - 111 mmol/L 105   CO2 22 - 32 mmol/L 16   Calcium 8.9 - 10.3  mg/dL 8.7   Total Protein 6.5 - 8.1 g/dL 6.1   Total Bilirubin 0.3 - 1.2 mg/dL 0.4   Alkaline Phos 38 - 126 U/L 172   AST 15 - 41 U/L 20   ALT 0 - 44 U/L 46    Edinburgh Score:    10/30/2022    2:56 PM  Edinburgh Postnatal Depression Scale Screening Tool  I have been able to laugh and see the funny side of things. 0  I have looked forward with enjoyment to things. 0  I have blamed myself unnecessarily when things went wrong. 1  I have been anxious or worried for no good reason. 1  I have felt scared or panicky for no good reason. 0  Things have been getting on top of me. 1  I have been so unhappy that I have had difficulty sleeping. 2  I have felt sad or miserable. 1  I have been so unhappy that I have been crying. 1  The thought of harming myself  has occurred to me. 0  Edinburgh Postnatal Depression Scale Total 7     After visit meds:  Allergies as of 10/31/2022   No Known Allergies      Medication List     STOP taking these medications    Excedrin Migraine 250-250-65 MG tablet Generic drug: aspirin-acetaminophen-caffeine   metroNIDAZOLE 500 MG tablet Commonly known as: FLAGYL       TAKE these medications    Accu-Chek Guide test strip Generic drug: glucose blood Use as instructed   Accu-Chek Guide w/Device Kit 1 kit by Does not apply route Cox.   Accu-Chek Softclix Lancets lancets 100 each by Other route 4 (four) times Cox.   Acetaminophen Extra Strength 500 MG Tabs Take 2 tablets (1,000 mg total) by mouth every 6 (six) hours.   albuterol (2.5 MG/3ML) 0.083% nebulizer solution Commonly known as: PROVENTIL Take 3 mLs (2.5 mg total) by nebulization every 6 (six) hours as needed for wheezing or shortness of breath.   albuterol 108 (90 Base) MCG/ACT inhaler Commonly known as: Ventolin HFA INHALE 2 PUFFS EVERY 4 HOURS BY INHALATION ROUTE.   budesonide-formoterol 160-4.5 MCG/ACT inhaler Commonly known as: SYMBICORT Inhale 2 puffs into the lungs 2 (two) times Cox.   cetirizine 10 MG tablet Commonly known as: ZyrTEC Allergy Take 1 tablet (10 mg total) by mouth Cox.   ciclopirox 0.77 % cream Commonly known as: Loprox Apply topically 2 (two) times Cox.   Gojji Weight Scale Misc 1 Device by Does not apply route every 30 (thirty) days.   ibuprofen 600 MG tablet Commonly known as: ADVIL Take 1 tablet (600 mg total) by mouth every 6 (six) hours.   metFORMIN 500 MG 24 hr tablet Commonly known as: GLUCOPHAGE-XR Take 2 tablets (1,000 mg total) by mouth 2 (two) times Cox with a meal.   Mucinex Sinus-Max Cong & Pain 10-5-325 MG Caps Generic drug: DM-Phenylephrine-Acetaminophen Take 1 capsule by mouth every 6 (six) hours as needed (for congestion).   oxyCODONE 5 MG immediate release  tablet Commonly known as: Oxy IR/ROXICODONE Take 1-2 tablets (5-10 mg total) by mouth every 4 (four) hours as needed for moderate pain.   pantoprazole 40 MG tablet Commonly known as: Protonix Take 1 tablet (40 mg total) by mouth 2 (two) times Cox before a meal.   Slynd 4 MG Tabs Generic drug: Drospirenone Take 1 tablet (4 mg total) by mouth Cox.   terconazole 0.8 % vaginal cream Commonly known as: TERAZOL  3 Place 1 applicator vaginally at bedtime.   terconazole 0.4 % vaginal cream Commonly known as: TERAZOL 7 Place 1 applicator vaginally at bedtime.   Vitafol Ultra 29-0.6-0.4-200 MG Caps Take 1 capsule by mouth Cox before breakfast.         Discharge home in stable condition Infant Feeding: Breast Infant Disposition:home with mother Discharge instruction: per After Visit Summary and Postpartum booklet. Activity: Advance as tolerated. Pelvic rest for 6 weeks.  Diet: routine diet Future Appointments: Future Appointments  Date Time Provider Department Center  11/06/2022 10:20 AM [facility] NURSE [facility] None  12/14/2022  3:10 PM Marny Lowenstein, PA-C [facility] None   Follow up Visit: Sent message on 8/1 to [facility].  Please schedule this patient for a In person postpartum visit in 6 weeks with the following provider: Any provider. Additional Postpartum F/U:Postpartum Depression checkup, Incision check 1 week, and BP check 1 week  High risk pregnancy complicated by: HTN, GDM Delivery mode:  C-Section, Low TransverserLTCS Anticipated Birth Control:  Nexplanon   10/31/2022 Hessie Dibble, MD

## 2022-10-29 NOTE — Progress Notes (Signed)
Patient ID: Gloria Cox, female   DOB: 07/24/1995, 26 y.o.   MRN: [id number]  Comfortable w epidural; no s/s pre-e  BPs 144/93, 127/80, 140/77 FHR 135-145, +accels, occ mi variables Ctx irreg 2-5 mins with Pit @ 9mu/min Cx 3+/50/vtx -3  IUP@39 .0wks IOL process TOLAC GDMA2 New onset gHTN  CMET with nl LFTs/creatinine earlier Will get P/C ratio and manage as gHTN as BPs aren't severe range Plan to check after MVUs adequate x 2h Hopeful for VBAC  [name] 10/29/2022 12:03 AM

## 2022-10-29 NOTE — Progress Notes (Signed)
Pt very uncomfortable with contractions despite epidural. RN reported pt able to move and turn herself.  FHT reviewed and 120, mod variability, no decels.  CE by RN is 5 cm (unchanged from their prior exam).  Plan is to turn off pitocin until able to get more comfortable. FHT reassuring so low suspicion for uterine rupture at this time.   Milas Hock, MD Attending Obstetrician & Gynecologist, [facility] for [facility], [facility]

## 2022-10-29 NOTE — Anesthesia Postprocedure Evaluation (Signed)
Anesthesia Post Note  Patient: Gloria Cox  Procedure(s) Performed: CESAREAN SECTION     Patient location during evaluation: PACU Anesthesia Type: Epidural Level of consciousness: awake Pain management: pain level controlled Vital Signs Assessment: post-procedure vital signs reviewed and stable Respiratory status: spontaneous breathing, nonlabored ventilation and respiratory function stable Cardiovascular status: stable Postop Assessment: no headache, no backache and epidural receding Anesthetic complications: no   No notable events documented.  Last Vitals:  Vitals:   10/29/22 1657 10/29/22 1740  BP: 112/67   Pulse: 76   Resp:    Temp: 37.4 C 37.5 C  SpO2: 96%     Last Pain:  Vitals:   10/29/22 1740  TempSrc: Axillary  PainSc: 0-No pain   Pain Goal: Patients Stated Pain Goal: 3 (10/28/22 2000)              Epidural/Spinal Function Cutaneous sensation: Normal sensation (10/29/22 1657)  Linton Rump

## 2022-10-29 NOTE — Progress Notes (Signed)
Labor Progress Note  Gloria Cox is a 27 y.o. G2P1001 at [redacted]w[redacted]d presented for IOL [name]  S: Pt feeling pressure  O:  BP 108/60   Pulse 72   Temp 98.7 F (37.1 C) (Oral)   Resp 16   Ht 5\' 3"  (1.6 m)   Wt 116 kg   LMP 01/28/2022 (Approximate)   SpO2 98%   BMI 45.30 kg/m  EFM: 135 bpm/Moderate variability/ 15x15 accels/ None decels CAT: 1 Toco: regular, every 2-4 minutes   CVE: Dilation: 4.5 Effacement (%): 70 (swollen) Cervical Position: Posterior Station: -1 Presentation: Vertex Exam by:: Ileana Ladd, RN   A&P: 27 y.o. G2P1001 [redacted]w[redacted]d  here for IOL as above  #Labor: 6cm dilated. Progressing, continue pitocin #Pain: Family/Friend support and Epidural #FWB: CAT 1 #GBS negative # [name]: CBG every 2 hours, last CBG 106.  CTM.  Myrtie Hawk, DO FMOB Fellow, Faculty practice [facility], Center for [facility] 10/29/22  10:26 AM

## 2022-10-30 ENCOUNTER — Encounter (HOSPITAL_COMMUNITY): Payer: Self-pay | Admitting: Family Medicine

## 2022-10-30 LAB — GLUCOSE, CAPILLARY: Glucose-Capillary: 121 mg/dL — ABNORMAL HIGH (ref 70–99)

## 2022-10-30 MED ORDER — KETOROLAC TROMETHAMINE 60 MG/2ML IM SOLN
30.0000 mg | Freq: Once | INTRAMUSCULAR | Status: AC
  Administered 2022-10-30: 30 mg via INTRAMUSCULAR
  Filled 2022-10-30: qty 2

## 2022-10-30 MED ORDER — IBUPROFEN 600 MG PO TABS
600.0000 mg | ORAL_TABLET | Freq: Four times a day (QID) | ORAL | Status: DC
  Administered 2022-10-30 – 2022-10-31 (×6): 600 mg via ORAL
  Filled 2022-10-30 (×6): qty 1

## 2022-10-30 NOTE — Progress Notes (Signed)
POSTPARTUM PROGRESS NOTE  Subjective: Gloria Cox is a 27 y.o. G2P2002 s/p /LTCS at [redacted]w[redacted]d.  She reports she is doing well, but tired. No acute events overnight. She denies any problems with ambulating, voiding or po intake. Denies nausea or vomiting. She has passed flatus. Pain is well controlled.  Lochia is present in mild to moderate amount.  Objective: Blood pressure 131/71, pulse 68, temperature 98.6 F (37 C), temperature source Oral, resp. rate 16, height 5\' 3"  (1.6 m), weight 116 kg, last menstrual period 01/28/2022, SpO2 98%, currently breastfeeding.  Physical Exam:  General: alert, cooperative and no distress Chest: no respiratory distress Abdomen: soft, non-tender; no erythema or bleeding at incision site  Uterine Fundus: firm and at level of umbilicus Extremities: No calf swelling or tenderness  Minimal edema  Recent Labs    10/29/22 1612 10/30/22 0627  HGB 11.1* 9.9*  HCT 34.5* 30.9*    Assessment/Plan: Gloria Cox is a 27 y.o. [id number] s/p LTCS at [redacted]w[redacted]d for IOL [name].  Routine Postpartum Care: Doing well, pain well-controlled.  -- Continue routine care, lactation support  -- Contraception: Initially OCP vs. Nexplanon. -- Feeding: Breastfeeding -- HBG: Consider starting oral iron.  Dispo: Doing well postoperatively. Continue current care. Anticipate d/c 11/01/22.  , [place] E, Medical Student Faculty Practice, Center for [employer] 10/30/2022 7:15 AM

## 2022-10-30 NOTE — Progress Notes (Signed)
POSTPARTUM PROGRESS NOTE  Post Partum Day 1 Subjective:  Gloria Cox is a 27 y.o. W1X9147 [redacted]w[redacted]d s/p rltcs.  No acute events overnight.  Pt denies problems with ambulating, voiding or po intake.  She denies nausea or vomiting.  Pain is well controlled.  She has had flatus. She has not had bowel movement.  Lochia Small.   Objective: Blood pressure 119/75, pulse 82, temperature 97.7 F (36.5 C), temperature source Oral, resp. rate 16, height 5\' 3"  (1.6 m), weight 116 kg, last menstrual period 01/28/2022, SpO2 100%, unknown if currently breastfeeding.  Physical Exam:  General: alert, cooperative and no distress Lochia:normal flow Chest: CTAB Heart: RRR no m/r/g Abdomen: +BS, soft, nontender,  Uterine Fundus: firm, dressing c/d/i DVT Evaluation: No calf swelling or tenderness Extremities: trace LE edema  Recent Labs    10/29/22 1612 10/30/22 0627  HGB 11.1* 9.9*  HCT 34.5* 30.9*    Assessment/Plan:  ASSESSMENT: Gloria Cox is a 27 y.o. W2N5621 [redacted]w[redacted]d s/p rltcs, doing well. Hgb 9.9. fasting glucose appropriate. Not hypertensive. Planning on POP at discharge, some sort of LARC at postpartum f/u. Plan for d/c tomorrow or next day. Consented for circ.     LOS: 2 days   Silvano Bilis 10/30/2022, 9:54 AM

## 2022-10-30 NOTE — Lactation Note (Signed)
This note was copied from a baby's chart. Lactation Consultation Note  Patient Name: Gloria Cox WGNFA'O Date: 10/30/2022 Age:25 hours Reason for consult: Initial assessment;Term;Infant weight loss;Maternal endocrine disorder;Breastfeeding assistance (4 % weight loss) See doc flow sheets for details of latch. LC reassured mom she is off to a good start BF . LC reviewed the doc flow sheets and WNL for age.  See below for details of DEBP for home   Maternal Data Has patient been taught Hand Expression?: Yes Does the patient have breastfeeding experience prior to this delivery?: Yes How long did the patient breastfeed?: per mom 4 1/2 months and for this pregnancy has been leaking colostrum  Feeding Mother's Current Feeding Choice: Breast Milk  LATCH Score Latch:  (latched with depth , increased with turning baby on his side)  Audible Swallowing:  (multiple swallows)     Comfort (Breast/Nipple):  (per mom comfortable)  Hold (Positioning):  (mom had latched the baby)      Lactation Tools Discussed/Used Tools: Pump;Flanges (mom already received the HP . LC reviewed and checked the flange size) Flange Size: 24 Breast pump type: Manual Pump Education: Setup, frequency, and cleaning;Milk Storage Reason for Pumping: PRN - especially for the left breast due to semi tough areola . LC recommended - a dab of coconut oil and showed mom how to do the reverse pressure and the areola softened  Interventions Interventions: Breast feeding basics reviewed;Breast compression;Adjust position;Hand pump;Education (mom asked about shells , after assessment - LC recommended not using the shells and just due the reverse pressure) Coconut oil  Discharge Pump: Manual;Stork Pump (will send a referral for Stork , otherwise will provide the information for mom to call her Medicaid for a DEBP) WIC Program: No (per mom wil have to resign up due to [facility] lapse)  Consult Status Consult Status:  Follow-up Date: 10/31/22 Follow-up type: In-patient    Matilde Sprang  10/30/2022, 9:27 AM

## 2022-10-31 MED ORDER — IBUPROFEN 600 MG PO TABS: 600.0000 mg | ORAL_TABLET | Freq: Four times a day (QID) | ORAL | 0 refills | Status: DC

## 2022-10-31 MED ORDER — SLYND 4 MG PO TABS: 1.0000 | ORAL_TABLET | Freq: Every day | ORAL | 0 refills | Status: DC

## 2022-10-31 MED ORDER — OXYCODONE HCL 5 MG PO TABS: 5.0000 mg | ORAL_TABLET | ORAL | 0 refills | Status: DC | PRN

## 2022-10-31 NOTE — Lactation Note (Signed)
This note was copied from a baby's chart. Lactation Consultation Note  Patient Name: Gloria Cox GLOVF'I Date: 10/31/2022 Age:25 hours Reason for consult: Follow-up assessment, term, endocrine disorders (Thyroid Disease, GDM)  P2, term, 7% weight loss, experienced breastfeeding mother  Mother states baby is breastfeeding well. She reports he was sleepy for 6 hours following his circumcision and she supplemented with formula.   Mother has been breast and formula feeding. Discussed the process of milk production, supply and demand and the importance of breast stimulation and milk removal. Instructed mother to breastfeed 8-12 times in 24 hours, skin to skin, breast feed before formula feeding. Pump when baby gets a bottle supplement to establish a milk supply. Mother verbalized understanding. Has a manual breast pump at home.    Feeding Mother's Current Feeding Choice: Breast Milk and Formula    LATCH Interventions Interventions: Education;LC Services brochure  Discharge Discharge Education: Engorgement and breast care;Warning signs for feeding baby Pump: Manual (did not qualify for stork pump, information given for other options to contact)  Consult Status Consult Status: Complete Date: 10/31/22    Omar Person 10/31/2022, 4:46 PM

## 2022-10-31 NOTE — Progress Notes (Signed)
POSTPARTUM PROGRESS NOTE  Post Partum Day 1 Subjective:  Gloria Cox is a 27 y.o. [id number] [redacted]w[redacted]d s/p rltcs.  No acute events overnight.  Pt denies problems with ambulating, voiding or po intake.  She denies nausea or vomiting.  Pain is well controlled.  She has had flatus. She has not had bowel movement.  Lochia Small.   Objective: Blood pressure 118/67, pulse 71, temperature 98.2 F (36.8 C), temperature source Oral, resp. rate 16, height 5\' 3"  (1.6 m), weight 116 kg, last menstrual period 01/28/2022, SpO2 100%, unknown if currently breastfeeding.  Physical Exam:  General: alert, cooperative and no distress Lochia:normal flow Chest: CTAB Heart: RRR no m/r/g Abdomen: +BS, soft, nontender,  Uterine Fundus: firm, dressing c/d/i DVT Evaluation: No calf swelling or tenderness Extremities: trace LE edema  Recent Labs    10/29/22 1612 10/30/22 0627  HGB 11.1* 9.9*  HCT 34.5* 30.9*    Assessment/Plan:  ASSESSMENT: Gloria Cox is a 27 y.o. J4N8295 [redacted]w[redacted]d s/p rltcs, doing well. Hgb 9.9. fasting glucose appropriate but consider restarting metformin. Not hypertensive. Planning on POP at discharge, some sort of LARC at postpartum f/u. Plan for d/c today or next day.     LOS: 3 days    Autry-Lott 10/31/2022, 6:46 AM

## 2022-11-02 ENCOUNTER — Encounter: Payer: Medicaid Other | Admitting: Obstetrics and Gynecology

## 2022-11-06 ENCOUNTER — Ambulatory Visit (INDEPENDENT_AMBULATORY_CARE_PROVIDER_SITE_OTHER): Payer: Medicaid Other | Admitting: General Practice

## 2022-11-06 DIAGNOSIS — Z0130 Encounter for examination of blood pressure without abnormal findings: Secondary | ICD-10-CM

## 2022-11-06 DIAGNOSIS — Z5189 Encounter for other specified aftercare: Secondary | ICD-10-CM

## 2022-11-06 NOTE — Progress Notes (Signed)
Subjective:  Gloria Cox is a 27 y.o. female here for BP check and wound check.  The wound is cleansed, debrided of foreign material as much as possible, and dressed. Pt has wound vac attached. Wound vac removed in office. Incision is clear, dry and intact. The patient is alerted to watch for any signs of infection (redness, pus, pain, increased swelling or fever) and call if such occurs. Home wound care instructions are provided. Tetanus vaccination status reviewed: last tetanus booster within 10 years.    Hypertension ROS: home BP monitoring in range of 120's systolic over 70's diastolic, no TIA's, no chest pain on exertion, no dyspnea on exertion, and noting swelling of ankles.    Objective:  BP 118/81   Pulse 69   Ht 5\' 3"  (1.6 m)   Wt 244 lb 1.6 oz (110.7 kg)   LMP 01/28/2022 (Approximate)   Breastfeeding Yes   BMI 43.24 kg/m   Appearance alert, well appearing, and in no distress and oriented to person, place, and time. General exam BP noted to be well controlled today in office.    Assessment:   Blood Pressure stable.   Plan:  Current treatment plan is effective, no change in therapy.[name]

## 2022-11-09 ENCOUNTER — Encounter: Payer: Medicaid Other | Admitting: Obstetrics & Gynecology

## 2022-11-17 NOTE — Addendum Note (Signed)
Addendum  created 11/17/22 1802 by Lannie Fields, DO   Attestation recorded in [place], Flowsheet accepted, Company secretary

## 2022-11-17 NOTE — Addendum Note (Signed)
Addendum  created 11/17/22 2034 by Linton Rump, MD   Attestation recorded in [place], Intraprocedure Attestations filed

## 2022-12-14 ENCOUNTER — Ambulatory Visit: Payer: Medicaid Other | Admitting: Medical

## 2022-12-23 ENCOUNTER — Ambulatory Visit: Payer: Medicaid Other | Admitting: Obstetrics & Gynecology

## 2023-01-07 ENCOUNTER — Encounter: Payer: Self-pay | Admitting: Obstetrics and Gynecology

## 2023-01-07 ENCOUNTER — Ambulatory Visit: Payer: Medicaid Other | Admitting: Obstetrics and Gynecology

## 2023-01-07 DIAGNOSIS — Z30017 Encounter for initial prescription of implantable subdermal contraceptive: Secondary | ICD-10-CM | POA: Insufficient documentation

## 2023-01-07 NOTE — Progress Notes (Signed)
     GYNECOLOGY CLINIC PROCEDURE NOTE  Gloria Cox is a 27 y.o. [id number] here for Nexplanon insertion.  Nexplanon Insertion Procedure Patient identified, informed consent performed, consent signed.   Patient does understand that irregular bleeding is a very common side effect of this medication. She was advised to have backup contraception for one week after placement. Pregnancy test in clinic today was negative.  Appropriate time out taken.  Patient's left arm was prepped and draped in the usual sterile fashion.. The ruler used to measure and mark insertion area.  Patient was prepped with alcohol swab and then injected with 3 ml of 1% lidocaine.  She was prepped with betadine, Nexplanon removed from packaging,  Device confirmed in needle, then inserted full length of needle and withdrawn per handbook instructions. Nexplanon was able to palpated in the patient's arm; patient palpated the insert herself. There was minimal blood loss.  Patient insertion site covered with guaze and a pressure bandage to reduce any bruising.  The patient tolerated the procedure well and was given post procedure instructions.    Nettie Elm MD, FACOG Attending Obstetrician & Gynecologist Center for [facility], [facility]

## 2023-01-07 NOTE — Progress Notes (Signed)
    Post Partum Visit Note  Gloria Cox is a 27 y.o. G28P2002 female who presents for a postpartum visit. She is 8 weeks postpartum following a repeat cesarean section.  I have fully reviewed the prenatal and intrapartum course. The delivery was at 39.1 gestational weeks.  Anesthesia: epidural. Postpartum course has been unremarkable. Baby is doing well. Baby is feeding by bottle - gentle ease . Bleeding no bleeding. Bowel function is normal. Bladder function is normal. Patient is sexually active. Last intercourse 12/28/22. Contraception method is none.   Postpartum depression screening: positive, score 13.   The pregnancy intention screening data noted above was reviewed. Potential methods of contraception were discussed. The patient elected to proceed with No data recorded.    Health Maintenance Due  Topic Date Due   HPV VACCINES (1 - Risk 3-dose series) Never done   INFLUENZA VACCINE  10/29/2022   COVID-19 Vaccine (1 - 2023-24 season) Never done    Medical records  Review of Systems Pertinent items are noted in HPI.  Objective:  LMP 01/28/2022 (Approximate)    General:  alert   Breasts:  not indicated  Lungs: clear to auscultation bilaterally  Heart:  regular rate and rhythm, S1, S2 normal, no murmur, click, rub or gallop  Abdomen: soft, non-tender; bowel sounds normal; no masses,  no organomegaly   Wound well approximated incision  GU exam:  not indicated       Assessment:    There are no diagnoses linked to this encounter.  NL postpartum exam.  H/O GDM Plan:   Essential components of care per ACOG recommendations:  1.  Mood and well being: Patient with negative depression screening today. Reviewed local resources for support.  - Patient tobacco use? No.   - hx of drug use? No.    2. Infant care and feeding:  -Patient currently breastmilk feeding? No.  -Social determinants of health (SDOH) reviewed in EPIC. No concerns  3. Sexuality, contraception and  birth spacing - Patient does not know want a pregnancy in the next year.  Desired family size is uncertain  - Reviewed reproductive life planning. Reviewed contraceptive methods based on pt preferences and effectiveness.  Patient desired Hormonal Implant today.   - Discussed birth spacing of 18 months  4. Sleep and fatigue -Encouraged family/partner/community support of 4 hrs of uninterrupted sleep to help with mood and fatigue  5. Physical Recovery  - Discussed patients delivery and complications. She describes her labor as good. - Patient had a C-section. Perineal healing reviewed. Patient expressed understanding - Patient has urinary incontinence? No. - Patient is safe to resume physical and sexual activity  6.  Health Maintenance - HM due items addressed Yes - Last pap smear  Diagnosis  Date Value Ref Range Status  05/21/2022      - Negative for intraepithelial lesion or malignancy (NILM)   Pap smear not done at today's visit.  -Breast Cancer screening indicated? No.   7. Chronic Disease/Pregnancy Condition follow up: None  Pt to schedule 2 hr PP Glucola   Nettie Elm, MD Center for [employer], [facility]

## 2023-01-18 MED ORDER — ETONOGESTREL 68 MG ~~LOC~~ IMPL
68.0000 mg | DRUG_IMPLANT | Freq: Once | SUBCUTANEOUS | Status: AC
Start: 2023-01-18 — End: 2023-01-07
  Administered 2023-01-07: 68 mg via SUBCUTANEOUS

## 2023-01-18 NOTE — Addendum Note (Signed)
Addended by: Marya Landry D on: 01/18/2023 12:10 PM   Modules accepted: Orders

## 2023-02-13 ENCOUNTER — Ambulatory Visit (INDEPENDENT_AMBULATORY_CARE_PROVIDER_SITE_OTHER): Payer: Medicaid Other

## 2023-02-13 ENCOUNTER — Encounter (HOSPITAL_COMMUNITY): Payer: Self-pay | Admitting: *Deleted

## 2023-02-13 ENCOUNTER — Encounter: Payer: Self-pay | Admitting: Obstetrics

## 2023-02-13 ENCOUNTER — Ambulatory Visit (HOSPITAL_COMMUNITY)
Admission: EM | Admit: 2023-02-13 | Discharge: 2023-02-13 | Disposition: A | Discharge status: Home / Self Care | Payer: Medicaid Other | Attending: Family Medicine | Admitting: Family Medicine

## 2023-02-13 DIAGNOSIS — J4521 Mild intermittent asthma with (acute) exacerbation: Secondary | ICD-10-CM | POA: Diagnosis not present

## 2023-02-13 DIAGNOSIS — R051 Acute cough: Secondary | ICD-10-CM

## 2023-02-13 MED ORDER — PREDNISONE 20 MG PO TABS: 40.0000 mg | ORAL_TABLET | Freq: Every day | ORAL | 0 refills | Status: DC

## 2023-02-13 MED ORDER — AZITHROMYCIN 250 MG PO TABS: 250.0000 mg | ORAL_TABLET | Freq: Every day | ORAL | 0 refills | Status: DC

## 2023-02-13 MED ORDER — HYDROCODONE BIT-HOMATROP MBR 5-1.5 MG/5ML PO SOLN: 5.0000 mL | Freq: Four times a day (QID) | ORAL | 0 refills | Status: DC | PRN

## 2023-02-13 MED ORDER — ALBUTEROL SULFATE HFA 108 (90 BASE) MCG/ACT IN AERS: 1.0000 | INHALATION_SPRAY | Freq: Four times a day (QID) | RESPIRATORY_TRACT | 1 refills | Status: DC | PRN

## 2023-02-13 NOTE — ED Triage Notes (Addendum)
Pt states she has had cough, congestion, SOB, chest pain due to coughing X 4 days. She doesn't have any inhalers she has been doing the neb treatments, last dose was last night. Pt states she is wheezing.   She was on dulera, albuterol MDI and she is ou.

## 2023-02-13 NOTE — ED Provider Notes (Signed)
[facility] CARE CENTER   [id number] 02/13/23 Arrival Time: 1154  ASSESSMENT & PLAN:  1. Acute cough   2. Mild intermittent asthma with exacerbation    Without resp distress. I have personally viewed and independently interpreted the imaging studies ordered this visit. CXR: Ques LUL infiltrate; subtle.   Meds ordered this encounter  Medications   predniSONE (DELTASONE) 20 MG tablet    Sig: Take 2 tablets (40 mg total) by mouth daily.    Dispense:  10 tablet    Refill:  0   azithromycin (ZITHROMAX) 250 MG tablet    Sig: Take 1 tablet (250 mg total) by mouth daily. Take first 2 tablets together, then 1 every day until finished.    Dispense:  6 tablet    Refill:  0   albuterol (VENTOLIN HFA) 108 (90 Base) MCG/ACT inhaler    Sig: Inhale 1-2 puffs into the lungs every 6 (six) hours as needed for wheezing or shortness of breath.    Dispense:  1 each    Refill:  1   HYDROcodone bit-homatropine (HYCODAN) 5-1.5 MG/5ML syrup    Sig: Take 5 mLs by mouth every 6 (six) hours as needed for cough.    Dispense:  90 mL    Refill:  0   [place] Controlled Substances Registry consulted for this patient. I feel the risk/benefit ratio today is favorable for proceeding with this prescription for a controlled substance. Medication sedation precautions given.  Asthma precautions given. OTC symptom care as needed.  Recommend:  Follow-up Information     [facility].   Specialty: Urgent Care Why: As needed. Contact information: [address] [place] [address] [phone]                Reviewed expectations re: course of current medical issues. Questions answered. Outlined signs and symptoms indicating need for more acute intervention. Patient verbalized understanding. After Visit Summary given.  SUBJECTIVE: History from: patient.  Gloria Cox is a 27 y.o. female who presents with complaint of cough, chest congestion, SOB,  wheezing, CP; x 4 days; maybe a few d longer. Out of inhaler. Denies current SOB but feels she is wheezing. Ques subj fever at night. Overall normal PO intake without n/v. Sick contacts: no. Ambulatory without difficulty. No LE edema.  Social History   Tobacco Use  Smoking Status Former   Current packs/day: 0.00   Types: Cigarettes   Quit date: 03/09/2018   Years since quitting: 4.9  Smokeless Tobacco Never    OBJECTIVE:  Vitals:   02/13/23 1356  BP: 121/85  Pulse: 96  Resp: 20  Temp: 97.9 F (36.6 C)  TempSrc: Oral  SpO2: 95%    General appearance: alert; NAD HEENT: [place]; AT; with nasal congestion Neck: supple without LAD Cv: RRR without murmer Lungs: unlabored respirations, moderate bilateral expiratory wheezing; active cough; no significant respiratory distress Skin: warm and dry Psychological: alert and cooperative; normal mood and affect  Imaging: DG Chest 2 View  Result Date: 02/13/2023 CLINICAL DATA:  Cough, shortness of breath, and chest pain for 4 days. EXAM: CHEST - 2 VIEW COMPARISON:  04/03/2022 FINDINGS: The heart size and mediastinal contours are within normal limits. Mild asymmetric opacity seen in the left upper lobe, suspicious for pneumonia. Right lung is clear. IMPRESSION: Mild left upper lobe opacity, suspicious for pneumonia. Electronically Signed   By: Danae Orleans M.D.   On: 02/13/2023 15:02     No Known Allergies  Past Medical History:  Diagnosis Date   Asthma    Dyspepsia    Gestational diabetes    Goiter    History of asthma 02/16/2021   Hyperlipemia 05/09/2013   Hypothyroidism    Macromastia 02/20/2014   Migraine without aura and without status migrainosus, not intractable 02/14/2015   [place]    Pre-diabetes    Status post repeat low transverse cesarean section 09/06/2021   Striae    Thyroiditis, autoimmune    Family History  Problem Relation Age of Onset   Obesity Mother    Hypertension Father    Stroke Father    Obesity  Sister    Obesity Brother    Diabetes Maternal Grandmother    Hypertension Maternal Grandmother    Obesity Maternal Grandmother    Diabetes Maternal Grandfather    Hypertension Maternal Grandfather    Heart Problems Maternal Grandfather 65   Diabetes Paternal Grandmother    Hypertension Paternal Grandmother    Diabetes Paternal Grandfather    Hypertension Paternal Grandfather    Social History   Socioeconomic History   Marital status: Single    Spouse name: Not on file   Number of children: Not on file   Years of education: Not on file   Highest education level: Not on file  Occupational History   Not on file  Tobacco Use   Smoking status: Former    Current packs/day: 0.00    Types: Cigarettes    Quit date: 03/09/2018    Years since quitting: 4.9   Smokeless tobacco: Never  Vaping Use   Vaping status: Former   Quit date: 01/11/2022   Substances: Nicotine  Substance and Sexual Activity   Alcohol use: Not Currently    Comment: not since confirmed pregnancy   Drug use: Not Currently    Types: Marijuana    Comment: none for years   Sexual activity: Yes    Partners: Male    Birth control/protection: Implant  Other Topics Concern   Not on file  Social History Narrative   Not on file   Social Determinants of Health   Financial Resource Strain: Not on file  Food Insecurity: No Food Insecurity (10/28/2022)   Hunger Vital Sign    Worried About Running Out of Food in the Last Year: Never true    Ran Out of Food in the Last Year: Never true  Transportation Needs: No Transportation Needs (10/28/2022)   PRAPARE - Administrator, Civil Service (Medical): No    Lack of Transportation (Non-Medical): No  Physical Activity: Not on file  Stress: Not on file  Social Connections: Not on file  Intimate Partner Violence: Not At Risk (10/29/2022)   Humiliation, Afraid, Rape, and Kick questionnaire    Fear of Current or Ex-Partner: No    Emotionally Abused: No     Physically Abused: No    Sexually Abused: No             Mardella Layman, MD 02/15/23 [phone]

## 2023-02-16 ENCOUNTER — Ambulatory Visit: Payer: Medicaid Other

## 2023-02-16 VITALS — BP 115/75 | HR 86 | Ht 63.0 in | Wt 236.0 lb

## 2023-02-16 DIAGNOSIS — J189 Pneumonia, unspecified organism: Secondary | ICD-10-CM | POA: Diagnosis present

## 2023-02-16 MED ORDER — DEXTROMETHORPHAN HBR 15 MG/5ML PO SYRP: 10.0000 mL | ORAL_SOLUTION | Freq: Four times a day (QID) | ORAL | 0 refills | Status: DC | PRN

## 2023-02-16 MED ORDER — MOMETASONE FURO-FORMOTEROL FUM 200-5 MCG/ACT IN AERO: 2.0000 | INHALATION_SPRAY | Freq: Two times a day (BID) | RESPIRATORY_TRACT | 2 refills | Status: DC

## 2023-02-16 NOTE — Patient Instructions (Addendum)
It was great to see you today! Thank you for choosing [facility] for your primary care.  Today we addressed: Take Dulera - two puffs 2xdaily  Use albuterol as needed every 6 hours  Finish your meds   If you haven't already, sign up for My Chart to have easy access to your labs results, and communication with your primary care physician. I recommend that you always bring your medications to each appointment as this makes it easy to ensure you are on the correct medications and helps [place] not miss refills when you need them. Call the clinic at [phone] if your symptoms worsen or you have any concerns.  Please arrive 15 minutes before your appointment to ensure smooth check in process.  We appreciate your efforts in making this happen.  Thank you for allowing me to participate in your care, Alfredo Martinez, MD 02/16/2023, 4:07 PM PGY-3, [facility]

## 2023-02-16 NOTE — Progress Notes (Signed)
    SUBJECTIVE:   CHIEF COMPLAINT / HPI:   Urgent Care Follow Up:  -Patient presented with a cough to the [facility] urgent care on 02/13/2023 and received azithromycin and prednisone for concern of left upper lobe infiltrate on chest x-ray as well as albuterol and Hycodan cough syrup -Stopped vaping  -Notes that her cough is slightly improved but not resolved  -Continues to wheeze  -No oxygen need at present -No fever or chills -Has almost completed abx  -Previously admitted to hospital for PNA  -Has been using albuterol but is not helping long term -Has been unable to get PFTs  Mood  Does mention that she has passively thought about "not being here anymore" which she relates to post partum depression after last baby. Has follow up tomorrow to discuss options. No active SI/HI.   PERTINENT  PMH / PSH: Reviewed   OBJECTIVE:   BP 115/75   Pulse 86   Ht 5\' 3"  (1.6 m)   Wt 236 lb (107 kg)   LMP 02/05/2023 (Exact Date)   SpO2 97%   BMI 41.81 kg/m   General: Alert and oriented in no apparent distress Heart: Regular rate and rhythm with no murmurs appreciated Lungs: Diffuse expiratory wheezing throughout, no rhonchi or stridor  Abdomen: Bowel sounds present, no abdominal pain Skin: Warm and dry   ASSESSMENT/PLAN:   Assessment & Plan Pneumonia due to infectious organism, unspecified laterality, unspecified part of lung Previously treated with Dulera dosage to 200/5 two puffs 2xdaily, reordered to use with albuterol q4-6hr PRN. Ordered dextromethorphan as well. Normal oxygen saturation on RA and otherwise well appearing but I am concerned about the likely asthma and previous hospitalization. Will have her closely follow up with use in next couple of weeks. May need repeat CXR in coming weeks to assess for resolution and needs PFT once no longer in acute illness.     Alfredo Martinez, MD [facility]

## 2023-02-18 ENCOUNTER — Encounter: Payer: Self-pay | Admitting: Obstetrics & Gynecology

## 2023-02-18 ENCOUNTER — Telehealth (INDEPENDENT_AMBULATORY_CARE_PROVIDER_SITE_OTHER): Payer: Medicaid Other | Admitting: Obstetrics & Gynecology

## 2023-02-18 DIAGNOSIS — F53 Postpartum depression: Secondary | ICD-10-CM

## 2023-02-18 DIAGNOSIS — Z975 Presence of (intrauterine) contraceptive device: Secondary | ICD-10-CM | POA: Insufficient documentation

## 2023-02-18 MED ORDER — CITALOPRAM HYDROBROMIDE 20 MG PO TABS: 20.0000 mg | ORAL_TABLET | Freq: Every day | ORAL | 12 refills | Status: DC

## 2023-02-18 NOTE — Progress Notes (Signed)
TELEHEALTH GYNECOLOGY VISIT ENCOUNTER NOTE  Provider location: Center for [facility] at [facility]   Patient location: Home  I connected with Gloria Cox on 02/18/23 at  1:50 PM EST by telephone and verified that I am speaking with the correct person using two identifiers. Patient was unable to do MyChart audiovisual encounter due to technical difficulties, she tried several times.    I discussed the limitations, risks, security and privacy concerns of performing an evaluation and management service by telephone and the availability of in person appointments. I also discussed with the patient that there may be a patient responsible charge related to this service. The patient expressed understanding and agreed to proceed.   History:4 months postpartum  Gloria Cox is a 27 y.o. [phone] female being evaluated today for anxiety and depression 4 months postpartum. She denies any abnormal vaginal discharge, bleeding, pelvic pain. She has sleep problems, anxiety and panic attacks, depression and lack of motivation. As a teen she was followed for depression and was on medication but we have no records     Past Medical History:  Diagnosis Date   Asthma    Dyspepsia    Gestational diabetes    Goiter    History of asthma 02/16/2021   Hyperlipemia 05/09/2013   Hypothyroidism    Macromastia 02/20/2014   Migraine without aura and without status migrainosus, not intractable 02/14/2015   [place]    Pre-diabetes    Status post repeat low transverse cesarean section 09/06/2021   Striae    Thyroiditis, autoimmune    Past Surgical History:  Procedure Laterality Date   CESAREAN SECTION N/A 09/06/2021   Procedure: CESAREAN SECTION;  Surgeon: [place] Bing, MD;  Location: [facility] LD ORS;  Service: Obstetrics;  Laterality: N/A;   CESAREAN SECTION N/A 10/29/2022   Procedure: CESAREAN SECTION;  Surgeon: Federico Flake, MD;  Location: [facility] LD ORS;  Service: Obstetrics;  Laterality: N/A;    CHOLECYSTECTOMY N/A 12/18/2015   Procedure: LAPAROSCOPIC CHOLECYSTECTOMY;  Surgeon: Emelia Loron, MD;  Location: [facility] ORS;  Service: General;  Laterality: N/A;   WISDOM TOOTH EXTRACTION  03/30/2012   The following portions of the patient's history were reviewed and updated as appropriate: allergies, current medications, past family history, past medical history, past social history, past surgical history and problem list.   Health Maintenance:  Normal pap and negative HRHPV on 04/2022.    Review of Systems:  Pertinent items noted in HPI and remainder of comprehensive ROS otherwise negative.  Physical Exam:   General:  Alert, oriented and cooperative.   Mental Status: Normal mood and affect perceived. Normal judgment and thought content.  Physical exam deferred due to nature of the encounter  Labs and Imaging No results found for this or any previous visit (from the past 336 hour(s)). DG Chest 2 View  Result Date: 02/13/2023 CLINICAL DATA:  Cough, shortness of breath, and chest pain for 4 days. EXAM: CHEST - 2 VIEW COMPARISON:  04/03/2022 FINDINGS: The heart size and mediastinal contours are within normal limits. Mild asymmetric opacity seen in the left upper lobe, suspicious for pneumonia. Right lung is clear. IMPRESSION: Mild left upper lobe opacity, suspicious for pneumonia. Electronically Signed   By: Danae Orleans M.D.   On: 02/13/2023 15:02      Assessment and Plan:     1. Postpartum depression  - citalopram (CELEXA) 20 MG tablet; Take 1 tablet (20 mg total) by mouth daily.  Dispense: 30 tablet; Refill: 12 - Ambulatory referral  to [facility]       I discussed the assessment and treatment plan with the patient. The patient was provided an opportunity to ask questions and all were answered. The patient agreed with the plan and demonstrated an understanding of the instructions.   The patient was advised to call back or seek an in-person evaluation/go to the  ED if the symptoms worsen or if the condition fails to improve as anticipated.  I provided 15 minutes of non-face-to-face time during this encounter.   Scheryl Darter, MD Center for [facility], [facility]

## 2023-02-18 NOTE — Progress Notes (Signed)
Patient presents for a Mychart to discuss postpartum depression. Patient had a PP visit on 10/10. PPD score at that visit was a 13. Denies having any thought of harming herself. Score today was a 25. Patient does have a history of depression.

## 2023-02-22 ENCOUNTER — Telehealth: Payer: Self-pay | Admitting: Licensed Clinical Social Worker

## 2023-02-22 NOTE — Telephone Encounter (Signed)
[facility] contacted patient on this date to provide [facility] intro and to schedule [facility] appointment. A VM was left.

## 2023-03-24 ENCOUNTER — Other Ambulatory Visit: Payer: Self-pay | Admitting: Obstetrics & Gynecology

## 2023-03-24 DIAGNOSIS — F53 Postpartum depression: Secondary | ICD-10-CM

## 2023-05-10 ENCOUNTER — Encounter (HOSPITAL_COMMUNITY): Payer: Self-pay

## 2023-05-10 ENCOUNTER — Ambulatory Visit (HOSPITAL_COMMUNITY)
Admission: RE | Admit: 2023-05-10 | Discharge: 2023-05-10 | Disposition: A | Discharge status: Home / Self Care | Payer: Medicaid Other | Source: Ambulatory Visit | Attending: Emergency Medicine | Admitting: Emergency Medicine

## 2023-05-10 VITALS — BP 120/75 | HR 67 | Temp 97.9°F | Resp 16 | Ht 62.0 in | Wt 232.0 lb

## 2023-05-10 DIAGNOSIS — R112 Nausea with vomiting, unspecified: Secondary | ICD-10-CM | POA: Insufficient documentation

## 2023-05-10 DIAGNOSIS — R197 Diarrhea, unspecified: Secondary | ICD-10-CM | POA: Diagnosis not present

## 2023-05-10 DIAGNOSIS — R1011 Right upper quadrant pain: Secondary | ICD-10-CM | POA: Diagnosis present

## 2023-05-10 LAB — POCT URINALYSIS DIP (MANUAL ENTRY)
Bilirubin, UA: NEGATIVE
Blood, UA: NEGATIVE
Glucose, UA: NEGATIVE mg/dL
Ketones, POC UA: NEGATIVE mg/dL
Leukocytes, UA: NEGATIVE
Nitrite, UA: NEGATIVE
Spec Grav, UA: >=1.03 — AB (ref 1.010–1.025)
Urobilinogen, UA: 0.2 U/dL
pH, UA: 6.5 (ref 5.0–8.0)

## 2023-05-10 LAB — CBC
HCT: 38.6 % (ref 36.0–46.0)
Hemoglobin: 12.4 g/dL (ref 12.0–15.0)
MCH: 27.3 pg (ref 26.0–34.0)
MCHC: 32.1 g/dL (ref 30.0–36.0)
MCV: 84.8 fL (ref 80.0–100.0)
Platelets: 216 10*3/uL (ref 150–400)
RBC: 4.55 MIL/uL (ref 3.87–5.11)
RDW: 13.7 % (ref 11.5–15.5)
WBC: 8.4 10*3/uL (ref 4.0–10.5)
nRBC: 0 % (ref 0.0–0.2)

## 2023-05-10 LAB — BASIC METABOLIC PANEL
Anion gap: 10 (ref 5–15)
BUN: 9 mg/dL (ref 6–20)
CO2: 23 mmol/L (ref 22–32)
Calcium: 9.2 mg/dL (ref 8.9–10.3)
Chloride: 105 mmol/L (ref 98–111)
Creatinine, Ser: 0.64 mg/dL (ref 0.44–1.00)
GFR, Estimated: >60 mL/min (ref >60)
Glucose, Bld: 85 mg/dL (ref 70–99)
Potassium: 4.1 mmol/L (ref 3.5–5.1)
Sodium: 138 mmol/L (ref 135–145)

## 2023-05-10 LAB — LIPASE, BLOOD: Lipase: 22 U/L (ref 11–51)

## 2023-05-10 MED ORDER — ONDANSETRON 4 MG PO TBDP: 4.0000 mg | ORAL_TABLET | Freq: Three times a day (TID) | ORAL | 0 refills | Status: DC | PRN

## 2023-05-10 NOTE — ED Provider Notes (Signed)
 [facility]    CSN: [id number] Arrival date & time: 05/10/23  1353      History   Chief Complaint Chief Complaint  Patient presents with   Abdominal Pain    Intense abdominal pain as well as as intense back pain. With fever ranging from 101.9 the highest to 99.7 the lowest. Cold sweats. Diarrhea as well as nausea. This is day 3 started on the 6th. - Entered by patient    HPI Gloria Cox is a 28 y.o. female.   Patient presents with right upper abdominal pain that radiates to bilateral low back, nausea, vomiting, and diarrhea. Denies fever, urinary symptoms, blood in stool or vomit. Hx of cholecystectomy and two c-sections without complication.    Abdominal Pain   Past Medical History:  Diagnosis Date   Asthma    Dyspepsia    Gestational diabetes    Goiter    History of asthma 02/16/2021   Hyperlipemia 05/09/2013   Hypothyroidism    Macromastia 02/20/2014   Migraine without aura and without status migrainosus, not intractable 02/14/2015   [place]    Pre-diabetes    Status post repeat low transverse cesarean section 09/06/2021   Striae    Thyroiditis, autoimmune     Patient Active Problem List   Diagnosis Date Noted   Nexplanon  in place 02/18/2023   Gestational diabetes mellitus (GDM) in third trimester controlled on oral hypoglycemic drug 10/28/2022   Tobacco use 12/17/2021   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    Past Surgical History:  Procedure Laterality Date   CESAREAN SECTION N/A 09/06/2021   Procedure: CESAREAN SECTION;  Surgeon: Raynell Caller, MD;  Location: [facility] LD [facility]S;  Service: Obstetrics;  Laterality: N/A;   CESAREAN SECTION N/A 10/29/2022   Procedure: CESAREAN SECTION;  Surgeon: Abner Ables, MD;  Location: [facility]S;  Service: Obstetrics;  Laterality: N/A;   CHOLECYSTECTOMY N/A 12/18/2015   Procedure: LAPAROSCOPIC CHOLECYSTECTOMY;  Surgeon: Enid Harry, MD;  Location: [facility] ORS;  Service: General;   Laterality: N/A;   WISDOM TOOTH EXTRACTION  03/30/2012    OB History     Gravida  2   Para  2   Term  2   Preterm      AB      Living  2      SAB      IAB      Ectopic      Multiple  0   Live Births  2            Home Medications    Prior to Admission medications   Medication Sig Start Date End Date Taking? Authorizing Provider  ondansetron  (ZOFRAN -ODT) 4 MG disintegrating tablet Take 1 tablet (4 mg total) by mouth every 8 (eight) hours as needed for nausea or vomiting. 05/10/23  Yes Levora Reas A, NP  albuterol  (PROVENTIL ) (2.5 MG/3ML) 0.083% nebulizer solution Take 3 mLs (2.5 mg total) by nebulization every 6 (six) hours as needed for wheezing or shortness of breath. 04/03/22   Ivin Marrow, MD  albuterol  (VENTOLIN  HFA) 108 (90 Base) MCG/ACT inhaler Inhale 1-2 puffs into the lungs every 6 (six) hours as needed for wheezing or shortness of breath. 02/13/23   Afton Albright, MD  etonogestrel  (NEXPLANON ) 68 MG IMPL implant Inject 1 implant by subcutaneous route. 11/07/14   [provider]  mometasone -formoterol  (DULERA ) 200-5 MCG/ACT AERO Inhale 2 puffs into the lungs in the morning and at bedtime. 02/16/23   Maxwell,  Allee, MD  budesonide  (PULMICORT ) 180 MCG/ACT inhaler Inhale 2 puffs into the lungs as needed.  06/14/11  [provider]    Family History Family History  Problem Relation Age of Onset   Obesity Mother    Hypertension Father    Stroke Father    Obesity Sister    Obesity Brother    Diabetes Maternal Grandmother    Hypertension Maternal Grandmother    Obesity Maternal Grandmother    Diabetes Maternal Grandfather    Hypertension Maternal Grandfather    Heart Problems Maternal Grandfather 43   Diabetes Paternal Grandmother    Hypertension Paternal Grandmother    Diabetes Paternal Grandfather    Hypertension Paternal Grandfather     Social History Social History   Tobacco Use   Smoking status: Former    Current  packs/day: 0.00    Types: Cigarettes    Quit date: 03/09/2018    Years since quitting: 5.1   Smokeless tobacco: Never  Vaping Use   Vaping status: Former   Quit date: 01/11/2022   Substances: Nicotine   Substance Use Topics   Alcohol use: Not Currently    Comment: not since confirmed pregnancy   Drug use: Not Currently    Types: Marijuana    Comment: none for years     Allergies   Patient has no known allergies.   Review of Systems Review of Systems  Gastrointestinal:  Positive for abdominal pain.   Per HPI  Physical Exam Triage Vital Signs ED Triage Vitals  Encounter Vitals Group     BP 05/10/23 1455 120/75     Systolic BP Percentile --      Diastolic BP Percentile --      Pulse Rate 05/10/23 1455 67     Resp 05/10/23 1455 16     Temp 05/10/23 1455 97.9 F (36.6 C)     Temp Source 05/10/23 1455 Oral     SpO2 05/10/23 1455 98 %     Weight 05/10/23 1455 232 lb (105.2 kg)     Height 05/10/23 1455 5\' 2"  (1.575 m)     Head Circumference --      Peak Flow --      Pain Score 05/10/23 1454 8     Pain Loc --      Pain Education --      Exclude from Growth Chart --    No data found.  Updated Vital Signs BP 120/75 (BP Location: Right Arm)   Pulse 67   Temp 97.9 F (36.6 C) (Oral)   Resp 16   Ht 5\' 2"  (1.575 m)   Wt 232 lb (105.2 kg)   LMP 03/31/2023 (Approximate)   SpO2 98%   Breastfeeding No   BMI 42.43 kg/m   Visual Acuity Right Eye Distance:   Left Eye Distance:   Bilateral Distance:    Right Eye Near:   Left Eye Near:    Bilateral Near:     Physical Exam Vitals and nursing note reviewed.  Constitutional:      General: She is awake. She is not in acute distress.    Appearance: Normal appearance. She is well-developed and well-groomed. She is not ill-appearing.  Cardiovascular:     Rate and Rhythm: Normal rate and regular rhythm.  Pulmonary:     Effort: Pulmonary effort is normal.     Breath sounds: Normal breath sounds.  Abdominal:      General: Abdomen is protuberant. Bowel sounds are increased.  Palpations: Abdomen is soft.     Tenderness: There is abdominal tenderness in the right upper quadrant. There is no right CVA tenderness, left CVA tenderness, guarding or rebound. Negative signs include Murphy's sign, Rovsing's sign and McBurney's sign.  Skin:    General: Skin is warm and dry.  Neurological:     Mental Status: She is alert.  Psychiatric:        Behavior: Behavior is cooperative.      UC Treatments / Results  Labs (all labs ordered are listed, but only abnormal results are displayed) Labs Reviewed  POCT URINALYSIS DIP (MANUAL ENTRY) - Abnormal; Notable for the following components:      Result Value   Spec Grav, UA >=1.030 (*)    Protein Ur, POC trace (*)    All other components within normal limits  CBC  BASIC METABOLIC PANEL  LIPASE, BLOOD    EKG   Radiology No results found.  Procedures Procedures (including critical care time)  Medications Ordered in UC Medications - No data to display  Initial Impression / Assessment and Plan / UC Course  I have reviewed the triage vital signs and the nursing notes.  Pertinent labs & imaging results that were available during my care of the patient were reviewed by me and considered in my medical decision making (see chart for details).     Patient presented with right upper quadrant pain that radiates to bilateral low back, nausea, vomiting, and diarrhea. Denies any other symptoms. Reports symptoms are worse with eating. Hx of cholecystectomy and two c-sections with complication.   Upon assessment tender upon palpation to RUQ, without CVA tenderness. UA results were insignificant and did not reveal any signs of infection. Ordered CBC, BMP, and lipase for further evaluation of abdominal pain.  Prescribed Zofran  as needed for nausea/vomiting. Discussed OTC medication for symptoms. Discussed follow-up, return, and strict ER precautions.   Final  Clinical Impressions(s) / UC Diagnoses   Final diagnoses:  Right upper quadrant abdominal pain  Nausea vomiting and diarrhea     Discharge Instructions      You can take Zofran  as needed for nausea and vomiting. You can also try taking Imodium as needed for diarrhea. Alternate between Tylenol  and Ibuprofen  as needed for pain. Your lab results should come back later today and I will give you a call if there are any concerning results that require immediate treatment. Otherwise I would recommend following up with primary care provider for further evaluation of your abdominal pain. If you develop worsening abdominal pain, excessive vomiting, high fevers, or passing out please seek immediate medical treatment in the ER.     ED Prescriptions     Medication Sig Dispense Auth. Provider   ondansetron  (ZOFRAN -ODT) 4 MG disintegrating tablet Take 1 tablet (4 mg total) by mouth every 8 (eight) hours as needed for nausea or vomiting. 10 tablet Levora Reas A, NP      PDMP not reviewed this encounter.   Levora Reas A, NP 05/10/23 [phone]

## 2023-05-10 NOTE — ED Triage Notes (Signed)
 Patient here today with c/o right side abd pain and LB pain X 5 days. Patient noticed that she will have sharp pains when she takes deep breaths. Patient has also been vomiting off and on since Friday has not vomited today.

## 2023-05-10 NOTE — Discharge Instructions (Addendum)
 You can take Zofran  as needed for nausea and vomiting. You can also try taking Imodium as needed for diarrhea. Alternate between Tylenol  and Ibuprofen  as needed for pain. Your lab results should come back later today and I will give you a call if there are any concerning results that require immediate treatment. Otherwise I would recommend following up with primary care provider for further evaluation of your abdominal pain. If you develop worsening abdominal pain, excessive vomiting, high fevers, or passing out please seek immediate medical treatment in the ER.

## 2023-05-26 ENCOUNTER — Ambulatory Visit: Payer: Medicaid Other

## 2023-05-26 NOTE — Progress Notes (Deleted)
    SUBJECTIVE:   CHIEF COMPLAINT / HPI: sick for 2 weeks  ***  PERTINENT  PMH / PSH: Hx of Cholecystomy, Hx of Migraines  OBJECTIVE:   LMP 03/31/2023 (Approximate)   ***  ASSESSMENT/PLAN:   Assessment & Plan  No follow-ups on file.  Celine Mans, MD [facility]

## 2023-06-29 ENCOUNTER — Ambulatory Visit
Admission: RE | Admit: 2023-06-29 | Discharge: 2023-06-29 | Disposition: A | Discharge status: Home / Self Care | Payer: Self-pay | Source: Ambulatory Visit | Attending: Family Medicine | Admitting: Family Medicine

## 2023-06-29 VITALS — BP 117/84 | HR 89 | Temp 98.0°F | Resp 16

## 2023-06-29 DIAGNOSIS — R051 Acute cough: Secondary | ICD-10-CM

## 2023-06-29 DIAGNOSIS — B349 Viral infection, unspecified: Secondary | ICD-10-CM | POA: Diagnosis not present

## 2023-06-29 DIAGNOSIS — J4521 Mild intermittent asthma with (acute) exacerbation: Secondary | ICD-10-CM

## 2023-06-29 DIAGNOSIS — R062 Wheezing: Secondary | ICD-10-CM

## 2023-06-29 LAB — POC COVID19/FLU A&B COMBO
Covid Antigen, POC: NEGATIVE
Influenza A Antigen, POC: NEGATIVE
Influenza B Antigen, POC: NEGATIVE

## 2023-06-29 MED ORDER — ALBUTEROL SULFATE HFA 108 (90 BASE) MCG/ACT IN AERS
1.0000 | INHALATION_SPRAY | Freq: Four times a day (QID) | RESPIRATORY_TRACT | 0 refills | Status: DC | PRN
Start: 2023-06-29 — End: 2023-09-10

## 2023-06-29 MED ORDER — IPRATROPIUM-ALBUTEROL 0.5-2.5 (3) MG/3ML IN SOLN
3.0000 mL | Freq: Once | RESPIRATORY_TRACT | Status: AC
  Administered 2023-06-29: 3 mL via RESPIRATORY_TRACT

## 2023-06-29 MED ORDER — PROMETHAZINE-DM 6.25-15 MG/5ML PO SYRP
5.0000 mL | ORAL_SOLUTION | Freq: Four times a day (QID) | ORAL | 0 refills | Status: AC | PRN
Start: 2023-06-29 — End: ?

## 2023-06-29 MED ORDER — PREDNISONE 20 MG PO TABS
40.0000 mg | ORAL_TABLET | Freq: Every day | ORAL | 0 refills | Status: AC
Start: 2023-06-29 — End: 2023-07-04

## 2023-06-29 NOTE — ED Provider Notes (Signed)
 [facility]    CSN: [id number] Arrival date & time: 06/29/23  1025      History   Chief Complaint Chief Complaint  Patient presents with   Cough    Have had a really bad cough for 2 days and nights now. Along with painful tension in between my eyes. Lots of congestion. Trouble breathing Hot sweats but cold to the touch. - Entered by patient    HPI Gloria Cox is a 28 y.o. female  presents for evaluation of URI symptoms for 2 days. Patient reports associated symptoms of cough, congestion, sore throat, vomiting, chills, wheezing, shortness of breath, ear pain. Denies diarrhea, body aches. Patient does have a hx of asthma.  States she takes [place] but is out of her albuterol inhaler.  Patient is not an active smoker.   Reports  sick contacts via children.  Pt has taken cold symptoms OTC for symptoms. Pt has no other concerns at this time.    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   Migraine without aura and without status migrainosus, not intractable 02/14/2015   [place]    Pre-diabetes    Status post repeat low transverse cesarean section 09/06/2021   [place]    Thyroiditis, autoimmune     Patient Active Problem List   Diagnosis Date Noted   Nexplanon in place 02/18/2023   Gestational diabetes mellitus (GDM) in third trimester controlled on oral hypoglycemic drug 10/28/2022   Tobacco use 12/17/2021   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    Past Surgical History:  Procedure Laterality Date   CESAREAN SECTION N/A 09/06/2021   Procedure: CESAREAN SECTION;  Surgeon: [place] Bing, MD;  Location: [facility];  Service: Obstetrics;  Laterality: N/A;   CESAREAN SECTION N/A 10/29/2022   Procedure: CESAREAN  SECTION;  Surgeon: Federico Flake, MD;  Location: [facility];  Service: Obstetrics;  Laterality: N/A;   CHOLECYSTECTOMY N/A 12/18/2015   Procedure: LAPAROSCOPIC CHOLECYSTECTOMY;  Surgeon: Emelia Loron, MD;  Location: [facility];  Service: General;  Laterality: N/A;   WISDOM TOOTH EXTRACTION  03/30/2012    OB History     Gravida  2   Para  2   Term  2   Preterm      AB      Living  2      SAB      IAB      Ectopic      Multiple  0   Live Births  2            Home Medications    Prior to Admission medications   Medication Sig Start Date End Date Taking? Authorizing Provider  albuterol (VENTOLIN HFA) 108 (90 Base) MCG/ACT inhaler Inhale 1-2 puffs into the lungs every 6 (six) hours as needed. 06/29/23  Yes Radford Pax, NP  predniSONE (DELTASONE) 20 MG tablet Take 2 tablets (40 mg total) by mouth daily with breakfast for 5 days. 06/29/23 07/04/23 Yes Radford Pax, NP  promethazine-dextromethorphan (PROMETHAZINE-DM) 6.25-15 MG/5ML syrup Take 5 mLs by mouth 4 (four) times daily as needed for cough. 06/29/23  Yes Radford Pax, NP  etonogestrel (NEXPLANON) 68 MG IMPL implant  Inject 1 implant by subcutaneous route. 11/07/14   [provider]  mometasone-formoterol (DULERA) 200-5 MCG/ACT AERO Inhale 2 puffs into the lungs in the morning and at bedtime. 02/16/23   Alfredo Martinez, MD  ondansetron (ZOFRAN-ODT) 4 MG disintegrating tablet Take 1 tablet (4 mg total) by mouth every 8 (eight) hours as needed for nausea or vomiting. 05/10/23   Wynonia Lawman A, NP  budesonide (PULMICORT) 180 MCG/ACT inhaler Inhale 2 puffs into the lungs as needed.  06/14/11  [provider]    Family History Family History  Problem Relation Age of Onset   Obesity Mother    Hypertension Father    Stroke Father    Obesity Sister    Obesity Brother    Diabetes Maternal Grandmother    Hypertension Maternal Grandmother    Obesity Maternal Grandmother    Diabetes Maternal  Grandfather    Hypertension Maternal Grandfather    Heart Problems Maternal Grandfather 48   Diabetes Paternal Grandmother    Hypertension Paternal Grandmother    Diabetes Paternal Grandfather    Hypertension Paternal Grandfather     Social History Social History   Tobacco Use   Smoking status: Former    Current packs/day: 0.00    Types: Cigarettes    Quit date: 03/09/2018    Years since quitting: 5.3   Smokeless tobacco: Never  Vaping Use   Vaping status: Former   Quit date: 01/11/2022   Substances: Nicotine  Substance Use Topics   Alcohol use: Not Currently    Comment: not since confirmed pregnancy   Drug use: Not Currently    Types: Marijuana    Comment: none for years     Allergies   Patient has no known allergies.   Review of Systems Review of Systems  Constitutional:  Positive for chills.  HENT:  Positive for congestion and sore throat.   Respiratory:  Positive for cough, shortness of breath and wheezing.   Gastrointestinal:  Positive for vomiting.     Physical Exam Triage Vital Signs ED Triage Vitals  Encounter Vitals Group     BP 06/29/23 1048 117/84     Systolic BP Percentile --      Diastolic BP Percentile --      Pulse Rate 06/29/23 1048 89     Resp 06/29/23 1048 16     Temp 06/29/23 1048 98 F (36.7 C)     Temp Source 06/29/23 1048 Oral     SpO2 06/29/23 1048 95 %     Weight --      Height --      Head Circumference --      Peak Flow --      Pain Score 06/29/23 1047 8     Pain Loc --      Pain Education --      Exclude from Growth Chart --    No data found.  Updated Vital Signs BP 117/84 (BP Location: Right Arm)   Pulse 89   Temp 98 F (36.7 C) (Oral)   Resp 16   LMP 05/27/2023 (Exact Date)   SpO2 95%   Visual Acuity Right Eye Distance:   Left Eye Distance:   Bilateral Distance:    Right Eye Near:   Left Eye Near:    Bilateral Near:     Physical Exam Vitals and nursing note reviewed.  Constitutional:      General:  She is not in acute distress.    Appearance: She is well-developed. She  is not ill-appearing.  HENT:     Head: Normocephalic and atraumatic.     Right Ear: Tympanic membrane and ear canal normal.     Left Ear: Tympanic membrane and ear canal normal.     Nose: Congestion present.     Mouth/Throat:     Mouth: Mucous membranes are moist.     Pharynx: Oropharynx is clear. Uvula midline. Posterior oropharyngeal erythema present.     Tonsils: No tonsillar exudate or tonsillar abscesses.  Eyes:     Conjunctiva/sclera: Conjunctivae normal.     Pupils: Pupils are equal, round, and reactive to light.  Cardiovascular:     Rate and Rhythm: Normal rate and regular rhythm.     Heart sounds: Normal heart sounds.  Pulmonary:     Effort: Pulmonary effort is normal.     Breath sounds: Examination of the right-upper field reveals wheezing. Examination of the right-lower field reveals wheezing. Examination of the left-lower field reveals wheezing. Wheezing present.  Musculoskeletal:     Cervical back: Normal range of motion and neck supple.  Lymphadenopathy:     Cervical: No cervical adenopathy.  Skin:    General: Skin is warm and dry.  Neurological:     General: No focal deficit present.     Mental Status: She is alert and oriented to person, place, and time.  Psychiatric:        Mood and Affect: Mood normal.        Behavior: Behavior normal.      UC Treatments / Results  Labs (all labs ordered are listed, but only abnormal results are displayed) Labs Reviewed  POC COVID19/FLU A&B COMBO    EKG   Radiology No results found.  Procedures Procedures (including critical care time)  Medications Ordered in UC Medications  ipratropium-albuterol (DUONEB) 0.5-2.5 (3) MG/3ML nebulizer solution 3 mL (3 mLs Nebulization Given 06/29/23 1115)    Initial Impression / Assessment and Plan / UC Course  I have reviewed the triage vital signs and the nursing notes.  Pertinent labs & imaging results  that were available during my care of the patient were reviewed by me and considered in my medical decision making (see chart for details).     Reviewed exam and symptoms with patient.  No red flags.  Negative rapid flu and COVID testing.  Wheezing resolved after nebulizer patient reports improvement in breathing.  Discussed viral illness with asthma exacerbation.  Did refill her albuterol inhaler.  Start prednisone daily for 5 days.  Promethazine DM as needed for cough, side effect profile reviewed.  Discussed rest fluids and PCP follow-up if symptoms do not improve.  ER precautions reviewed and patient verbalized understanding. Final Clinical Impressions(s) / UC Diagnoses   Final diagnoses:  Wheezing  Acute cough  Viral illness  Mild intermittent asthma with acute exacerbation     Discharge Instructions      Start prednisone daily for 5 days.  You may use your albuterol inhaler as needed for wheezing or shortness of breath.  You may also take Promethazine DM as needed for your cough.  Please note this medication will make you drowsy.  Do not drink alcohol or drive on this medication.  Lots of rest and fluids.  Please follow-up with your PCP if your symptoms do not improve.  Please go to the ER for any worsening symptoms.  Hope you feel better soon!     ED Prescriptions     Medication Sig Dispense Auth. Provider   albuterol (  VENTOLIN HFA) 108 (90 Base) MCG/ACT inhaler Inhale 1-2 puffs into the lungs every 6 (six) hours as needed. 1 each Radford Pax, NP   predniSONE (DELTASONE) 20 MG tablet Take 2 tablets (40 mg total) by mouth daily with breakfast for 5 days. 10 tablet Radford Pax, NP   promethazine-dextromethorphan (PROMETHAZINE-DM) 6.25-15 MG/5ML syrup Take 5 mLs by mouth 4 (four) times daily as needed for cough. 118 mL Radford Pax, NP      PDMP not reviewed this encounter.   Radford Pax, NP 06/29/23 1140

## 2023-06-29 NOTE — ED Triage Notes (Signed)
 Pt presents with c/o bad cough for 2 days and nights. States she has pain and tension in between her eyes, nasal congestion and trouble breathing. Reports she feels hot and is sweats a lot but feels cold to the touch

## 2023-06-29 NOTE — Discharge Instructions (Addendum)
 Start prednisone daily for 5 days.  You may use your albuterol inhaler as needed for wheezing or shortness of breath.  You may also take Promethazine DM as needed for your cough.  Please note this medication will make you drowsy.  Do not drink alcohol or drive on this medication.  Lots of rest and fluids.  Please follow-up with your PCP if your symptoms do not improve.  Please go to the ER for any worsening symptoms.  Hope you feel better soon!

## 2023-07-10 ENCOUNTER — Emergency Department (HOSPITAL_COMMUNITY)

## 2023-07-10 ENCOUNTER — Other Ambulatory Visit: Payer: Self-pay

## 2023-07-10 ENCOUNTER — Emergency Department (HOSPITAL_COMMUNITY)
Admission: EM | Admit: 2023-07-10 | Discharge: 2023-07-10 | Disposition: A | Discharge status: Home / Self Care | Attending: Emergency Medicine | Admitting: Emergency Medicine

## 2023-07-10 ENCOUNTER — Encounter (HOSPITAL_COMMUNITY): Payer: Self-pay

## 2023-07-10 DIAGNOSIS — N3000 Acute cystitis without hematuria: Secondary | ICD-10-CM | POA: Insufficient documentation

## 2023-07-10 DIAGNOSIS — R1032 Left lower quadrant pain: Secondary | ICD-10-CM | POA: Diagnosis present

## 2023-07-10 LAB — RESP PANEL BY RT-PCR (RSV, FLU A&B, COVID)  RVPGX2
Influenza A by PCR: NEGATIVE
Influenza B by PCR: NEGATIVE
Resp Syncytial Virus by PCR: NEGATIVE
SARS Coronavirus 2 by RT PCR: NEGATIVE

## 2023-07-10 LAB — CBC
HCT: 47.6 % — ABNORMAL HIGH (ref 36.0–46.0)
Hemoglobin: 15.6 g/dL — ABNORMAL HIGH (ref 12.0–15.0)
MCH: 27.5 pg (ref 26.0–34.0)
MCHC: 32.8 g/dL (ref 30.0–36.0)
MCV: 84 fL (ref 80.0–100.0)
Platelets: 291 10*3/uL (ref 150–400)
RBC: 5.67 MIL/uL — ABNORMAL HIGH (ref 3.87–5.11)
RDW: 13.5 % (ref 11.5–15.5)
WBC: 11.1 10*3/uL — ABNORMAL HIGH (ref 4.0–10.5)
nRBC: 0 % (ref 0.0–0.2)

## 2023-07-10 LAB — URINALYSIS, ROUTINE W REFLEX MICROSCOPIC
Bilirubin Urine: NEGATIVE
Glucose, UA: NEGATIVE mg/dL
Hgb urine dipstick: NEGATIVE
Ketones, ur: NEGATIVE mg/dL
Nitrite: NEGATIVE
Protein, ur: 30 mg/dL — AB
Specific Gravity, Urine: 1.023 (ref 1.005–1.030)
pH: 5 (ref 5.0–8.0)

## 2023-07-10 LAB — COMPREHENSIVE METABOLIC PANEL WITH GFR
ALT: 43 U/L (ref 0–44)
AST: 23 U/L (ref 15–41)
Albumin: 3.9 g/dL (ref 3.5–5.0)
Alkaline Phosphatase: 73 U/L (ref 38–126)
Anion gap: 10 (ref 5–15)
BUN: 10 mg/dL (ref 6–20)
CO2: 18 mmol/L — ABNORMAL LOW (ref 22–32)
Calcium: 9.2 mg/dL (ref 8.9–10.3)
Chloride: 106 mmol/L (ref 98–111)
Creatinine, Ser: 0.75 mg/dL (ref 0.44–1.00)
GFR, Estimated: >60 mL/min (ref >60)
Glucose, Bld: 103 mg/dL — ABNORMAL HIGH (ref 70–99)
Potassium: 4.2 mmol/L (ref 3.5–5.1)
Sodium: 134 mmol/L — ABNORMAL LOW (ref 135–145)
Total Bilirubin: 0.7 mg/dL (ref 0.0–1.2)
Total Protein: 7.9 g/dL (ref 6.5–8.1)

## 2023-07-10 LAB — LIPASE, BLOOD: Lipase: 24 U/L (ref 11–51)

## 2023-07-10 LAB — HCG, SERUM, QUALITATIVE: Preg, Serum: NEGATIVE

## 2023-07-10 MED ORDER — CEPHALEXIN 500 MG PO CAPS: 500.0000 mg | ORAL_CAPSULE | Freq: Four times a day (QID) | ORAL | 0 refills | Status: DC

## 2023-07-10 MED ORDER — SODIUM CHLORIDE 0.9 % IV SOLN
1.0000 g | Freq: Once | INTRAVENOUS | Status: AC
  Administered 2023-07-10: 1 g via INTRAVENOUS
  Filled 2023-07-10: qty 10

## 2023-07-10 MED ORDER — KETOROLAC TROMETHAMINE 15 MG/ML IJ SOLN
15.0000 mg | Freq: Once | INTRAMUSCULAR | Status: AC
  Administered 2023-07-10: 15 mg via INTRAVENOUS
  Filled 2023-07-10: qty 1

## 2023-07-10 MED ORDER — OXYCODONE-ACETAMINOPHEN 5-325 MG PO TABS: 1.0000 | ORAL_TABLET | Freq: Three times a day (TID) | ORAL | 0 refills | Status: DC | PRN

## 2023-07-10 MED ORDER — SODIUM CHLORIDE 0.9 % IV BOLUS
1000.0000 mL | Freq: Once | INTRAVENOUS | Status: AC
  Administered 2023-07-10: 1000 mL via INTRAVENOUS

## 2023-07-10 MED ORDER — ONDANSETRON 4 MG PO TBDP
4.0000 mg | ORAL_TABLET | Freq: Once | ORAL | Status: AC | PRN
  Administered 2023-07-10: 4 mg via ORAL
  Filled 2023-07-10: qty 1

## 2023-07-10 MED ORDER — HYDROMORPHONE HCL 1 MG/ML IJ SOLN
0.5000 mg | Freq: Once | INTRAMUSCULAR | Status: AC
  Administered 2023-07-10: 0.5 mg via INTRAVENOUS
  Filled 2023-07-10: qty 1

## 2023-07-10 NOTE — ED Triage Notes (Signed)
 Pt POV d/t back pain for a week that now radiates to front and c/o chills, dysuria (frequency and pressure).  Pt has had N/V as well.

## 2023-07-10 NOTE — ED Provider Notes (Signed)
 [place] [facility] Provider Note   CSN: [id number] Arrival date & time: 07/10/23  1739     History  Chief Complaint  Patient presents with   Back Pain    Gloria Cox is a 29 y.o. female.   Back Pain Presents with back pain.  Also low abdominal pain.  Has had for around a week.  States started in the back which is not unusual for her but then came around the abdomen.  Has dysuria and some diarrhea.  Has had chills.  No vaginal bleeding or discharge.  States has watery diarrhea.     Home Medications Prior to Admission medications   Medication Sig Start Date End Date Taking? Authorizing Provider  albuterol (VENTOLIN HFA) 108 (90 Base) MCG/ACT inhaler Inhale 1-2 puffs into the lungs every 6 (six) hours as needed. 06/29/23   Mayer, Jodi R, NP  cephALEXin (KEFLEX) 500 MG capsule Take 1 capsule (500 mg total) by mouth 4 (four) times daily. 07/10/23   Mozell Arias, MD  etonogestrel (NEXPLANON) 68 MG IMPL implant Inject 1 implant by subcutaneous route. 11/07/14   [provider]  mometasone-formoterol (DULERA) 200-5 MCG/ACT AERO Inhale 2 puffs into the lungs in the morning and at bedtime. 02/16/23   Ernestina Headland, MD  ondansetron (ZOFRAN-ODT) 4 MG disintegrating tablet Take 1 tablet (4 mg total) by mouth every 8 (eight) hours as needed for nausea or vomiting. 05/10/23   Levora Reas A, NP  oxyCODONE-acetaminophen (PERCOCET/ROXICET) 5-325 MG tablet Take 1-2 tablets by mouth every 8 (eight) hours as needed for severe pain (pain score 7-10). 07/10/23   Mozell Arias, MD  promethazine-dextromethorphan (PROMETHAZINE-DM) 6.25-15 MG/5ML syrup Take 5 mLs by mouth 4 (four) times daily as needed for cough. 06/29/23   Mayer, Jodi R, NP  budesonide (PULMICORT) 180 MCG/ACT inhaler Inhale 2 puffs into the lungs as needed.  06/14/11  [provider]      Allergies    Patient has no known allergies.    Review of Systems   Review of  Systems  Musculoskeletal:  Positive for back pain.    Physical Exam Updated Vital Signs BP 116/69   Pulse 72   Temp 97.8 F (36.6 C)   Resp 18   Ht 5\' 3"  (1.6 m)   Wt 105.2 kg   LMP 05/27/2023 (Exact Date)   SpO2 100%   BMI 41.10 kg/m  Physical Exam Vitals and nursing note reviewed.  Cardiovascular:     Rate and Rhythm: Regular rhythm. Tachycardia present.  Pulmonary:     Breath sounds: No wheezing.  Abdominal:     Tenderness: There is abdominal tenderness.     Comments: Suprapubic tenderness rebound or guarding.  No hernia palpated.  Also left-sided abdominal tenderness without rebound or guarding.  Skin:    Capillary Refill: Capillary refill takes less than 2 seconds.  Neurological:     Mental Status: She is alert.     ED Results / Procedures / Treatments   Labs (all labs ordered are listed, but only abnormal results are displayed) Labs Reviewed  COMPREHENSIVE METABOLIC PANEL WITH GFR - Abnormal; Notable for the following components:      Result Value   Sodium 134 (*)    CO2 18 (*)    Glucose, Bld 103 (*)    All other components within normal limits  CBC - Abnormal; Notable for the following components:   WBC 11.1 (*)    RBC 5.67 (*)  Hemoglobin 15.6 (*)    HCT 47.6 (*)    All other components within normal limits  URINALYSIS, ROUTINE W REFLEX MICROSCOPIC - Abnormal; Notable for the following components:   Color, Urine AMBER (*)    APPearance CLOUDY (*)    Protein, ur 30 (*)    Leukocytes,Ua MODERATE (*)    Bacteria, UA RARE (*)    All other components within normal limits  RESP PANEL BY RT-PCR (RSV, FLU A&B, COVID)  RVPGX2  LIPASE, BLOOD  HCG, SERUM, QUALITATIVE    EKG None  Radiology CT Renal Stone Study Result Date: 07/10/2023 CLINICAL DATA:  Abdominal pain, flank pain EXAM: CT ABDOMEN AND PELVIS WITHOUT CONTRAST TECHNIQUE: Multidetector CT imaging of the abdomen and pelvis was performed following the standard protocol without IV contrast.  RADIATION DOSE REDUCTION: This exam was performed according to the departmental dose-optimization program which includes automated exposure control, adjustment of the mA and/or kV according to patient size and/or use of iterative reconstruction technique. COMPARISON:  10/11/2017 FINDINGS: Lower chest: No acute abnormality Hepatobiliary: No focal liver abnormality is seen. Status post cholecystectomy. No biliary dilatation. Pancreas: No focal abnormality or ductal dilatation. Spleen: No focal abnormality.  Normal size. Adrenals/Urinary Tract: Punctate nonobstructing 1 mm stone in the lower pole of the left kidney. No stones on the right. No ureteral stones or hydronephrosis bilaterally. Urinary bladder and adrenal glands unremarkable. Stomach/Bowel: Normal appendix. Stomach, large and small bowel grossly unremarkable. Vascular/Lymphatic: Aorta normal caliber. Small scattered retroperitoneal lymph nodes, none pathologically enlarged or change since prior study. Reproductive: Uterus and adnexa unremarkable.  No mass. Other: No free fluid or free air. Musculoskeletal: No acute bony abnormality. IMPRESSION: Punctate 1 mm nonobstructing left lower pole renal stone. No ureteral stones or hydronephrosis. No acute findings. Electronically Signed   By: Janeece Mechanic M.D.   On: 07/10/2023 19:38    Procedures Procedures    Medications Ordered in ED Medications  ondansetron (ZOFRAN-ODT) disintegrating tablet 4 mg (4 mg Oral Given 07/10/23 1755)  sodium chloride 0.9 % bolus 1,000 mL (0 mLs Intravenous Stopped 07/10/23 2055)  ketorolac (TORADOL) 15 MG/ML injection 15 mg (15 mg Intravenous Given 07/10/23 1946)  cefTRIAXone (ROCEPHIN) 1 g in sodium chloride 0.9 % 100 mL IVPB (0 g Intravenous Stopped 07/10/23 2055)  HYDROmorphone (DILAUDID) injection 0.5 mg (0.5 mg Intravenous Given 07/10/23 2023)    ED Course/ Medical Decision Making/ A&P                                 Medical Decision Making Amount and/or Complexity  of Data Reviewed Labs: ordered. Radiology: ordered.  Risk Prescription drug management.   Patient with dysuria diarrhea abdominal pain.  Sent for a week.  Chills.  Differential diagnosis includes cause such as UTI, pyelo-, kidney stones.  Will get urinalysis.  Will get basic blood work.  Will get CT scan scan to evaluate for stones since there is pain going up the left side.   Blood work overall reassuring.  Urine shows likely infection.  CT scan done and showed kidney stone but no ureteral stone.  No other clear cause of the pain.  Likely UTI.  Will treat with antibiotics.  Discharge home.         Final Clinical Impression(s) / ED Diagnoses Final diagnoses:  Acute cystitis without hematuria    Rx / DC Orders ED Discharge Orders  Ordered    cephALEXin (KEFLEX) 500 MG capsule  4 times daily,   Status:  Discontinued        07/10/23 2132    oxyCODONE-acetaminophen (PERCOCET/ROXICET) 5-325 MG tablet  Every 8 hours PRN,   Status:  Discontinued        07/10/23 2132    cephALEXin (KEFLEX) 500 MG capsule  4 times daily,   Status:  Discontinued        07/10/23 2134    cephALEXin (KEFLEX) 500 MG capsule  4 times daily        07/10/23 2138    oxyCODONE-acetaminophen (PERCOCET/ROXICET) 5-325 MG tablet  Every 8 hours PRN        07/10/23 2138              Mozell Arias, MD 07/10/23 2339

## 2023-07-10 NOTE — ED Notes (Signed)
 Patient transported to CT

## 2023-07-10 NOTE — ED Notes (Signed)
 Patient returned from CT

## 2023-09-07 ENCOUNTER — Encounter: Payer: Self-pay | Admitting: *Deleted

## 2023-09-10 ENCOUNTER — Encounter: Payer: Self-pay | Admitting: Student

## 2023-09-10 DIAGNOSIS — J4521 Mild intermittent asthma with (acute) exacerbation: Secondary | ICD-10-CM

## 2023-09-10 NOTE — Telephone Encounter (Signed)
 Called patient back as I have not heard back from her regarding appointment.   Patient reports that she was unable to find child care to be evaluated today.   She did administer albuterol  neb treatment which raised her O2 saturation level to 97%.   She is not having SHOB at this time and is speaking in complete sentences.   Patient also reports continued back pain, nausea and decreased appetite. Advised that we would need her to be seen in office to evaluate these concerns further.   Scheduled office visit for Monday morning with Dr. Bernardino Bridge.   ED precautions discussed.   Requesting albuterol  refill in the meantime.   Elsie Halo, RN

## 2023-09-10 NOTE — Telephone Encounter (Signed)
 Called patient regarding concern.   She states that she has a pulse ox that measured her oxygen saturation of 89 this morning. She reports recent illness with cough, runny nose and fever. Tmax 101.4  She also reports ongoing issues with kidneys since ED visit in April.   She states that sickness started about two weeks ago.   She reports that she feels that she becomes out of breath at times, more so with exertion/activity.   She is speaking in complete sentences at time of phone call. She states that she will go to the ED if her breathing were to worsen or O2 saturations were to continue to drop.   Advised patient that I would recommend evaluation today. 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 She states that she will call her mother to attempt to get childcare so that she can be evaluated.   Discussed importance of evaluation today.   Pt voices understanding.   Elsie Halo, RN

## 2023-09-12 MED ORDER — ALBUTEROL SULFATE HFA 108 (90 BASE) MCG/ACT IN AERS: 1.0000 | INHALATION_SPRAY | Freq: Four times a day (QID) | RESPIRATORY_TRACT | 0 refills | Status: DC | PRN

## 2023-09-13 ENCOUNTER — Ambulatory Visit: Payer: Self-pay | Admitting: Family Medicine

## 2023-09-13 ENCOUNTER — Ambulatory Visit (INDEPENDENT_AMBULATORY_CARE_PROVIDER_SITE_OTHER): Admitting: Family Medicine

## 2023-09-13 ENCOUNTER — Encounter: Payer: Self-pay | Admitting: Family Medicine

## 2023-09-13 VITALS — BP 108/74 | HR 77 | Temp 97.9°F | Ht 63.0 in | Wt 221.6 lb

## 2023-09-13 DIAGNOSIS — R300 Dysuria: Secondary | ICD-10-CM

## 2023-09-13 DIAGNOSIS — R109 Unspecified abdominal pain: Secondary | ICD-10-CM

## 2023-09-13 DIAGNOSIS — R110 Nausea: Secondary | ICD-10-CM | POA: Diagnosis not present

## 2023-09-13 LAB — POCT UA - MICROSCOPIC ONLY
Epithelial cells, urine per micros: >20
RBC, Urine, Miroscopic: NONE SEEN (ref 0–2)

## 2023-09-13 LAB — POCT URINE DIPSTICK
Bilirubin, UA: NEGATIVE
Blood, UA: NEGATIVE
Glucose, UA: NEGATIVE mg/dL
Ketones, POC UA: NEGATIVE mg/dL
Nitrite, UA: NEGATIVE
POC PROTEIN,UA: NEGATIVE
Spec Grav, UA: 1.025
Urobilinogen, UA: 0.2 U/dL
pH, UA: 5.5

## 2023-09-13 MED ORDER — ONDANSETRON 4 MG PO TBDP: 4.0000 mg | ORAL_TABLET | Freq: Three times a day (TID) | ORAL | 0 refills | Status: DC | PRN

## 2023-09-13 MED ORDER — ONDANSETRON 4 MG PO TBDP
4.0000 mg | ORAL_TABLET | Freq: Once | ORAL | Status: AC
  Administered 2023-09-13: 4 mg via ORAL

## 2023-09-13 MED ORDER — OXYCODONE-ACETAMINOPHEN 10-325 MG PO TABS: 1.0000 | ORAL_TABLET | Freq: Three times a day (TID) | ORAL | 0 refills | Status: AC | PRN

## 2023-09-13 NOTE — Progress Notes (Signed)
    SUBJECTIVE:   CHIEF COMPLAINT / HPI:   Shortness of breath Has Symbicort , Ventolin  inhalers which work well. Breathing today is good, does not feel like she is struggling to breathe. Does check with home pulse ox regularly.  Right Flank/Back/Abdominal Pain Constant R flank and back pain; intermittent R groin pain. Denies frank dysuria, but states she has seen blood in her urine. Subjective fever, chills, nausea, diarrhea, can't eat, couldn't sleep last night due to discomfort. Tylenol , Azo has not helped. This has been going on for 2+ weeks. ED in April for same, and completed course of Keflex  with resolution of symptoms.  PERTINENT  PMH / PSH: Reviewed. Asthma Tobacco use PPD S/p cholecystectomy  OBJECTIVE:   BP 108/74   Pulse 77   Temp 97.9 F (36.6 C)   Ht 5' 3 (1.6 m)   Wt 221 lb 9.6 oz (100.5 kg)   SpO2 100%   BMI 39.25 kg/m    General: Uncomfortable-appearing, no acute distress. Cardio: Regular rate, regular rhythm, no murmurs on exam. Pulm: Clear, no wheezing, no crackles. No increased work of breathing. Abdominal: bowel sounds present, soft, non-distended. Significant TTP in RUQ, RLQ, and suprapubic area. Moderate CVA tenderness on R.   ASSESSMENT/PLAN:   Assessment & Plan Dysuria Suspect UTI versus passing kidney stone seen on imaging in April. - UA and culture today, will send abx as appropriate - encouraged very good hydration - gave a few tabs of percocet to help with acute discomfort, especially in light of possibly passing stone - supportive care with tylenol /ibuprofen  Nausea - Zofran  PRN  No concerns with breathing today, pt will let us  know if she needs refills of inhalers.    Omar Bibber, DO [facility]

## 2023-09-13 NOTE — Addendum Note (Signed)
 Addended by: Omar Bibber on: 09/13/2023 11:36 AM   Modules accepted: Orders

## 2023-09-13 NOTE — Patient Instructions (Addendum)
 It was so good to see you today! Thank you for allowing me to take care of you.  Today we discussed the following concerns and plans:  Flank Pain - You may have a UTI, we will check your urine and I will send in antibiotics if needed. - It is also possible you are passing your kidney stone. - Continue to stay well hydrated and take Tylenol /Ibuprofen  as needed to help with the pain. I will send in more Zofran  for your nausea.  If you have any concerns, please call the clinic or schedule an appointment.  It was a pleasure to take care of you today. Be well!  Omar Bibber, DO [facility], PGY-1   Don't forget to check out the [facility] in the [facility] at [address] [phone] Affordable prices on prescriptions and over-the-counter items, as well as services like vaccinations and medication home delivery.

## 2023-09-15 LAB — URINE CULTURE

## 2023-09-27 ENCOUNTER — Encounter: Payer: Self-pay | Admitting: Family Medicine

## 2023-09-27 ENCOUNTER — Ambulatory Visit (INDEPENDENT_AMBULATORY_CARE_PROVIDER_SITE_OTHER): Admitting: Family Medicine

## 2023-09-27 ENCOUNTER — Ambulatory Visit: Payer: Self-pay | Admitting: Family Medicine

## 2023-09-27 VITALS — BP 129/92 | HR 87 | Ht 63.0 in | Wt 225.6 lb

## 2023-09-27 DIAGNOSIS — N926 Irregular menstruation, unspecified: Secondary | ICD-10-CM | POA: Diagnosis not present

## 2023-09-27 DIAGNOSIS — R109 Unspecified abdominal pain: Secondary | ICD-10-CM

## 2023-09-27 DIAGNOSIS — R197 Diarrhea, unspecified: Secondary | ICD-10-CM

## 2023-09-27 DIAGNOSIS — R1031 Right lower quadrant pain: Secondary | ICD-10-CM

## 2023-09-27 LAB — POCT URINALYSIS DIP (MANUAL ENTRY)
Bilirubin, UA: NEGATIVE
Glucose, UA: NEGATIVE mg/dL
Ketones, POC UA: NEGATIVE mg/dL
Leukocytes, UA: NEGATIVE
Nitrite, UA: NEGATIVE
Protein Ur, POC: NEGATIVE mg/dL
Spec Grav, UA: 1.02 (ref 1.010–1.025)
Urobilinogen, UA: 0.2 U/dL
pH, UA: 6.5 (ref 5.0–8.0)

## 2023-09-27 LAB — POCT UA - MICROSCOPIC ONLY
Bacteria, U Microscopic: NONE SEEN
RBC, Urine, Miroscopic: >20 (ref 0–2)
WBC, Ur, HPF, POC: NONE SEEN (ref 0–5)

## 2023-09-27 MED ORDER — ONDANSETRON 4 MG PO TBDP: 4.0000 mg | ORAL_TABLET | Freq: Three times a day (TID) | ORAL | 0 refills | Status: DC | PRN

## 2023-09-27 MED ORDER — OXYCODONE-ACETAMINOPHEN 10-325 MG PO TABS: 1.0000 | ORAL_TABLET | Freq: Three times a day (TID) | ORAL | 0 refills | Status: AC | PRN

## 2023-09-27 NOTE — Patient Instructions (Signed)
 It was so good to see you today! Thank you for allowing me to take care of you.  Today we discussed the following concerns and plans:  Abdominal pain - I have ordered a kidney ultrasound - we will check your urine today - Zofran  refill sent to pharmacy for nausea  If you have any concerns, please call the clinic or schedule an appointment.  It was a pleasure to take care of you today. Be well!  Lauraine Norse, DO [facility], PGY-1   Don't forget to check out the [facility] in the [facility] at [address] [phone] Affordable prices on prescriptions and over-the-counter items, as well as services like vaccinations and medication home delivery.

## 2023-09-27 NOTE — Progress Notes (Cosign Needed)
    SUBJECTIVE:   CHIEF COMPLAINT / HPI:   Stomach pain Persistent, similar to prior. Primarily RLQ. Still with nausea. Has a job where she does not have breaks and often has to hold her bladder.  Bilat lower back pain. Has frequent diarrhea - this has been ongoing since her abdominal pain started. Prior to this she did have normal regular BMs.  No fevers, chills, or other sick symptoms. No sick contacts.  Period started 2 weeks ago and is ongoing. Periods have been irregular since she got Nexplanon  (placed Aug 2024) - period was normal monthly when she did not have nexplanon .   Mother and Grandmother with IBS. She had severe kidney infection as a small child and so she is very anxious about longterm complications affecting her kidneys and causing her current symptoms.  PERTINENT  PMH / PSH: Reviewed. Asthma Tobacco use PPD S/p cholecystectomy  OBJECTIVE:   BP (!) 129/92   Pulse 87   Ht 5' 3 (1.6 m)   Wt 225 lb 9.6 oz (102.3 kg)   SpO2 100%   BMI 39.96 kg/m   General: Well-appearing, no acute distress. HEENT: normocephalic, MMM. Cardio: Regular rate, regular rhythm, no murmurs on exam. Pulm: Clear, no wheezing, no crackles. No increased work of breathing. Abdominal: bowel sounds present, soft, non-distended.  TTP in RLQ, but no guarding or rebound tenderness. No CVA tenderness bilaterally. Extremities: no peripheral edema. Moves all extremities equally. Neuro: Alert and oriented x3, speech normal in content.   ASSESSMENT/PLAN:   Assessment & Plan Flank pain This been ongoing for several months now; previously suspected related to passage of small kidney stone seen on imaging in the ED, however I would not anticipate that such a small stone would cause the amount of discomfort the patient has experienced.  -Renal ultrasound ordered; may consider CT renal stone study in the future, however I would like to spare the patient unnecessary radiation. -UA today to look for  blood in urine Diarrhea, unspecified type Family history of IBS, and given patient description of symptoms I suspect she may have IBS-D.  This, or another etiology such as Crohn's, could certainly be contributing to her abdominal pain. - Referral to GI for further workup as appropriate Irregular menses Irregular and heavy menses since Nexplanon  was placed 1 year ago; very possible that this is also contributing to abdominal pain picture.  Discussed birth control options with the patient today. - Patient is leaning towards having Nexplanon  removed; if she decides to do this in the future I will be happy to remove for her.     Lauraine Norse, DO [facility]

## 2023-10-05 ENCOUNTER — Ambulatory Visit (HOSPITAL_COMMUNITY)
Admission: RE | Admit: 2023-10-05 | Discharge: 2023-10-05 | Disposition: A | Discharge status: Home / Self Care | Source: Ambulatory Visit | Attending: Family Medicine | Admitting: Family Medicine

## 2023-10-05 DIAGNOSIS — R109 Unspecified abdominal pain: Secondary | ICD-10-CM | POA: Insufficient documentation

## 2023-10-07 ENCOUNTER — Encounter: Payer: Self-pay | Admitting: Family Medicine

## 2023-10-14 ENCOUNTER — Encounter: Payer: Self-pay | Admitting: Family Medicine

## 2023-10-15 ENCOUNTER — Encounter: Payer: Self-pay | Admitting: Gastroenterology

## 2023-10-19 ENCOUNTER — Ambulatory Visit (INDEPENDENT_AMBULATORY_CARE_PROVIDER_SITE_OTHER): Admitting: Family Medicine

## 2023-10-19 VITALS — BP 122/75 | HR 85 | Ht 62.0 in | Wt 219.6 lb

## 2023-10-19 DIAGNOSIS — J45909 Unspecified asthma, uncomplicated: Secondary | ICD-10-CM

## 2023-10-19 DIAGNOSIS — J4521 Mild intermittent asthma with (acute) exacerbation: Secondary | ICD-10-CM

## 2023-10-19 DIAGNOSIS — J45901 Unspecified asthma with (acute) exacerbation: Secondary | ICD-10-CM

## 2023-10-19 DIAGNOSIS — N3010 Interstitial cystitis (chronic) without hematuria: Secondary | ICD-10-CM | POA: Diagnosis not present

## 2023-10-19 DIAGNOSIS — M6289 Other specified disorders of muscle: Secondary | ICD-10-CM

## 2023-10-19 MED ORDER — BUDESONIDE-FORMOTEROL FUMARATE 80-4.5 MCG/ACT IN AERO: 2.0000 | INHALATION_SPRAY | Freq: Two times a day (BID) | RESPIRATORY_TRACT | 3 refills | Status: DC

## 2023-10-19 MED ORDER — IPRATROPIUM-ALBUTEROL 0.5-2.5 (3) MG/3ML IN SOLN: 3.0000 mL | RESPIRATORY_TRACT | Status: DC

## 2023-10-19 MED ORDER — ALBUTEROL SULFATE HFA 108 (90 BASE) MCG/ACT IN AERS: 1.0000 | INHALATION_SPRAY | Freq: Four times a day (QID) | RESPIRATORY_TRACT | 3 refills | Status: AC | PRN

## 2023-10-19 MED ORDER — NICOTINE 14 MG/24HR TD PT24: 14.0000 mg | MEDICATED_PATCH | Freq: Every day | TRANSDERMAL | 1 refills | Status: AC

## 2023-10-19 MED ORDER — IPRATROPIUM BROMIDE 0.02 % IN SOLN
0.5000 mg | Freq: Once | RESPIRATORY_TRACT | Status: AC
  Administered 2023-10-19: 0.5 mg via RESPIRATORY_TRACT

## 2023-10-19 MED ORDER — ALBUTEROL SULFATE (2.5 MG/3ML) 0.083% IN NEBU
2.5000 mg | INHALATION_SOLUTION | Freq: Once | RESPIRATORY_TRACT | Status: AC
  Administered 2023-10-19: 2.5 mg via RESPIRATORY_TRACT

## 2023-10-19 MED ORDER — PREDNISONE 20 MG PO TABS: 40.0000 mg | ORAL_TABLET | Freq: Every day | ORAL | 0 refills | Status: DC

## 2023-10-19 NOTE — Patient Instructions (Addendum)
 It was so good to see you today! Thank you for allowing me to take care of you.  Today we discussed the following concerns and plans:  Asthma exacerbation - you got a duoneb in the office today and your breathing improved - take Prednisone  40 mg every day for 5 days - change your inhaler use: continue using Symbicort  2 puffs twice a day. You should also use Symbicort  for breakthrough symptoms (1-2 puffs as needed up to 12 extra puffs daily). - do not use ventolin  (albuterol ) inhaler with the Symbicort  - continue to avoid vapes and cigarettes; I have prescribed nicotine  patches to help - Dr. Koval will contact you to make an appointment for smoking cessation  Pelvic pain - continue to avoid bladder irritants - urinate at least once every 2-3 hours - drink plenty of water - your survey results do suggest a condition called Interstitial Cystitis, or bladder irritation. This could also be complicated by Pelvis Floor Dysfunction. - I will refer you to Urogynecology and Pelvic Floor PT   If you have any concerns, please call the clinic or schedule an appointment.  It was a pleasure to take care of you today. Be well!  Lauraine Norse, DO [facility], PGY-2  Do you need your medications delivered to your home?   We can send your prescription to the [facility] for delivery.          Address: [address] [place], [place], [place] [address]          Phone: [phone]  Please call the [facility] to speak with a pharmacist and set up your home medication delivery. If you have any questions, feel free to contact us  -- we're happy to help!  Other [facility]acies that offer affordable prices on both prescriptions and over-the-counter items, as well as convenient services like vaccinations, are  [facility], at [facility]         Address:  [address], [place], [place] [address]         Phone:  [phone]  [facility], located in the [facility]        Address: [address] [place], [place] [address]        Phone: [phone]  [facility], at [facility]       Address: [address], [place], [place] [address]       Phone: [phone]  [facility], at [facility]       Address: [address], [place], [place] [address]       Phone: [phone]

## 2023-10-19 NOTE — Progress Notes (Addendum)
    SUBJECTIVE:   CHIEF COMPLAINT / HPI:   Coughing Hx Asthma Currently taking ventolin  inhaler every 6 hours as needed, Symbicort  twice daily. Wheezing, coughing for last 2-3 days. Nonproductive. Trouble sleeping at night. Pressure in head. No recent illnesses, subject fevers with feeling hot and then cold chills but when she checked temp it was 99.0. Has home pulse ox which has shown high 80s to low 90s. She does use a vape with nicotine  frequently throughout the day. - helps with anxiety; wants to start meds for this in future but not today - tearful over the idea of quitting, she is very stressed, however she wants to quit for her kids - wants to try nicotine  patch and would like smoking cessation counseling  Pain In right side/flank, lower back. Flare ups.  Pain with sex, urination. Stands for 9 hour shifts at work and cannot frequently use the bathroom. 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  Had to have epidural inserted 3 times when delivering second child last fall - feels like this contributes to her back pain. She reports stalled labor with this child resulting in c section. She feels that all of this is contributing. Also has pain with intercourse.  PERTINENT  PMH / PSH: Reviewed. Asthma Tobacco use PPD S/p cholecystectomy S/p 2 cesarean sections  OBJECTIVE:   BP 122/75   Pulse 85   Ht 5' 2 (1.575 m)   Wt 219 lb 9.6 oz (99.6 kg)   SpO2 97%   BMI 40.17 kg/m   General: well-appearing, no acute distress. HEENT: normocephalic, PERRLA, MMM Cardio: Regular rate, regular rhythm, no murmurs on exam. Pulm: Diffuse wheezing throughout all lung fields, no consolidations. Abdominal: bowel sounds present, soft, mildly tender over bladder, non-distended. Neuro: Alert and oriented x3, speech normal in content, no facial asymmetry Psych:  Cognition and judgment appear intact. Somewhat tearful when discussing smoking  cessation and stress.    ASSESSMENT/PLAN:   Assessment & Plan Mild intermittent asthma with acute exacerbation Wheezing, unrelenting cough, SOB with ambulation and talking, decreased response to PRN ventolin  - pt does appear to be in exacerbation. - DuoNeb here in clinic with improvement in wheezing, symptoms - initiate SMART therapy with Symbicort  2 puffs BID, as well as Symbicort  1-2 puffs PRN for breakthrough symptoms (not to exceed 12 puffs daily); do not need to use ventolin /albuterol  PRN in combo with this regimen. - prednisone  40 mg daily x 5 days - encouraged smoking cessation; prescribed nicotine  patch at pt request and recommend appt with Dr. Koval for smoking cessation - strict return precautions given Interstitial cystitis Scored very high (26) on PUF questionnaire indicating strong likelihood of IC. - referral to Urogynecology - discussed dietary irritants and provided handout with more info - may consider empiric Pentosan in future, though this does carry ophthalmic risks and patient would need thorough ocular history and eye exam after 6 months.  Pelvic floor dysfunction in female Suspect component of PFD given recent birth history and symptom description, particularly dyspareunia.  - referral to pelvic floor rehab    Lauraine Norse, DO [facility]

## 2023-10-20 ENCOUNTER — Telehealth: Payer: Self-pay | Admitting: Pharmacist

## 2023-10-20 NOTE — Telephone Encounter (Signed)
 Patient contacted for follow/up of tobacco intake reduction / cessation attempt.   Patient seen by Dr. Lafe yesterday.  Since last contact patient reports her breathing continues to be problematic including a visit to the ER last night.   She shared that she has been vaping ~ every 10 minutes over the last several weeks.  She also reports smoking ~ 4-5 cigarettes per day over the last several days.   We agreed on an appointment in 5 days on Monday when she is most available for face-to-face appointment.   Total time with patient call and documentation of interaction: 11 minutes.

## 2023-10-20 NOTE — Telephone Encounter (Signed)
 Reviewed and agree with Dr Macky Lower plan.

## 2023-10-20 NOTE — Telephone Encounter (Signed)
-----   Message from Lauraine Norse sent at 10/19/2023 10:55 AM EDT ----- Regarding: Smoking cessation This pt presented today with asthma exacerbation and is interested in smoking cessation help. She would like to see you when you are available.  Thanks! Lauraine

## 2023-10-25 ENCOUNTER — Ambulatory Visit: Admitting: Pharmacist

## 2023-10-25 ENCOUNTER — Encounter: Payer: Self-pay | Admitting: Pharmacist

## 2023-11-01 ENCOUNTER — Encounter (HOSPITAL_COMMUNITY): Payer: Self-pay

## 2023-11-01 ENCOUNTER — Ambulatory Visit (INDEPENDENT_AMBULATORY_CARE_PROVIDER_SITE_OTHER)

## 2023-11-01 ENCOUNTER — Other Ambulatory Visit: Payer: Self-pay

## 2023-11-01 ENCOUNTER — Ambulatory Visit (HOSPITAL_COMMUNITY)
Admission: RE | Admit: 2023-11-01 | Discharge: 2023-11-01 | Disposition: A | Discharge status: Home / Self Care | Payer: Self-pay | Source: Ambulatory Visit | Attending: Physician Assistant | Admitting: Physician Assistant

## 2023-11-01 VITALS — BP 112/74 | HR 85 | Temp 98.4°F | Resp 22

## 2023-11-01 DIAGNOSIS — J039 Acute tonsillitis, unspecified: Secondary | ICD-10-CM

## 2023-11-01 DIAGNOSIS — Z8701 Personal history of pneumonia (recurrent): Secondary | ICD-10-CM | POA: Insufficient documentation

## 2023-11-01 DIAGNOSIS — R509 Fever, unspecified: Secondary | ICD-10-CM

## 2023-11-01 DIAGNOSIS — J358 Other chronic diseases of tonsils and adenoids: Secondary | ICD-10-CM | POA: Insufficient documentation

## 2023-11-01 DIAGNOSIS — R051 Acute cough: Secondary | ICD-10-CM | POA: Insufficient documentation

## 2023-11-01 DIAGNOSIS — B370 Candidal stomatitis: Secondary | ICD-10-CM | POA: Insufficient documentation

## 2023-11-01 DIAGNOSIS — J4521 Mild intermittent asthma with (acute) exacerbation: Secondary | ICD-10-CM | POA: Diagnosis not present

## 2023-11-01 LAB — POCT RAPID STREP A (OFFICE): Rapid Strep A Screen: NEGATIVE

## 2023-11-01 LAB — POCT MONO SCREEN (KUC): Mono, POC: NEGATIVE

## 2023-11-01 MED ORDER — BUDESONIDE-FORMOTEROL FUMARATE 80-4.5 MCG/ACT IN AERO: 2.0000 | INHALATION_SPRAY | Freq: Two times a day (BID) | RESPIRATORY_TRACT | 0 refills | Status: AC

## 2023-11-01 MED ORDER — NYSTATIN 100000 UNIT/ML MT SUSP: 500000.0000 [IU] | Freq: Four times a day (QID) | OROMUCOSAL | 0 refills | Status: DC

## 2023-11-01 MED ORDER — PREDNISONE 10 MG (21) PO TBPK: ORAL_TABLET | ORAL | 0 refills | Status: DC

## 2023-11-01 MED ORDER — PROMETHAZINE-DM 6.25-15 MG/5ML PO SYRP: 5.0000 mL | ORAL_SOLUTION | Freq: Two times a day (BID) | ORAL | 0 refills | Status: DC | PRN

## 2023-11-01 NOTE — Discharge Instructions (Signed)
 Your x-ray did not show any evidence of pneumonia which is great news.  I am concerned that you are having an asthma exacerbation given your increased use of your asthma medication.  Continue albuterol  as needed.  Start prednisone  taper as prescribed.  Do not take NSAIDs with this medication including aspirin , ibuprofen /Advil , naproxen /Aleve .  Your strep testing and mono testing were negative in clinic.  I am wondering if the patches on your throat are more related to yeast.  Gargle and spit nystatin  4 times daily.  When you use your Symbicort  please make sure that you gargle with warm salt water and spit this out to try to prevent thrush.  If your culture is positive we will contact you and start antibiotics.  Use Promethazine  DM for cough.  This will make you sleepy so do not drive or drink alcohol while taking it.  If you are not feeling significantly better within a few days please return.  If anything worsens and you have high fever, worsening cough, shortness of breath despite the medication, trouble swallowing, swelling of your throat, muffled voice you should be seen immediately.

## 2023-11-01 NOTE — ED Notes (Signed)
 Had pneumonitis diagnosis from a visit at [facility] on 10/19/2023

## 2023-11-01 NOTE — ED Provider Notes (Signed)
 [facility]    CSN: [id number] Arrival date & time: 11/01/23  1319      History   Chief Complaint Chief Complaint  Patient presents with   Appointment    1330   Cough    HPI Gloria Cox is a 28 y.o. female.   Patient presents today with a weeklong history of URI symptoms that have worsened significantly in the past 2 days.  She reports cough, wheezing, fever, cold chills, sore throat with associated white patches.  She does have a history of asthma and has been treated recently for asthma exacerbation, pneumonitis, pneumonia and reports her last antibiotic use was approximately 3 weeks ago and she was given steroids about 2 weeks ago.  She reports frequent wheezing and has been using her rescue medication more frequently since her symptoms began.  She denies history of COPD but does use tobacco on a daily basis approximately 4 to 6 cigarettes daily.  She denies history of diabetes.  She has no concern for pregnancy.  She has been hospitalized several times for pneumonia and is concerned because she is continuing to have fevers with Tmax of 101.4 F as well as productive cough.  She has been using her inhalers including albuterol  and Symbicort  but these have not provided any relief of symptoms.    Past Medical History:  Diagnosis Date   Asthma    Dyspepsia    Gestational diabetes    Goiter    History of asthma 02/16/2021   Hyperlipemia 05/09/2013   Hypothyroidism    Macromastia 02/20/2014   Migraine without aura and without status migrainosus, not intractable 02/14/2015   [place]    Pre-diabetes    Status post repeat low transverse cesarean section 09/06/2021   Striae    Thyroiditis, autoimmune     Patient Active Problem List   Diagnosis Date Noted   Nexplanon  in place 02/18/2023   Gestational diabetes mellitus (GDM) in third trimester controlled on oral hypoglycemic drug 10/28/2022   Tobacco use 12/17/2021   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    Past Surgical History:  Procedure Laterality Date   CESAREAN SECTION N/A 09/06/2021   Procedure: CESAREAN SECTION;  Surgeon: Izell Harari, MD;  Location: [facility];  Service: Obstetrics;  Laterality: N/A;   CESAREAN SECTION N/A 10/29/2022   Procedure: CESAREAN SECTION;  Surgeon: Eldonna Suzen Octave, MD;  Location: [facility];  Service: Obstetrics;  Laterality: N/A;   CHOLECYSTECTOMY N/A 12/18/2015   Procedure: LAPAROSCOPIC CHOLECYSTECTOMY;  Surgeon: Donnice Bury, MD;  Location: [facility];  Service: General;  Laterality: N/A;   WISDOM TOOTH EXTRACTION  03/30/2012    OB History     Gravida  2   Para  2   Term  2   Preterm      AB      Living  2      SAB      IAB      Ectopic      Multiple  0   Live Births  2            Home Medications    Prior to Admission medications   Medication Sig Start Date End Date Taking? Authorizing Provider  nystatin  (MYCOSTATIN ) 100000 UNIT/ML suspension Take 5 mLs (500,000 Units total) by mouth 4 (four) times daily. Gargle and spit 11/01/23  Yes Lavontay Kirk, Rocky POUR, PA-C  predniSONE  (STERAPRED UNI-PAK 21 TAB) 10 MG (21) TBPK tablet As directed 11/01/23  Yes  Channon Ambrosini K, PA-C  promethazine -dextromethorphan  (PROMETHAZINE -DM) 6.25-15 MG/5ML syrup Take 5 mLs by mouth 2 (two) times daily as needed for cough. 11/01/23  Yes Christina Waldrop K, PA-C  albuterol  (VENTOLIN  HFA) 108 (90 Base) MCG/ACT inhaler Inhale 1-2 puffs into the lungs every 6 (six) hours as needed. 10/19/23   Lafe Domino, DO  budesonide -formoterol  (SYMBICORT ) 80-4.5 MCG/ACT inhaler Inhale 2 puffs into the lungs 2 (two) times daily. 11/01/23   Vander Kueker K, PA-C  etonogestrel  (NEXPLANON ) 68 MG IMPL implant Inject 1 implant by subcutaneous route. 11/07/14   [provider]  nicotine  (NICODERM CQ  - DOSED IN MG/24 HOURS) 14 mg/24hr patch Place 1 patch (14 mg total) onto the skin daily. 10/19/23   Lafe Domino, DO  ondansetron  (ZOFRAN -ODT) 4 MG disintegrating  tablet Take 1 tablet (4 mg total) by mouth every 8 (eight) hours as needed for nausea or vomiting. 09/27/23   Lafe Domino, DO  budesonide  (PULMICORT ) 180 MCG/ACT inhaler Inhale 2 puffs into the lungs as needed.  06/14/11  [provider]    Family History Family History  Problem Relation Age of Onset   Obesity Mother    Hypertension Father    Stroke Father    Obesity Sister    Obesity Brother    Diabetes Maternal Grandmother    Hypertension Maternal Grandmother    Obesity Maternal Grandmother    Diabetes Maternal Grandfather    Hypertension Maternal Grandfather    Heart Problems Maternal Grandfather 2   Diabetes Paternal Grandmother    Hypertension Paternal Grandmother    Diabetes Paternal Grandfather    Hypertension Paternal Grandfather     Social History Social History   Tobacco Use   Smoking status: Former    Current packs/day: 0.00    Types: Cigarettes    Quit date: 03/09/2018    Years since quitting: 5.6   Smokeless tobacco: Never  Vaping Use   Vaping status: Former   Quit date: 01/11/2022   Substances: Nicotine   Substance Use Topics   Alcohol use: Not Currently    Comment: not since confirmed pregnancy   Drug use: Not Currently    Types: Marijuana    Comment: none for years     Allergies   Patient has no known allergies.   Review of Systems Review of Systems  Constitutional:  Positive for activity change, chills, fatigue and fever. Negative for appetite change.  HENT:  Positive for congestion and sore throat. Negative for sinus pressure and sneezing.   Respiratory:  Positive for cough and shortness of breath.   Cardiovascular:  Negative for chest pain.  Gastrointestinal:  Negative for abdominal pain, diarrhea, nausea and vomiting.  Neurological:  Negative for dizziness, light-headedness and headaches.     Physical Exam Triage Vital Signs ED Triage Vitals  Encounter Vitals Group     BP 11/01/23 1411 112/74     Girls Systolic BP  Percentile --      Girls Diastolic BP Percentile --      Boys Systolic BP Percentile --      Boys Diastolic BP Percentile --      Pulse Rate 11/01/23 1411 85     Resp 11/01/23 1411 (!) 22     Temp 11/01/23 1411 98.4 F (36.9 C)     Temp Source 11/01/23 1411 Oral     SpO2 11/01/23 1411 96 %     Weight --      Height --      Head Circumference --  Peak Flow --      Pain Score 11/01/23 1406 7     Pain Loc --      Pain Education --      Exclude from Growth Chart --    No data found.  Updated Vital Signs BP 112/74 (BP Location: Right Arm)   Pulse 85   Temp 98.4 F (36.9 C) (Oral)   Resp (!) 22   LMP 10/01/2023   SpO2 96%   Visual Acuity Right Eye Distance:   Left Eye Distance:   Bilateral Distance:    Right Eye Near:   Left Eye Near:    Bilateral Near:     Physical Exam Vitals reviewed.  Constitutional:      General: She is awake. She is not in acute distress.    Appearance: Normal appearance. She is well-developed. She is not ill-appearing.     Comments: Very pleasant female appears stated age in no acute distress sitting comfortably in exam room  HENT:     Head: Normocephalic and atraumatic.     Right Ear: Tympanic membrane, ear canal and external ear normal. Tympanic membrane is not erythematous or bulging.     Left Ear: Tympanic membrane, ear canal and external ear normal. Tympanic membrane is not erythematous or bulging.     Nose:     Right Sinus: No maxillary sinus tenderness or frontal sinus tenderness.     Left Sinus: No maxillary sinus tenderness or frontal sinus tenderness.     Mouth/Throat:     Pharynx: Uvula midline. Posterior oropharyngeal erythema present. No oropharyngeal exudate.     Tonsils: Tonsillar exudate present. No tonsillar abscesses. 2+ on the right. 2+ on the left.  Cardiovascular:     Rate and Rhythm: Normal rate and regular rhythm.     Heart sounds: Normal heart sounds, S1 normal and S2 normal. No murmur heard. Pulmonary:      Effort: Pulmonary effort is normal.     Breath sounds: Normal breath sounds. No wheezing, rhonchi or rales.     Comments: Clear to auscultation bilaterally Psychiatric:        Behavior: Behavior is cooperative.      UC Treatments / Results  Labs (all labs ordered are listed, but only abnormal results are displayed) Labs Reviewed  CULTURE, GROUP A STREP [facility])  POCT RAPID STREP A (OFFICE)  POCT MONO SCREEN [facility])    EKG   Radiology DG Chest 2 View Result Date: 11/01/2023 CLINICAL DATA:  Throat pain and swelling.  Cough. EXAM: CHEST - 2 VIEW COMPARISON:  02/13/2023. FINDINGS: Trachea is midline. Heart size normal. Lungs are clear. No pleural fluid. IMPRESSION: No acute findings. Electronically Signed   By: Newell Eke M.D.   On: 11/01/2023 15:22    Procedures Procedures (including critical care time)  Medications Ordered in UC Medications - No data to display  Initial Impression / Assessment and Plan / UC Course  I have reviewed the triage vital signs and the nursing notes.  Pertinent labs & imaging results that were available during my care of the patient were reviewed by me and considered in my medical decision making (see chart for details).     Patient is well-appearing, afebrile, nontoxic, nontachycardic.  I suspect her symptoms are viral in nature and have triggered an asthma exacerbation.  She was provided a refill of her Symbicort  and encouraged to continue using this as prescribed as well as her albuterol  as needed.  Will start prednisone  taper and we  discussed that she is not to take NSAIDs with this medication due to risk of GI bleeding but can use Tylenol , Mucinex, Flonase for additional symptom relief.  She was given Promethazine  DM for cough and we discussed that this can be sedating and she is not to drive or drink alcohol while taking it.  She does have tonsillar exudate but tested negative for strep and mono in clinic.  Will send this for culture but I suspect  this might be related to oral candidiasis related to inhaled corticosteroid use.  She was encouraged to rinse her mouth following use of this medication and will start nystatin  4 times daily for the next week.  Will defer additional antibiotics for the time being but we discussed that if her symptoms or not improving quickly she should return for reevaluation at which point we would consider additional testing and/or initiation of oral antibiotics.  Recommend close follow-up with her primary care.  Strict return precautions given.  All questions answered to patient satisfaction.  She was provided a work excuse note.  Final Clinical Impressions(s) / UC Diagnoses   Final diagnoses:  Acute cough  Fever, unspecified  History of pneumonia  Tonsillar exudate  Mild intermittent asthma with acute exacerbation  Oral thrush     Discharge Instructions      Your x-ray did not show any evidence of pneumonia which is great news.  I am concerned that you are having an asthma exacerbation given your increased use of your asthma medication.  Continue albuterol  as needed.  Start prednisone  taper as prescribed.  Do not take NSAIDs with this medication including aspirin , ibuprofen /Advil , naproxen /Aleve .  Your strep testing and mono testing were negative in clinic.  I am wondering if the patches on your throat are more related to yeast.  Gargle and spit nystatin  4 times daily.  When you use your Symbicort  please make sure that you gargle with warm salt water and spit this out to try to prevent thrush.  If your culture is positive we will contact you and start antibiotics.  Use Promethazine  DM for cough.  This will make you sleepy so do not drive or drink alcohol while taking it.  If you are not feeling significantly better within a few days please return.  If anything worsens and you have high fever, worsening cough, shortness of breath despite the medication, trouble swallowing, swelling of your throat, muffled voice  you should be seen immediately.     ED Prescriptions     Medication Sig Dispense Auth. Provider   budesonide -formoterol  (SYMBICORT ) 80-4.5 MCG/ACT inhaler Inhale 2 puffs into the lungs 2 (two) times daily. 1 each Jamien Casanova K, PA-C   predniSONE  (STERAPRED UNI-PAK 21 TAB) 10 MG (21) TBPK tablet As directed 21 tablet Kalleigh Harbor K, PA-C   promethazine -dextromethorphan  (PROMETHAZINE -DM) 6.25-15 MG/5ML syrup Take 5 mLs by mouth 2 (two) times daily as needed for cough. 50 mL Millie Forde K, PA-C   nystatin  (MYCOSTATIN ) 100000 UNIT/ML suspension Take 5 mLs (500,000 Units total) by mouth 4 (four) times daily. Gargle and spit 60 mL Presley Gora K, PA-C      PDMP not reviewed this encounter.   Sherrell Rocky POUR, PA-C 11/01/23 1544

## 2023-11-01 NOTE — ED Triage Notes (Addendum)
 Complains of throat pain and swelling.  Reports a cough and wheezing Cold chills  Symptoms started 2 days ago.  Has taken tamiflu  with no improvement.  Cough is worse at night.  Reports a fever of 101.4.  complains of tickle in throat and cold chills.  Patient has no energy.  Has been drinking hot tea with lemon.  Congestion in head and chest have worsen.  Reports coughing up green phlegm.  Patient is a smoker

## 2023-11-04 ENCOUNTER — Ambulatory Visit: Payer: Self-pay

## 2023-11-04 LAB — CULTURE, GROUP A STREP (THRC)

## 2023-11-05 ENCOUNTER — Ambulatory Visit (HOSPITAL_COMMUNITY)
Admission: RE | Admit: 2023-11-05 | Discharge: 2023-11-05 | Disposition: A | Discharge status: Home / Self Care | Payer: Self-pay | Source: Ambulatory Visit | Attending: Emergency Medicine | Admitting: Emergency Medicine

## 2023-11-05 VITALS — BP 116/78 | HR 67 | Temp 98.0°F | Resp 18

## 2023-11-05 DIAGNOSIS — U071 COVID-19: Secondary | ICD-10-CM | POA: Diagnosis not present

## 2023-11-05 LAB — POC SARS CORONAVIRUS 2 AG -  ED: SARS Coronavirus 2 Ag: POSITIVE — AB

## 2023-11-05 MED ORDER — PROMETHAZINE-DM 6.25-15 MG/5ML PO SYRP: 5.0000 mL | ORAL_SOLUTION | Freq: Four times a day (QID) | ORAL | 0 refills | Status: DC | PRN

## 2023-11-05 NOTE — Discharge Instructions (Signed)
 Continue your albuterol  treatments 3 times daily You can rinse your mouth out with water after each treatment  Continue and finish your prednisone   The promethazine  DM cough syrup can be used up to 4 times daily. If this medication makes you drowsy, take only once before bed.  Allow 4-5 more days for improvement in symptoms  Continue hydration and rest

## 2023-11-05 NOTE — ED Triage Notes (Signed)
 Patient presents to the office for cough and congestion. Patient tested positive for strep B on 11/04/2023. Patient did a home test for Covid and tested positive. Patient would like to be tested.  Home Intervention: Prednisone  and Albuterol  inhaler.

## 2023-11-05 NOTE — ED Provider Notes (Signed)
 [facility]    CSN: [id number] Arrival date & time: 11/05/23  1137      History   Chief Complaint Chief Complaint  Patient presents with   Cough    Got seen earlier this week. Still have a bad cough as well as sore throat. Did a at home Covid test and it was positive Hot sweats. Fever. Body aches. - Entered by patient    HPI Gloria Cox is a 28 y.o. female.  Here for congestion, cough, body aches She was here 4 days ago for sore throat, and was starting to have some cough and wheezing at that time.  Was treated for asthma with prednisone  and albuterol , promethazine  DM  Rapid strep was negative at her visit, but yesterday culture resulted with few group B. She is no longer having sore throat.  She took a home covid test 3 days ago that was positive. Wants to confirm     Past Medical History:  Diagnosis Date   Asthma    Dyspepsia    Gestational diabetes    Goiter    History of asthma 02/16/2021   Hyperlipemia 05/09/2013   Hypothyroidism    Macromastia 02/20/2014   Migraine without aura and without status migrainosus, not intractable 02/14/2015   [place]    Pre-diabetes    Status post repeat low transverse cesarean section 09/06/2021   Striae    Thyroiditis, autoimmune     Patient Active Problem List   Diagnosis Date Noted   Nexplanon  in place 02/18/2023   Gestational diabetes mellitus (GDM) in third trimester controlled on oral hypoglycemic drug 10/28/2022   Tobacco use 12/17/2021   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    Past Surgical History:  Procedure Laterality Date   CESAREAN SECTION N/A 09/06/2021   Procedure: CESAREAN SECTION;  Surgeon: Izell Harari, MD;  Location: [facility];  Service: Obstetrics;  Laterality: N/A;   CESAREAN SECTION N/A 10/29/2022   Procedure: CESAREAN SECTION;  Surgeon: Eldonna Suzen Octave, MD;  Location: [facility];  Service: Obstetrics;  Laterality: N/A;   CHOLECYSTECTOMY N/A 12/18/2015    Procedure: LAPAROSCOPIC CHOLECYSTECTOMY;  Surgeon: Donnice Bury, MD;  Location: [facility];  Service: General;  Laterality: N/A;   WISDOM TOOTH EXTRACTION  03/30/2012    OB History     Gravida  2   Para  2   Term  2   Preterm      AB      Living  2      SAB      IAB      Ectopic      Multiple  0   Live Births  2            Home Medications    Prior to Admission medications   Medication Sig Start Date End Date Taking? Authorizing Provider  albuterol  (VENTOLIN  HFA) 108 (90 Base) MCG/ACT inhaler Inhale 1-2 puffs into the lungs every 6 (six) hours as needed. 10/19/23  Yes Lafe Domino, DO  budesonide -formoterol  (SYMBICORT ) 80-4.5 MCG/ACT inhaler Inhale 2 puffs into the lungs 2 (two) times daily. 11/01/23  Yes Raspet, Erin K, PA-C  etonogestrel  (NEXPLANON ) 68 MG IMPL implant Inject 1 implant by subcutaneous route. 11/07/14  Yes [provider]  predniSONE  (STERAPRED UNI-PAK 21 TAB) 10 MG (21) TBPK tablet As directed 11/01/23  Yes Raspet, Erin K, PA-C  promethazine -dextromethorphan  (PROMETHAZINE -DM) 6.25-15 MG/5ML syrup Take 5 mLs by mouth 4 (four) times daily as needed for cough.  11/05/23  Yes Boni Maclellan, [place], PA-C  nicotine  (NICODERM CQ  - DOSED IN MG/24 HOURS) 14 mg/24hr patch Place 1 patch (14 mg total) onto the skin daily. 10/19/23   Lafe Domino, DO  nystatin  (MYCOSTATIN ) 100000 UNIT/ML suspension Take 5 mLs (500,000 Units total) by mouth 4 (four) times daily. Gargle and spit 11/01/23   Raspet, Erin K, PA-C  ondansetron  (ZOFRAN -ODT) 4 MG disintegrating tablet Take 1 tablet (4 mg total) by mouth every 8 (eight) hours as needed for nausea or vomiting. 09/27/23   Lafe Domino, DO  budesonide  (PULMICORT ) 180 MCG/ACT inhaler Inhale 2 puffs into the lungs as needed.  06/14/11  [provider]    Family History Family History  Problem Relation Age of Onset   Obesity Mother    Hypertension Father    Stroke Father    Obesity Sister    Obesity Brother     Diabetes Maternal Grandmother    Hypertension Maternal Grandmother    Obesity Maternal Grandmother    Diabetes Maternal Grandfather    Hypertension Maternal Grandfather    Heart Problems Maternal Grandfather 34   Diabetes Paternal Grandmother    Hypertension Paternal Grandmother    Diabetes Paternal Grandfather    Hypertension Paternal Grandfather     Social History Social History   Tobacco Use   Smoking status: Former    Current packs/day: 0.00    Types: Cigarettes    Quit date: 03/09/2018    Years since quitting: 5.6   Smokeless tobacco: Never  Vaping Use   Vaping status: Former   Quit date: 01/11/2022   Substances: Nicotine   Substance Use Topics   Alcohol use: Not Currently    Comment: not since confirmed pregnancy   Drug use: Not Currently    Types: Marijuana    Comment: none for years     Allergies   Patient has no known allergies.   Review of Systems Review of Systems As per HPI  Physical Exam Triage Vital Signs ED Triage Vitals  Encounter Vitals Group     BP 11/05/23 1215 116/78     Girls Systolic BP Percentile --      Girls Diastolic BP Percentile --      Boys Systolic BP Percentile --      Boys Diastolic BP Percentile --      Pulse Rate 11/05/23 1208 67     Resp 11/05/23 1208 18     Temp 11/05/23 1208 98 F (36.7 C)     Temp Source 11/05/23 1208 Oral     SpO2 11/05/23 1208 98 %     Weight --      Height --      Head Circumference --      Peak Flow --      Pain Score --      Pain Loc --      Pain Education --      Exclude from Growth Chart --    No data found.  Updated Vital Signs BP 116/78 (BP Location: Left Arm)   Pulse 67   Temp 98 F (36.7 C) (Oral)   Resp 18   LMP 10/01/2023   SpO2 98%    Physical Exam Vitals and nursing note reviewed.  Constitutional:      Appearance: She is not ill-appearing.  HENT:     Right Ear: Tympanic membrane and ear canal normal.     Left Ear: Tympanic membrane and ear canal normal.     Nose:  No congestion  or rhinorrhea.     Mouth/Throat:     Mouth: Mucous membranes are moist.     Pharynx: Oropharynx is clear. No posterior oropharyngeal erythema.  Eyes:     Conjunctiva/sclera: Conjunctivae normal.  Cardiovascular:     Rate and Rhythm: Normal rate and regular rhythm.     Pulses: Normal pulses.     Heart sounds: Normal heart sounds.  Pulmonary:     Effort: Pulmonary effort is normal.     Breath sounds: Wheezing (end expiration) present.  Musculoskeletal:     Cervical back: Normal range of motion.  Lymphadenopathy:     Cervical: No cervical adenopathy.  Skin:    General: Skin is warm and dry.  Neurological:     Mental Status: She is alert and oriented to person, place, and time.     UC Treatments / Results  Labs (all labs ordered are listed, but only abnormal results are displayed) Labs Reviewed  POC SARS CORONAVIRUS 2 AG -  ED - Abnormal; Notable for the following components:      Result Value   SARS Coronavirus 2 Ag Positive (*)    All other components within normal limits    EKG  Radiology No results found.  Procedures Procedures  Medications Ordered in UC Medications - No data to display  Initial Impression / Assessment and Plan / UC Course  I have reviewed the triage vital signs and the nursing notes.  Pertinent labs & imaging results that were available during my care of the patient were reviewed by me and considered in my medical decision making (see chart for details).  Confirmation rapid covid today is positive  End expiration wheezing. Offered nebulizer in clinic but patient reports she has machine at home. Has 2 days of prednisone  left. Requesting more cough syrup which is sent. Advised viral etiology of covid and allowing a few more days for improvement. Given that she has no further sore throat, no treatment is needed for the group B strep on culture. Advised reasons to return to clinic. Agrees to plan, no questions   Final Clinical  Impressions(s) / UC Diagnoses   Final diagnoses:  Lab test positive for detection of COVID-19 virus     Discharge Instructions      Continue your albuterol  treatments 3 times daily You can rinse your mouth out with water after each treatment  Continue and finish your prednisone   The promethazine  DM cough syrup can be used up to 4 times daily. If this medication makes you drowsy, take only once before bed.  Allow 4-5 more days for improvement in symptoms  Continue hydration and rest      ED Prescriptions     Medication Sig Dispense Auth. Provider   promethazine -dextromethorphan  (PROMETHAZINE -DM) 6.25-15 MG/5ML syrup Take 5 mLs by mouth 4 (four) times daily as needed for cough. 240 mL Derrich Gaby, [place], PA-C      PDMP not reviewed this encounter.   Jeryl Asberry, [place] 11/05/23 1316

## 2023-12-08 ENCOUNTER — Ambulatory Visit: Admitting: Gastroenterology

## 2023-12-08 ENCOUNTER — Ambulatory Visit: Admitting: Physician Assistant

## 2023-12-13 NOTE — Therapy (Incomplete)
 OUTPATIENT PHYSICAL THERAPY FEMALE PELVIC EVALUATION   Patient Name: Gloria Cox MRN: [id number] DOB:1995/07/26, 28 y.o., female Today's Date: 12/13/2023  END OF SESSION:   Past Medical History:  Diagnosis Date   Asthma    Dyspepsia    Gestational diabetes    Goiter    History of asthma 02/16/2021   Hyperlipemia 05/09/2013   Hypothyroidism    Macromastia 02/20/2014   Migraine without aura and without status migrainosus, not intractable 02/14/2015   P[place]    Pre-diabetes    Status post repeat low transverse cesarean section 09/06/2021   Striae    Thyroiditis, autoimmune    Past Surgical History:  Procedure Laterality Date   CESAREAN SECTION N/A 09/06/2021   Procedure: CESAREAN SECTION;  Surgeon: Izell Harari, MD;  Location: [facility];  Service: Obstetrics;  Laterality: N/A;   CESAREAN SECTION N/A 10/29/2022   Procedure: CESAREAN SECTION;  Surgeon: Eldonna Suzen Octave, MD;  Location: [facility];  Service: Obstetrics;  Laterality: N/A;   CHOLECYSTECTOMY N/A 12/18/2015   Procedure: LAPAROSCOPIC CHOLECYSTECTOMY;  Surgeon: Donnice Bury, MD;  Location: [facility];  Service: General;  Laterality: N/A;   WISDOM TOOTH EXTRACTION  03/30/2012   Patient Active Problem List   Diagnosis Date Noted   Nexplanon  in place 02/18/2023   Gestational diabetes mellitus (GDM) in third trimester controlled on oral hypoglycemic drug 10/28/2022   Tobacco use 12/17/2021   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  Rationale for Evaluation and Treatment: Rehabilitation  ONSET DATE: ***  SUBJECTIVE:                                                                                                                                                                                            SUBJECTIVE STATEMENT: *** Fluid intake:   FUNCTIONAL LIMITATIONS: ***  PERTINENT HISTORY:  Medications for current condition: *** Surgeries: *** Other: *** Sexual abuse: {Yes/No:304960894}  DIAGNOSTIC FINDINGS:  Post-void residual: Voiding Cystourethrogram (VCUG):  Ultrasound: PAIN:  Are you having pain? {yes/no:20286} NPRS scale: ***/10 Pain location: {pelvic pain location:27098}  Pain type: {type:313116} Pain description: {PAIN DESCRIPTION:21022940}   Aggravating factors: *** Relieving factors: ***  PRECAUTIONS: {Therapy precautions:24002}  RED FLAGS: {PT Red Flags:29287}   WEIGHT BEARING RESTRICTIONS: {Yes ***/No:24003}  FALLS:  Has patient fallen in last 6 months? {fallsyesno:27318}  OCCUPATION: ***  ACTIVITY LEVEL : ***  PLOF: {PLOF:24004}  PATIENT GOALS: ***  BOWEL MOVEMENT: Pain with bowel movement: {yes/no:20286} Type of bowel movement:{PT BM type:27100} Fully empty rectum: {No/Yes:304960894} Leakage: {Yes/No:304960894}                                                     Caused by: *** Pads: {Yes/No:304960894} Fiber supplement/laxative {YES/NO AS:20300}  URINATION: Pain with urination: {yes/no:20286} Fully empty bladder: {Yes/No:304960894}***                                Post-void dribble: {YES/NO AS:20300} Stream: {PT urination:27102} Urgency: {YES/NO AS:20300} Frequency:during the day ***                                                         Nocturia: {Yes/No:304960894}***   Leakage: {PT leakage:27103} Pads/briefs: {Yes/No:304960894}  INTERCOURSE:  Ability to have vaginal penetration {YES/NO:21197} Pain with intercourse: {pain with intercourse PA:27099} Dryness: {YES/NO AS:20300} Climax: *** Marinoff Scale: ***/3 Lubricant:  PREGNANCY: Vaginal deliveries *** Tearing {Yes***/No:304960894} Episiotomy {YES/NO AS:20300} C-section deliveries *** Currently pregnant  {Yes***/No:304960894}  PROLAPSE: {PT prolapse:27101}   OBJECTIVE:  Note: Objective measures were completed at Evaluation unless otherwise noted.  DIAGNOSTIC FINDINGS:  ***  PATIENT SURVEYS:  {rehab surveys:24030}  PFIQ-7: ***  COGNITION: Overall cognitive status: {cognition:24006}     SENSATION: Light touch: {intact/deficits:24005}  LUMBAR SPECIAL TESTS:  {lumbar special test:25242}  FUNCTIONAL TESTS:  {Functional tests:24029} Single leg stance:  Rt:  Lt: Sit-up test: Squat: Bed mobility:  GAIT: Assistive device utilized: {Assistive devices:23999} Comments: ***  POSTURE: {posture:25561}   LUMBARAROM/PROM:  A/PROM A/PROM  Eval (% available)  Flexion   Extension   Right lateral flexion   Left lateral flexion   Right rotation   Left rotation    (Blank rows = not tested)  LOWER EXTREMITY ROM:  {AROM/PROM:27142} ROM Right eval Left eval  Hip flexion    Hip extension    Hip abduction    Hip adduction    Hip internal rotation    Hip external rotation    Knee flexion    Knee extension    Ankle dorsiflexion    Ankle plantarflexion    Ankle inversion    Ankle eversion     (Blank rows = not tested)  LOWER EXTREMITY MMT:  MMT Right eval Left eval  Hip flexion    Hip extension    Hip abduction    Hip adduction    Hip internal rotation    Hip external rotation    Knee flexion    Knee extension    Ankle dorsiflexion    Ankle plantarflexion    Ankle inversion    Ankle eversion     (Blank rows = not tested) PALPATION:  General: ***  Pelvic Alignment: ***  Abdominal: ***  Diastasis: {Yes/No:304960894}*** Distortion: {YES/NO AS:20300}  Breathing: *** Scar tissue: {Yes/No:304960894}***                External Perineal Exam: ***                             Internal Pelvic Floor: ***  Patient confirms identification and approves PT to assess internal pelvic floor and treatment {yes/no:20286}  PELVIC MMT:   MMT eval  Vaginal    Internal Anal Sphincter   External Anal Sphincter   Puborectalis   Diastasis Recti   (Blank rows = not tested)        TONE: ***  PROLAPSE: ***  TODAY'S TREATMENT:                                                                                                                              DATE: ***  EVAL ***   PATIENT EDUCATION:  Education details: *** Person educated: {Person educated:25204} Education method: {Education Method:25205} Education comprehension: {Education Comprehension:25206}  HOME EXERCISE PROGRAM: ***  ASSESSMENT:  CLINICAL IMPRESSION: Patient is a *** y.o. *** who was seen today for physical therapy evaluation and treatment for ***.   OBJECTIVE IMPAIRMENTS: {opptimpairments:25111}.   ACTIVITY LIMITATIONS: {activitylimitations:27494}  PARTICIPATION LIMITATIONS: {participationrestrictions:25113}  PERSONAL FACTORS: {Personal factors:25162} are also affecting patient's functional outcome.   REHAB POTENTIAL: {rehabpotential:25112}  CLINICAL DECISION MAKING: {clinical decision making:25114}  EVALUATION COMPLEXITY: {Evaluation complexity:25115}   GOALS: Goals reviewed with patient? {yes/no:20286}  SHORT TERM GOALS: Target date: ***  *** Baseline: Goal status: INITIAL  2.  *** Baseline:  Goal status: INITIAL  3.  *** Baseline:  Goal status: INITIAL  4.  *** Baseline:  Goal status: INITIAL  5.  *** Baseline:  Goal status: INITIAL  6.  *** Baseline:  Goal status: INITIAL  LONG TERM GOALS: Target date: ***  *** Baseline:  Goal status: INITIAL  2.  *** Baseline:  Goal status: INITIAL  3.  *** Baseline:  Goal status: INITIAL  4.  *** Baseline:  Goal status: INITIAL  5.  *** Baseline:  Goal status: INITIAL  6.  *** Baseline:  Goal status: INITIAL  PLAN:  PT FREQUENCY: {rehab frequency:25116}  PT DURATION: {rehab duration:25117}  PLANNED INTERVENTIONS: {rehab planned interventions:25118::97110-Therapeutic  exercises,97530- Therapeutic [phone]- Neuromuscular re-education,97535- Self Rjmz,02859- Manual therapy,Patient/Family education}  PLAN FOR NEXT SESSION: ***   Gricelda Foland, PT 12/13/2023, 2:30 PM

## 2023-12-14 ENCOUNTER — Telehealth: Payer: Self-pay | Admitting: Physical Therapy

## 2023-12-14 ENCOUNTER — Ambulatory Visit: Attending: Family Medicine | Admitting: Physical Therapy

## 2023-12-14 NOTE — Telephone Encounter (Signed)
 Left patient a VM re no show for PT eval- patient to call us  if she has questions about pelvic PT or if she wants to schedule another eval.

## 2024-02-09 ENCOUNTER — Ambulatory Visit: Admitting: Obstetrics
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
## 2024-02-16 ENCOUNTER — Ambulatory Visit (INDEPENDENT_AMBULATORY_CARE_PROVIDER_SITE_OTHER): Admitting: Obstetrics

## 2024-02-16 ENCOUNTER — Encounter: Payer: Self-pay | Admitting: Obstetrics

## 2024-02-16 ENCOUNTER — Other Ambulatory Visit (HOSPITAL_COMMUNITY)
Admission: RE | Admit: 2024-02-16 | Discharge: 2024-02-16 | Disposition: A | Discharge status: Home / Self Care | Source: Ambulatory Visit | Attending: Obstetrics | Admitting: Obstetrics

## 2024-02-16 VITALS — BP 112/66 | HR 71 | Ht 62.6 in | Wt 231.8 lb

## 2024-02-16 DIAGNOSIS — N898 Other specified noninflammatory disorders of vagina: Secondary | ICD-10-CM

## 2024-02-16 DIAGNOSIS — R102 Pelvic and perineal pain unspecified side: Secondary | ICD-10-CM | POA: Diagnosis not present

## 2024-02-16 DIAGNOSIS — N941 Unspecified dyspareunia: Secondary | ICD-10-CM | POA: Diagnosis not present

## 2024-02-16 DIAGNOSIS — R351 Nocturia: Secondary | ICD-10-CM

## 2024-02-16 DIAGNOSIS — N393 Stress incontinence (female) (male): Secondary | ICD-10-CM

## 2024-02-16 DIAGNOSIS — K5909 Other constipation: Secondary | ICD-10-CM | POA: Diagnosis not present

## 2024-02-16 LAB — POCT URINALYSIS DIP (CLINITEK)
Bilirubin, UA: NEGATIVE
Glucose, UA: NEGATIVE mg/dL
Leukocytes, UA: NEGATIVE
Nitrite, UA: NEGATIVE
POC PROTEIN,UA: NEGATIVE
Spec Grav, UA: 1.025 (ref 1.010–1.025)
Urobilinogen, UA: 0.2 U/dL
pH, UA: 7.5 (ref 5.0–8.0)

## 2024-02-16 NOTE — Assessment & Plan Note (Signed)
-   Type I stool 3-7x/week with straining - uses 2 fiber gummy 5x/week - For constipation, we reviewed the importance of a better bowel regimen.  We also discussed the importance of avoiding chronic straining, as it can exacerbate her pelvic floor symptoms; we discussed treating constipation and straining prior to surgery, as postoperative straining can lead to damage to the repair and recurrence of symptoms. We discussed initiating therapy with increasing fluid intake, fiber supplementation, stool softeners, and laxatives such as miralax .  - encouraged to increase frequency and dose of fiber supplementation  - continue squatting for defecation to ensure pelvic floor relaxation - encouraged pelvic floor PT

## 2024-02-16 NOTE — Assessment & Plan Note (Signed)
-   currently on cycle - pending Nuswab to r/o infectious etiology

## 2024-02-16 NOTE — Assessment & Plan Note (Addendum)
-   bilateral SI joint pain and pain with palpation of pubic symphysis. Reproducible pain with palpation of apices over Pfannenstiel incision - encouraged abdominal binder to reduce ilioinguinal nerve irritation and pelvic binder for pelvic girdle pain - encouraged sleeping with pillow between her legs - start topical Voltaren gel 2g up to 4x/day PRN pain, Tylenol  PRN and continue warm compress/baths  - encouraged pelvic floor PT  - consider topical lidocaine  1g PRN pain up to 3x/day or Amitriptyline 2.5%/ gabapentin  2.5%/ baclofen 2.5% in vaginal cream from compounding pharmacy for use daily - exacerbation during irregular cycles, encouraged scheduled NSAID/Tylenol  at onset of cycles. Encouraged to consider alternative birth control method for cycle control if refractory symptoms - For irritative bladder we reviewed treatment options including altering her diet to avoid irritative beverages and foods as well as attempting to decrease stress and other exacerbating factors.  We also discussed using pyridium and similar over-the-counter medications for pain relief as needed. We discussed the pentad of medications including Tums, an antihistamine such as Vistaril, amitriptyline, and L-arginine.  We also discussed in-office bladder instillations for pain flares, as well as cystoscopy with hydrodistention in the operating room, which can be both diagnostic and therapeutic.

## 2024-02-16 NOTE — Patient Instructions (Signed)
 The origin of pelvic floor muscle spasm can be multifactorial, including primary, reactive to a different pain source, trauma, or even part of a centralized pain syndrome.Treatment options include pelvic floor physical therapy, local (vaginal) or oral  muscle relaxants, pelvic muscle trigger point injections or centrally acting pain medications.     Resume use of pelvic binder during the day.  Use up to 2g Voltaren gel 4 times a day as needed for discomfort.   Continue warm compression as needed for pain.   Continue efforts for weight reduction.  Please call [phone] to schedule the earliest appointment for pelvic floor PT.   For treatment of stress urinary incontinence, which is leakage with physical activity/movement/strainging/coughing, we discussed expectant management versus nonsurgical options versus surgery. Nonsurgical options include weight loss, physical therapy, as well as a pessary.  Surgical options include a midurethral sling, which is a synthetic mesh sling that acts like a hammock under the urethra to prevent leakage of urine, a Burch urethropexy, and transurethral injection of a bulking agent.   For night time frequency: - avoid fluid intake 3 hours before bedtime - due to snoring, consider sleep study to rule out sleep apnea  Constipation: Our goal is to achieve formed bowel movements daily or every-other-day.  You may need to try different combinations of the following options to find what works best for you - everybody's body works differently so feel free to adjust the dosages as needed.  Some options to help maintain bowel health include:  Dietary changes (more leafy greens, vegetables and fruits; less processed foods) Fiber supplementation (Benefiber, FiberCon, Metamucil or Psyllium). Start slow and increase gradually to full dose. Over-the-counter agents such as: stool softeners (Docusate or Colace) and/or laxatives (Miralax , milk of magnesia)  Power Pudding is  a natural mixture that may help your constipation.  To make blend 1 cup applesauce, 1 cup wheat bran, and 3/4 cup prune juice, refrigerate and then take 1 tablespoon daily with a large glass of water as needed.   Women should try to eat at least 21 to 25 grams of fiber a day, while men should aim for 30 to 38 grams a day. You can add fiber to your diet with food or a fiber supplement such as psyllium (metamucil), benefiber, or fibercon.   Here's a look at how much dietary fiber is found in some common foods. When buying packaged foods, check the Nutrition Facts label for fiber content. It can vary among brands.  Fruits Serving size Total fiber (grams)*  Raspberries 1 cup 8.0  Pear 1 medium 5.5  Apple, with skin 1 medium 4.5  Banana 1 medium 3.0  Orange 1 medium 3.0  Strawberries 1 cup 3.0   Vegetables Serving size Total fiber (grams)*  Green peas, boiled 1 cup 9.0  Broccoli, boiled 1 cup chopped 5.0  Turnip greens, boiled 1 cup 5.0  Brussels sprouts, boiled 1 cup 4.0  Potato, with skin, baked 1 medium 4.0  Sweet corn, boiled 1 cup 3.5  Cauliflower, raw 1 cup chopped 2.0  Carrot, raw 1 medium 1.5   Grains Serving size Total fiber (grams)*  Spaghetti, whole-wheat, cooked 1 cup 6.0  Barley, pearled, cooked 1 cup 6.0  Bran flakes 3/4 cup 5.5  Quinoa, cooked 1 cup 5.0  Oat bran muffin 1 medium 5.0  Oatmeal, instant, cooked 1 cup 5.0  Popcorn, air-popped 3 cups 3.5  Brown rice, cooked 1 cup 3.5  Bread, whole-wheat 1 slice 2.0  Bread, rye 1  slice 2.0   Legumes, nuts and seeds Serving size Total fiber (grams)*  Split peas, boiled 1 cup 16.0  Lentils, boiled 1 cup 15.5  Black beans, boiled 1 cup 15.0  Baked beans, canned 1 cup 10.0  Chia seeds 1 ounce 10.0  Almonds 1 ounce (23 nuts) 3.5  Pistachios 1 ounce (49 nuts) 3.0  Sunflower kernels 1 ounce 3.0  *Rounded to nearest 0.5 gram. Source: [employer] for [employer], Legacy Release    For irritative  bladder we reviewed treatment options including altering your diet to avoid irritative beverages and foods as well as attempting to decrease stress and other exacerbating factors.  We also discussed using pyridium and similar over-the-counter medications for pain relief as needed. We discussed the pentad of medications including Tums, an antihistamine such as Vistaril, amitriptyline, and L-arginine.  We also discussed in-office bladder instillations for pain flares, as well as cystoscopy with hydrodistention in the operating room, which can be both diagnostic and therapeutic. She was also given information on the IC Network at https://www.ic-network.com for bladder diet suggestions and patient forums for support.   Today we talked about ways to manage bladder urgency such as altering your diet to avoid irritative beverages and foods (bladder diet) as well as attempting to decrease stress and other exacerbating factors.  You can also chew a plain Tums 1-3 times per day to make your urine less acidic, especially if you have eating/drinking acidic things.   There is a website with helpful information for people with bladder irritation, called the IC Network at https://www.ic-network.com. This website has more information about a healthy bladder diet and patient forums for support.  The Most Bothersome Foods* The Least Bothersome Foods*  Coffee - Regular & Decaf Tea - caffeinated Carbonated beverages - cola, non-colas, diet & caffeine-free Alcohols - Beer, Red Wine, White Wine, [address] - Grapefruit, [place], Orange, [employer] - Cranberry, Grapefruit, Orange, Pineapple Vegetables - Tomato & Tomato Products Flavor Enhancers - Hot peppers, Spicy foods, Chili, Horseradish, Vinegar, Monosodium glutamate (MSG) Artificial Sweeteners - NutraSweet, Sweet 'N Low, Equal (sweetener), Saccharin Ethnic foods - Mexican, Thai, Indian food [employer] - low-fat & whole Fruits - Bananas, Blueberries,  Honeydew melon, Pears, Raisins, Watermelon Vegetables - Broccoli, [address] Sprouts, [place], Carrots, Cauliflower, [place], Cucumber, Mushrooms, Peas, Radishes, Squash, Zucchini, White potatoes, Sweet potatoes & yams Poultry - Chicken, Eggs, Turkey, [employer] - Beef, [employer], Lamb Seafood - Shrimp, [place] fish, Salmon Grains - Oat, Rice Snacks - Pretzels, Popcorn  *Mitch ALF et al. Diet and its role in interstitial cystitis/bladder pain syndrome (IC/BPS) and comorbid conditions. [employer]. [employer] Int. 2012 Jan 11.

## 2024-02-16 NOTE — Assessment & Plan Note (Signed)
-   reports nighttime frequency since childhood - avoid fluid intake 3 hours before bedtime - due to snoring, consider sleep study to rule out sleep apnea - encouraged weight reduction and fluid management

## 2024-02-16 NOTE — Assessment & Plan Note (Signed)
-   POCT + heme (on cycle), PVR 9mL - For treatment of stress urinary incontinence,  non-surgical options include expectant management, weight loss, physical therapy, as well as a pessary.  Surgical options include a midurethral sling, Burch urethropexy, and transurethral injection of a bulking agent. - encouraged to optimize stool consistency, fluid management, and pelvic floor PT
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
## 2024-02-17 ENCOUNTER — Ambulatory Visit: Payer: Self-pay | Admitting: Obstetrics

## 2024-02-17 DIAGNOSIS — R102 Pelvic and perineal pain unspecified side: Secondary | ICD-10-CM

## 2024-02-17 DIAGNOSIS — N898 Other specified noninflammatory disorders of vagina: Secondary | ICD-10-CM

## 2024-02-17 LAB — CERVICOVAGINAL ANCILLARY ONLY
Bacterial Vaginitis (gardnerella): NEGATIVE
Candida Glabrata: POSITIVE — AB
Candida Vaginitis: NEGATIVE
Comment: NEGATIVE
Comment: NEGATIVE
Comment: NEGATIVE

## 2024-02-17 MED ORDER — BORIC ACID 600 MG VA SUPP: 1.0000 | Freq: Every evening | VAGINAL | 0 refills | Status: AC

## 2024-04-10 ENCOUNTER — Encounter: Payer: Self-pay | Admitting: *Deleted

## 2024-05-15 ENCOUNTER — Ambulatory Visit: Admitting: Obstetrics
